# Patient Record
Sex: Female | Born: 1960 | Race: Black or African American | Hispanic: No | Marital: Married | Smoking: Never smoker
Health system: Southern US, Community
[De-identification: ages and names within clinical notes are randomized; demographics above are authoritative.]

## PROBLEM LIST (undated history)

## (undated) DIAGNOSIS — D649 Anemia, unspecified: Secondary | ICD-10-CM

## (undated) DIAGNOSIS — A159 Respiratory tuberculosis unspecified: Secondary | ICD-10-CM

## (undated) DIAGNOSIS — J189 Pneumonia, unspecified organism: Secondary | ICD-10-CM

## (undated) DIAGNOSIS — F419 Anxiety disorder, unspecified: Secondary | ICD-10-CM

## (undated) DIAGNOSIS — I10 Essential (primary) hypertension: Secondary | ICD-10-CM

## (undated) DIAGNOSIS — M1990 Unspecified osteoarthritis, unspecified site: Secondary | ICD-10-CM

## (undated) HISTORY — PX: OTHER SURGICAL HISTORY: SHX169

## (undated) HISTORY — PX: ABDOMINAL HYSTERECTOMY: SHX81

## (undated) HISTORY — PX: REPLACEMENT TOTAL KNEE: SUR1224

## (undated) HISTORY — PX: TUBAL LIGATION: SHX77

## (undated) HISTORY — DX: Essential (primary) hypertension: I10

---

## 2000-02-04 ENCOUNTER — Other Ambulatory Visit: Admission: RE | Admit: 2000-02-04 | Discharge: 2000-02-04 | Payer: Self-pay | Admitting: Unknown Physician Specialty

## 2001-01-19 DIAGNOSIS — I252 Old myocardial infarction: Secondary | ICD-10-CM

## 2001-05-02 ENCOUNTER — Encounter: Admission: RE | Admit: 2001-05-02 | Discharge: 2001-05-02 | Payer: Self-pay | Admitting: Internal Medicine

## 2001-05-02 ENCOUNTER — Encounter: Payer: Self-pay | Admitting: Internal Medicine

## 2003-11-08 ENCOUNTER — Ambulatory Visit: Payer: Self-pay | Admitting: Family Medicine

## 2003-12-20 LAB — CONVERTED CEMR LAB: Pap Smear: NORMAL

## 2004-01-20 HISTORY — PX: PARTIAL HYSTERECTOMY: SHX80

## 2004-02-04 ENCOUNTER — Inpatient Hospital Stay (HOSPITAL_COMMUNITY): Admission: RE | Admit: 2004-02-04 | Discharge: 2004-02-07 | Payer: Self-pay | Admitting: Obstetrics and Gynecology

## 2004-02-04 ENCOUNTER — Encounter (INDEPENDENT_AMBULATORY_CARE_PROVIDER_SITE_OTHER): Payer: Self-pay | Admitting: Specialist

## 2004-07-01 ENCOUNTER — Ambulatory Visit: Payer: Self-pay | Admitting: Family Medicine

## 2004-12-15 ENCOUNTER — Ambulatory Visit: Payer: Self-pay | Admitting: Family Medicine

## 2004-12-16 ENCOUNTER — Ambulatory Visit: Payer: Self-pay | Admitting: Family Medicine

## 2005-01-26 ENCOUNTER — Ambulatory Visit: Payer: Self-pay | Admitting: Family Medicine

## 2005-02-10 ENCOUNTER — Ambulatory Visit: Payer: Self-pay | Admitting: Family Medicine

## 2005-02-17 ENCOUNTER — Ambulatory Visit: Payer: Self-pay | Admitting: Family Medicine

## 2005-02-24 ENCOUNTER — Ambulatory Visit: Payer: Self-pay | Admitting: Internal Medicine

## 2006-09-06 ENCOUNTER — Encounter: Payer: Self-pay | Admitting: Internal Medicine

## 2006-09-06 DIAGNOSIS — I10 Essential (primary) hypertension: Secondary | ICD-10-CM | POA: Insufficient documentation

## 2006-09-29 ENCOUNTER — Ambulatory Visit: Payer: Self-pay | Admitting: Family Medicine

## 2006-09-29 ENCOUNTER — Encounter (INDEPENDENT_AMBULATORY_CARE_PROVIDER_SITE_OTHER): Payer: Self-pay | Admitting: Internal Medicine

## 2006-09-29 DIAGNOSIS — M2550 Pain in unspecified joint: Secondary | ICD-10-CM

## 2006-10-07 LAB — CONVERTED CEMR LAB
BUN: 11 mg/dL (ref 6–23)
Creatinine, Ser: 0.8 mg/dL (ref 0.4–1.2)
GFR calc non Af Amer: 82 mL/min
HCT: 34.9 % — ABNORMAL LOW (ref 36.0–46.0)
Hemoglobin: 11.6 g/dL — ABNORMAL LOW (ref 12.0–15.0)
MCHC: 33.2 g/dL (ref 30.0–36.0)
MCV: 74.2 fL — ABNORMAL LOW (ref 78.0–100.0)
Neutrophils Relative %: 68.7 % (ref 43.0–77.0)
Potassium: 4 meq/L (ref 3.5–5.1)
RDW: 13.6 % (ref 11.5–14.6)
Rhuematoid fact SerPl-aCnc: 70 intl units/mL — ABNORMAL HIGH (ref 0.0–20.0)
Sed Rate: 10 mm/hr (ref 0–25)

## 2006-10-11 ENCOUNTER — Ambulatory Visit: Payer: Self-pay | Admitting: Family Medicine

## 2006-10-12 LAB — CONVERTED CEMR LAB: Anti Nuclear Antibody(ANA): NEGATIVE

## 2006-10-26 ENCOUNTER — Encounter (INDEPENDENT_AMBULATORY_CARE_PROVIDER_SITE_OTHER): Payer: Self-pay | Admitting: Internal Medicine

## 2006-11-11 ENCOUNTER — Ambulatory Visit: Payer: Self-pay | Admitting: Family Medicine

## 2007-04-26 ENCOUNTER — Ambulatory Visit: Payer: Self-pay | Admitting: Family Medicine

## 2007-05-02 LAB — CONVERTED CEMR LAB
CO2: 28 meq/L (ref 19–32)
Cholesterol: 239 mg/dL (ref 0–200)
Direct LDL: 160.9 mg/dL
Total CHOL/HDL Ratio: 3.3

## 2007-05-24 ENCOUNTER — Encounter: Admission: RE | Admit: 2007-05-24 | Discharge: 2007-05-24 | Payer: Self-pay | Admitting: Family Medicine

## 2007-05-26 ENCOUNTER — Encounter (INDEPENDENT_AMBULATORY_CARE_PROVIDER_SITE_OTHER): Payer: Self-pay | Admitting: *Deleted

## 2007-05-26 ENCOUNTER — Encounter (INDEPENDENT_AMBULATORY_CARE_PROVIDER_SITE_OTHER): Payer: Self-pay | Admitting: Internal Medicine

## 2007-07-28 ENCOUNTER — Other Ambulatory Visit: Admission: RE | Admit: 2007-07-28 | Discharge: 2007-07-28 | Payer: Self-pay | Admitting: Family Medicine

## 2007-07-28 ENCOUNTER — Encounter (INDEPENDENT_AMBULATORY_CARE_PROVIDER_SITE_OTHER): Payer: Self-pay | Admitting: Internal Medicine

## 2007-07-28 ENCOUNTER — Ambulatory Visit: Payer: Self-pay | Admitting: Family Medicine

## 2007-07-28 LAB — CONVERTED CEMR LAB: Pap Smear: NORMAL

## 2007-08-03 ENCOUNTER — Encounter (INDEPENDENT_AMBULATORY_CARE_PROVIDER_SITE_OTHER): Payer: Self-pay | Admitting: *Deleted

## 2007-08-03 ENCOUNTER — Encounter (INDEPENDENT_AMBULATORY_CARE_PROVIDER_SITE_OTHER): Payer: Self-pay | Admitting: Internal Medicine

## 2008-01-18 ENCOUNTER — Encounter (INDEPENDENT_AMBULATORY_CARE_PROVIDER_SITE_OTHER): Payer: Self-pay | Admitting: *Deleted

## 2008-01-24 ENCOUNTER — Ambulatory Visit: Payer: Self-pay | Admitting: Family Medicine

## 2008-01-24 DIAGNOSIS — E785 Hyperlipidemia, unspecified: Secondary | ICD-10-CM | POA: Insufficient documentation

## 2008-01-27 ENCOUNTER — Ambulatory Visit: Payer: Self-pay | Admitting: Family Medicine

## 2008-02-03 LAB — CONVERTED CEMR LAB
ALT: 16 units/L (ref 0–35)
AST: 20 units/L (ref 0–37)
Albumin: 3.9 g/dL (ref 3.5–5.2)
Alkaline Phosphatase: 45 units/L (ref 39–117)
BUN: 15 mg/dL (ref 6–23)
CO2: 27 meq/L (ref 19–32)
Chloride: 107 meq/L (ref 96–112)
Cholesterol: 231 mg/dL (ref 0–200)
Creatinine, Ser: 0.8 mg/dL (ref 0.4–1.2)
Direct LDL: 139.1 mg/dL
Glucose, Bld: 83 mg/dL (ref 70–99)
Total Protein: 7.3 g/dL (ref 6.0–8.3)
VLDL: 11 mg/dL (ref 0–40)

## 2008-04-26 ENCOUNTER — Ambulatory Visit: Payer: Self-pay | Admitting: Family Medicine

## 2008-08-09 ENCOUNTER — Ambulatory Visit: Payer: Self-pay | Admitting: Family Medicine

## 2008-08-09 DIAGNOSIS — R635 Abnormal weight gain: Secondary | ICD-10-CM | POA: Insufficient documentation

## 2008-08-09 LAB — CONVERTED CEMR LAB
BUN: 14 mg/dL (ref 6–23)
Chloride: 108 meq/L (ref 96–112)
Glucose, Bld: 87 mg/dL (ref 70–99)
Potassium: 3.6 meq/L (ref 3.5–5.1)
Sodium: 143 meq/L (ref 135–145)

## 2008-10-04 ENCOUNTER — Ambulatory Visit: Payer: Self-pay | Admitting: Family Medicine

## 2009-03-28 ENCOUNTER — Ambulatory Visit: Payer: Self-pay | Admitting: Family Medicine

## 2009-03-28 DIAGNOSIS — Z780 Asymptomatic menopausal state: Secondary | ICD-10-CM | POA: Insufficient documentation

## 2009-03-28 LAB — CONVERTED CEMR LAB
BUN: 15 mg/dL (ref 6–23)
Calcium: 9.2 mg/dL (ref 8.4–10.5)
Direct LDL: 128.8 mg/dL
GFR calc non Af Amer: 114.29 mL/min (ref >60)
Glucose, Bld: 94 mg/dL (ref 70–99)
HDL: 87.7 mg/dL (ref >39.00)
Potassium: 3.6 meq/L (ref 3.5–5.1)
Sodium: 142 meq/L (ref 135–145)
Triglycerides: 36 mg/dL (ref 0.0–149.0)
VLDL: 7.2 mg/dL (ref 0.0–40.0)

## 2009-04-04 ENCOUNTER — Encounter: Payer: Self-pay | Admitting: Family Medicine

## 2009-04-05 ENCOUNTER — Ambulatory Visit: Payer: Self-pay | Admitting: Family Medicine

## 2009-04-05 ENCOUNTER — Ambulatory Visit: Payer: Self-pay | Admitting: Cardiovascular Disease

## 2009-04-05 DIAGNOSIS — Z862 Personal history of diseases of the blood and blood-forming organs and certain disorders involving the immune mechanism: Secondary | ICD-10-CM

## 2009-04-05 DIAGNOSIS — R002 Palpitations: Secondary | ICD-10-CM

## 2009-04-05 DIAGNOSIS — R0602 Shortness of breath: Secondary | ICD-10-CM

## 2009-04-05 DIAGNOSIS — R5381 Other malaise: Secondary | ICD-10-CM | POA: Insufficient documentation

## 2009-04-05 DIAGNOSIS — R5383 Other fatigue: Secondary | ICD-10-CM

## 2009-04-05 LAB — CONVERTED CEMR LAB
Iron: 49 ug/dL (ref 42–145)
Transferrin: 209.1 mg/dL — ABNORMAL LOW (ref 212.0–360.0)

## 2009-04-10 LAB — CONVERTED CEMR LAB
Basophils Absolute: 0 10*3/uL (ref 0.0–0.1)
Hemoglobin: 11.4 g/dL — ABNORMAL LOW (ref 12.0–15.0)
Lymphocytes Relative: 19.9 % (ref 12.0–46.0)
Monocytes Relative: 11 % (ref 3.0–12.0)
Neutro Abs: 5 10*3/uL (ref 1.4–7.7)
RBC: 4.76 M/uL (ref 3.87–5.11)
RDW: 13.3 % (ref 11.5–14.6)
WBC: 7.8 10*3/uL (ref 4.5–10.5)

## 2009-07-09 ENCOUNTER — Ambulatory Visit: Payer: Self-pay | Admitting: Family Medicine

## 2009-07-10 LAB — CONVERTED CEMR LAB
Basophils Relative: 0.7 % (ref 0.0–3.0)
Eosinophils Absolute: 0.3 10*3/uL (ref 0.0–0.7)
Hemoglobin: 11.6 g/dL — ABNORMAL LOW (ref 12.0–15.0)
Lymphocytes Relative: 17.8 % (ref 12.0–46.0)
MCHC: 31.6 g/dL (ref 30.0–36.0)
Monocytes Relative: 8 % (ref 3.0–12.0)
Neutro Abs: 6.2 10*3/uL (ref 1.4–7.7)
RBC: 4.82 M/uL (ref 3.87–5.11)

## 2009-07-29 ENCOUNTER — Ambulatory Visit: Payer: Self-pay | Admitting: Family Medicine

## 2010-01-23 ENCOUNTER — Ambulatory Visit
Admission: RE | Admit: 2010-01-23 | Discharge: 2010-01-23 | Payer: Self-pay | Source: Home / Self Care | Attending: Family Medicine | Admitting: Family Medicine

## 2010-01-23 ENCOUNTER — Other Ambulatory Visit: Payer: Self-pay | Admitting: Family Medicine

## 2010-01-23 ENCOUNTER — Encounter: Payer: Self-pay | Admitting: Internal Medicine

## 2010-01-23 ENCOUNTER — Other Ambulatory Visit
Admission: RE | Admit: 2010-01-23 | Discharge: 2010-01-23 | Payer: Self-pay | Source: Home / Self Care | Admitting: Family Medicine

## 2010-01-23 LAB — CBC WITH DIFFERENTIAL/PLATELET
Basophils Absolute: 0 10*3/uL (ref 0.0–0.1)
Basophils Relative: 0.6 % (ref 0.0–3.0)
Eosinophils Absolute: 0.2 10*3/uL (ref 0.0–0.7)
Eosinophils Relative: 3 % (ref 0.0–5.0)
HCT: 36.4 % (ref 36.0–46.0)
Hemoglobin: 11.5 g/dL — ABNORMAL LOW (ref 12.0–15.0)
Lymphocytes Relative: 19.7 % (ref 12.0–46.0)
Lymphs Abs: 1.6 10*3/uL (ref 0.7–4.0)
MCHC: 31.7 g/dL (ref 30.0–36.0)
MCV: 75.7 fl — ABNORMAL LOW (ref 78.0–100.0)
Monocytes Absolute: 0.8 10*3/uL (ref 0.1–1.0)
Monocytes Relative: 10.3 % (ref 3.0–12.0)
Neutro Abs: 5.3 10*3/uL (ref 1.4–7.7)
Neutrophils Relative %: 66.4 % (ref 43.0–77.0)
Platelets: 227 10*3/uL (ref 150.0–400.0)
RBC: 4.81 Mil/uL (ref 3.87–5.11)
RDW: 14.6 % (ref 11.5–14.6)
WBC: 8 10*3/uL (ref 4.5–10.5)

## 2010-01-23 LAB — BASIC METABOLIC PANEL
BUN: 17 mg/dL (ref 6–23)
CO2: 29 mEq/L (ref 19–32)
Calcium: 9.5 mg/dL (ref 8.4–10.5)
Chloride: 100 mEq/L (ref 96–112)
Creatinine, Ser: 0.9 mg/dL (ref 0.4–1.2)
GFR: 86.34 mL/min (ref >60.00)
Glucose, Bld: 86 mg/dL (ref 70–99)
Potassium: 3.5 mEq/L (ref 3.5–5.1)
Sodium: 138 mEq/L (ref 135–145)

## 2010-01-23 LAB — LIPID PANEL
Cholesterol: 255 mg/dL — ABNORMAL HIGH (ref 0–200)
HDL: 76.7 mg/dL (ref >39.00)
Total CHOL/HDL Ratio: 3
Triglycerides: 55 mg/dL (ref 0.0–149.0)
VLDL: 11 mg/dL (ref 0.0–40.0)

## 2010-01-23 LAB — HEPATIC FUNCTION PANEL
ALT: 22 U/L (ref 0–35)
AST: 19 U/L (ref 0–37)
Albumin: 3.7 g/dL (ref 3.5–5.2)
Alkaline Phosphatase: 48 U/L (ref 39–117)
Bilirubin, Direct: 0.1 mg/dL (ref 0.0–0.3)
Total Bilirubin: 0.7 mg/dL (ref 0.3–1.2)
Total Protein: 6.7 g/dL (ref 6.0–8.3)

## 2010-01-23 LAB — LDL CHOLESTEROL, DIRECT: Direct LDL: 158.1 mg/dL

## 2010-01-30 ENCOUNTER — Encounter
Admission: RE | Admit: 2010-01-30 | Discharge: 2010-01-30 | Payer: Self-pay | Source: Home / Self Care | Attending: Family Medicine | Admitting: Family Medicine

## 2010-01-31 ENCOUNTER — Encounter: Payer: Self-pay | Admitting: Family Medicine

## 2010-02-18 NOTE — Assessment & Plan Note (Signed)
Summary: NP6/AMD   Visit Type:  New Patient Referring Provider:  Dayton Martes Primary Provider:  Ruthe Mannan, MD  CC:  minute chest pains, lots of indigestion, increased shortness of breath, and no edema in akles and feet.Marland Kitchen  History of Present Illness: Erin Bass is a 50 year old woman with history of hypertension, hyperlipidemia, remote episode of chest pain when she was age 37 at which time she did not seek medical attention, who presents by referral for evaluation of recent shortness of breath and palpitations.  This Skillman states that recently she has had some shortness of breath when she exerts herself such as when she goes upstairs or tries to walk too fast. She also has some extra beats sometimes they are slow, other times a little but rapid. He seemed to be short episodes and not always associated with exertion. She had headaches, eyes are red and wonders if some of this could be from menopause.  She has been sleeping well but does get woken with her headaches. She's had no real episodes of chest discomfort with exertion. No lower extremity edema, no lightheadedness or near syncope.  Current Problems (verified): 1)  Postmenopausal Status  (ICD-V49.81) 2)  Weight Gain  (ICD-783.1) 3)  Hyperlipidemia  (ICD-272.4) 4)  Health Screening  (ICD-V70.0) 5)  Pain in Joint, Multiple Sites  (ICD-719.49) 6)  Hx, Family, Ischemic Heart Disease  (ICD-V17.3) 7)  Hx, Family, Sudden Cardiac Death  (ICD-V17.41) 8)  Hypertension  (ICD-401.9) 9)  Myocardial Infarction, Hx of  (ICD-412)  Current Medications (verified): 1)  Bl Essential One Daily   Tabs (Multiple Vitamin) .... Take 1 Tablet By Mouth Once A Day 2)  Cvs Iron 325 (65 Fe) Mg  Tabs (Ferrous Sulfate) .... Take 1 Tablet By Mouth Once A Day (Sometimes) 3)  Dyazide 37.5-25 Mg  Caps (Triamterene-Hctz) .... Take 2 By Mouth Every Am For Bp 4)  Calcium Carbonate-Vitamin D 600-400 Mg-Unit  Tabs (Calcium Carbonate-Vitamin D) .... Take 2 Per Day 5)  Simvastatin  10 Mg Tabs (Simvastatin) .Marland Kitchen.. 1 By Mouth At Bedtime  Allergies (verified): No Known Drug Allergies  Past History:  Past Surgical History: Last updated: 09/06/2006  hysterectomy, partial-- fibroids-- ovaries left in-- 1/06 2 c' sections removal bone spur  Family History: Last updated: 09/29/2006 Father: living 69--CAD/bypass, depression, ETOH abuse Mother: living 66--DM, HBP Siblings: 2 br---1 died age 49--massive MI, taking Phen-Phen, depression                          1--L&W               1 sis--45--DM, depression  CVA--PGM DM--MGM, Maunt and uncle Ca--none depression--both sides ETOH/drugs--no  Social History: Last updated: 09/29/2006 Marital Status: Married Children: 2--1 in home 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  The patient denies fatigue, malaise, fever, weight gain/loss, vision loss, decreased hearing, hoarseness, chest pain, palpitations, shortness of breath, prolonged cough, wheezing, sleep apnea, coughing up blood, abdominal pain, blood in stool, nausea, vomiting, diarrhea, heartburn, incontinence, blood in urine, muscle weakness, joint pain, leg swelling, rash, skin lesions, headache, fainting, dizziness, depression, anxiety, enlarged lymph nodes, easy bruising or bleeding, and environmental allergies.  Palpitations  Vital Signs:  Patient profile:   50 year old female Height:      61 inches Weight:      176.75 pounds BMI:     33.52 Pulse rate:   80 / minute Pulse rhythm:   regular BP sitting:   120 / 92  (left arm) Cuff size:   large  Vitals Entered By: Erin Bass (April 05, 2009 11:46 AM)  Physical  Exam  General:  Young woman  in no apparent distress, neck is supple with no JVP or carotid bruits, heart sounds are regular with normal S1-S2 and no murmurs appreciated, lungs are clear to auscultation with no wheezes or rales, abdominal exam notable for moderate obesity but otherwise benign, no significant lower extremity edema, neurologic exam grossly nonfocal, pulses equal and symmetrical in his upper and lower extremities, skin is warm and dry    EKG  Procedure date:  04/04/2009  Findings:      EKG dated 04/04/09 shows normal sinus rhythm with left axis deviation, poor R-wave progression through the precordial leads.  Impression & Recommendations:  Problem # 1:  DYSPNEA (ICD-786.05) Etiology of her shortness of breath is uncertain. She has mild obesity, and may also be deconditioned. Her symptoms do not seem to be new and are mild. Her exam is essentially benign with no signs of heart failure, no valvular abnormalities auscultated. There is no significant chest discomfort or tightness on history.  She does mention having a heart attack though there is no documentation to support this. She states having any episode of chest pain that lasted for several minutes when she was age 68 though she did not see a physician at that time. She reports having an echo and a stress test and per her report a CT scan done at [facility] many years ago though these are not documented in chart.  we talked to her about the various options for management including medical management, echocardiography and stress testing. She will try low-dose metoprolol for her shortness of breath and palpitations. She would also try to start exercising to improve her conditioning. If she has worsening shortness of breath or any other symptoms, have asked her to contact me as we would order a stress test, and possibly an echocardiogram to rule out pulmonary hypertension.  We have suggested that she start taking aspirin 81 mg  daily. We will  start her on low-dose metoprolol as detailed below.  Her updated medication list for this problem includes:    Dyazide 37.5-25 Mg Caps (Triamterene-hctz) .Marland Kitchen... Take 2 by mouth every am for bp    Metoprolol Tartrate 25 Mg Tabs (Metoprolol tartrate) .Marland Kitchen... Take one tablet by mouth twice a day    Aspirin 81 Mg Tbec (Aspirin) .Marland Kitchen... Take one tablet by mouth daily  Problem # 2:  PALPITATIONS (ICD-785.1) For her  palpitations and shortness of breath, we have suggested that she try a low-dose beta blocker. Her heart rate was elevated when seen in Dr. Elmer Sow office. Mildly elevated on her visit today. We have suggested that she try metoprolol 12.5 mg b.i.d. titrating up to 25 mg b.i.d. needed for heart rates or for improved blood pressure control.  Her updated medication list for this problem includes:    Metoprolol Tartrate 25 Mg Tabs (Metoprolol tartrate) .Marland Kitchen... Take one tablet by mouth twice a day    Aspirin 81 Mg Tbec (Aspirin) .Marland Kitchen... Take one tablet by mouth daily  Problem # 3:  HYPERLIPIDEMIA (ICD-272.4) Recently started  on simvastatin and she is not pick this up yet.  Her updated medication list for this problem includes:    Simvastatin 10 Mg Tabs (Simvastatin) .Marland Kitchen... 1 by mouth at bedtime  Problem # 4:  HYPERTENSION (ICD-401.9) diastolic pressure is borderline elevated. I hope that this will improve on low-dose metoprolol. Have asked her to monitor her blood pressure as an outpatient.  Her updated medication list for this problem includes:    Dyazide 37.5-25 Mg Caps (Triamterene-hctz) .Marland Kitchen... Take 2 by mouth every am for bp    Metoprolol Tartrate 25 Mg Tabs (Metoprolol tartrate) .Marland Kitchen... Take one tablet by mouth twice a day    Aspirin 81 Mg Tbec (Aspirin) .Marland Kitchen... Take one tablet by mouth daily  Patient Instructions: 1)  Your physician recommends that you schedule a follow-up appointment in 3 months. 2)  Your physician has recommended you make the following change in your medication:  metoprolol 25 mg twice a day, aspirin 81 mg daily Prescriptions: METOPROLOL TARTRATE 25 MG TABS (METOPROLOL TARTRATE) Take one tablet by mouth twice a day  #60 x 6   Entered by:   Charlena Cross, RN, BSN   Authorized by:   Dossie Arbour MD   Signed by:   Charlena Cross, RN, BSN on 04/05/2009   Method used:   Electronically to        [facility] (retail)       [address]       [place], [place]  [address]       Ph: [phone]       Fax: [phone]   RxID:   [id number]

## 2010-02-18 NOTE — Progress Notes (Signed)
Summary: PHI  PHI   Imported By: Harlon Flor 04/09/2009 11:31:48  _____________________________________________________________________  External Attachment:    Type:   Image     Comment:   External Document

## 2010-02-18 NOTE — Assessment & Plan Note (Signed)
Summary: 30 MIN F/U LABWORK,HA/BILLIE'S PT/CLECLE   Vital Signs:  Patient profile:   50 year old female Height:      61 inches Weight:      177.38 pounds BMI:     33.64 Temp:     98.1 degrees F oral Pulse rate:   92 / minute Pulse rhythm:   regular BP sitting:   132 / 96  (left arm) Cuff size:   regular  Vitals Entered By: Delilah Shan CMA Duncan Dull) (April 05, 2009 10:01 AM) CC: fatigue   History of Present Illness: 50 yo female new to me here for faitgue.  Over last month, feels more fatigued.  Noticed that she is short of breath climbing stairs, which never used to happen.  Often is aware of her heartbeat, feels a little dizzy.  No syncope or presyncope.  Over the month, has had short episodes of chest tightness, occuring at rest that resolve on own.  Sometimes diaphoretic but she is menopausal so felt they were just hot flashes.   No LE edema.  Does take Iron regularly, told she has iron defiiency anemia (last CBC 9/08 microcytic anemia).    PMH:  Had an MI at age 44, not been following with cardiologist as advised. Strong FH of CAD- brother died at 62 of massive MI, dad had multiple MIs (age unknown), grandmother died of MI.    HLD- LDL increased recently.  I was unaware of her prior MI when I gave her test results.  Given persional and family h/o MI,  lipids are not at goal.    Reviewed old records.  Current Medications (verified): 1)  Bl Essential One Daily   Tabs (Multiple Vitamin) .... Take 1 Tablet By Mouth Once A Day 2)  Cvs Iron 325 (65 Fe) Mg  Tabs (Ferrous Sulfate) .... Take 1 Tablet By Mouth Once A Day (Sometimes) 3)  Dyazide 37.5-25 Mg  Caps (Triamterene-Hctz) .... Take 2 By Mouth Every Am For Bp 4)  Calcium Carbonate-Vitamin D 600-400 Mg-Unit  Tabs (Calcium Carbonate-Vitamin D) .... Take 2 Per Day  Allergies (verified): No Known Drug Allergies  Review of Systems      See HPI General:  Complains of fatigue and malaise. Eyes:  Denies blurring. CV:  Complains  of chest pain or discomfort, fatigue, palpitations, and shortness of breath with exertion; denies fainting, leg cramps with exertion, near fainting, swelling of feet, and swelling of hands. Resp:  Denies shortness of breath. Neuro:  Complains of headaches; denies seizures, sensation of room spinning, tingling, tremors, visual disturbances, and weakness.  Physical Exam  General:  alert, well-developed, well-nourished, and well-hydrated.   Mouth:  MMM Lungs:  normal respiratory effort, no intercostal retractions, no accessory muscle use, and normal breath sounds.   Heart:  normal rate, regular rhythm, and no murmur.   Abdomen:  soft, non-tender, normal bowel sounds, no distention, no masses, no guarding, no abdominal hernia, no inguinal hernia, no hepatomegaly, and no splenomegaly.   Extremities:  no edema Neurologic:  alert & oriented X3, cranial nerves II-XII intact, and strength normal in all extremities.   Psych:  normally interactive and good eye contact.     Impression & Recommendations:  Problem # 1:  FATIGUE (ICD-780.79) Assessment New Likely multifactorial.  DOE and fatigue may be due to worsening anemia, will check CBC, TSH, and anemia panel. Given her own personal and family h/o heart disease, cannot rule out a cardiac etiology. EKG showed some non specific changes as well.  Will refer to cards for work up, possible holter monitor, echo and stress test.   Orders: Venipuncture (16109) TLB-CBC Platelet - w/Differential (85025-CBCD) TLB-TSH (Thyroid Stimulating Hormone) (84443-TSH) EKG w/ Interpretation (93000)  Problem # 2:  HYPERLIPIDEMIA (ICD-272.4) Assessment: Deteriorated  Given personal history cardiac ischemia, will start on Statin today.   Her updated medication list for this problem includes:    Simvastatin 10 Mg Tabs (Simvastatin) .Marland Kitchen... 1 by mouth at bedtime  Problem # 3:  HYPERTENSION (ICD-401.9) Assessment: Deteriorated Rushed to get her today, but will  continue to monitor. Her updated medication list for this problem includes:    Dyazide 37.5-25 Mg Caps (Triamterene-hctz) .Marland Kitchen... Take 2 by mouth every am for bp  Complete Medication List: 1)  Bl Essential One Daily Tabs (Multiple vitamin) .... Take 1 tablet by mouth once a day 2)  Cvs Iron 325 (65 Fe) Mg Tabs (Ferrous sulfate) .... Take 1 tablet by mouth once a day (sometimes) 3)  Dyazide 37.5-25 Mg Caps (Triamterene-hctz) .... Take 2 by mouth every am for bp 4)  Calcium Carbonate-vitamin D 600-400 Mg-unit Tabs (Calcium carbonate-vitamin d) .... Take 2 per day 5)  Simvastatin 10 Mg Tabs (Simvastatin) .Marland Kitchen.. 1 by mouth at bedtime  Other Orders: TLB-IBC Pnl (Iron/FE;Transferrin) (83550-IBC) Cardiology Referral (Cardiology)  Patient Instructions: 1)  Great to meet you 2)  Please stop by to see Shirlee Limerick on your way out.  She will set up your cardiology appt. 3)  Please come see me in 3 months. Prescriptions: SIMVASTATIN 10 MG TABS (SIMVASTATIN) 1 by mouth at bedtime  #90 x 3   Entered and Authorized by:   Ruthe Mannan MD   Signed by:   Ruthe Mannan MD on 04/05/2009   Method used:   Electronically to        [facility] (retail)       [address]       [place], [place]  [address]       Ph: [phone]       Fax: [phone]   RxID:   [id number]   Current Allergies (reviewed today): No known allergies

## 2010-02-18 NOTE — Assessment & Plan Note (Signed)
Summary: FOLLOW UP / LFW   Vital Signs:  Patient profile:   50 year old female Height:      61 inches Weight:      178.50 pounds BMI:     33.85 Temp:     98.3 degrees F oral Pulse rate:   72 / minute Pulse rhythm:   regular BP sitting:   110 / 80  (left arm) Cuff size:   regular  Vitals Entered By: Linde Gillis CMA Duncan Dull) (July 29, 2009 7:57 AM) CC: 3 month follow up   History of Present Illness: 50 yo female here for 3 month follow up fatigue and palpitations.  Fatigue- much improved.  Hgb was 11.6 on 6/21 (increased from 11.3 in March).  Now taking her iron 325 mg daily regularly and feels much better.    We referred her to Dr. Mariah Milling in March for  episodes of chest tightness, occuring at rest that resolve on own and palpitations.  ?h/o MI.   EKG was within normal limits. Started her on Metoprolol 25 mg two times a day and she feels much better!  Has had no more palpitations or episodes of SOB. Diastolic BP improved as well, was 132/96, now 110/80.  Current Medications (verified): 1)  Bl Essential One Daily   Tabs (Multiple Vitamin) .... Take 1 Tablet By Mouth Once A Day 2)  Cvs Iron 325 (65 Fe) Mg  Tabs (Ferrous Sulfate) .... Take 1 Tablet By Mouth Once A Day (Sometimes) 3)  Dyazide 37.5-25 Mg  Caps (Triamterene-Hctz) .... Take 2 By Mouth Every Am For Bp 4)  Calcium Carbonate-Vitamin D 600-400 Mg-Unit  Tabs (Calcium Carbonate-Vitamin D) .... Take 2 Per Day 5)  Simvastatin 10 Mg Tabs (Simvastatin) .Marland Kitchen.. 1 By Mouth At Bedtime 6)  Metoprolol Tartrate 25 Mg Tabs (Metoprolol Tartrate) .... Take One Tablet By Mouth Twice A Day 7)  Aspirin 81 Mg Tbec (Aspirin) .... Take One Tablet By Mouth Daily  Allergies (verified): No Known Drug Allergies  Past History:  Past Medical History: Last updated: 04/05/2009 Hypertension  Past Surgical History: Last updated: 09/06/2006  hysterectomy, partial-- fibroids-- ovaries left in-- 1/06 2 c' sections removal bone spur  Family  History: Last updated: 09/29/2006 Father: living 69--CAD/bypass, depression, ETOH abuse Mother: living 66--DM, HBP Siblings: 2 br---1 died age 61--massive MI, taking Phen-Phen, depression                          1--L&W               1 sis--45--DM, depression  CVA--PGM DM--MGM, Maunt and uncle Ca--none depression--both sides ETOH/drugs--no  Social History: Last updated: 09/29/2006 Marital Status: Married Children: 2--1 in home 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 CV:  Denies chest pain or discomfort. Resp:  Denies shortness of breath. GI:  Denies abdominal pain and change in bowel habits.  Physical Exam  General:  alert, well-developed, well-nourished, and well-hydrated.   Eyes:  vision grossly intact, pupils equal, pupils round, and pupils reactive to light.   Ears:  R ear normal and L ear normal.   Lungs:  Normal respiratory effort, chest expands symmetrically. Lungs are clear to auscultation, no crackles or wheezes. Heart:  Normal rate  and regular rhythm. S1 and S2 normal without gallop, murmur, click, rub or other extra sounds. Extremities:  no edema Psych:  normally interactive and good eye contact.     Impression & Recommendations:  Problem # 1:  PALPITATIONS (ICD-785.1) Assessment Improved Improved with addition of Metoprolol.  Will continue to monitor.  If she has recurrent epsiodes, will consider Holter monitor. Her updated medication list for this problem includes:    Metoprolol Tartrate 25 Mg Tabs (Metoprolol tartrate) .Marland Kitchen... Take one tablet by mouth twice a day  Problem # 2:  ANEMIA, IRON DEFICIENCY, HX OF (ICD-V12.3) Assessment: Improved Continue iron.  Problem # 3:  HYPERTENSION  (ICD-401.9) Assessment: Improved Improved with Metoprolol. Her updated medication list for this problem includes:    Dyazide 37.5-25 Mg Caps (Triamterene-hctz) .Marland Kitchen... Take 2 by mouth every am for bp    Metoprolol Tartrate 25 Mg Tabs (Metoprolol tartrate) .Marland Kitchen... Take one tablet by mouth twice a day  Complete Medication List: 1)  Bl Essential One Daily Tabs (Multiple vitamin) .... Take 1 tablet by mouth once a day 2)  Cvs Iron 325 (65 Fe) Mg Tabs (Ferrous sulfate) .... Take 1 tablet by mouth once a day (sometimes) 3)  Dyazide 37.5-25 Mg Caps (Triamterene-hctz) .... Take 2 by mouth every am for bp 4)  Calcium Carbonate-vitamin D 600-400 Mg-unit Tabs (Calcium carbonate-vitamin d) .... Take 2 per day 5)  Simvastatin 10 Mg Tabs (Simvastatin) .Marland Kitchen.. 1 by mouth at bedtime 6)  Metoprolol Tartrate 25 Mg Tabs (Metoprolol tartrate) .... Take one tablet by mouth twice a day 7)  Aspirin 81 Mg Tbec (Aspirin) .... Take one tablet by mouth daily  Current Allergies (reviewed today): No known allergies

## 2010-02-20 NOTE — Assessment & Plan Note (Signed)
Summary: CPX   Vital Signs:  Patient profile:   50 year old Bass Height:      61 inches Weight:      178 pounds BMI:     33.75 Temp:     97.7 degrees F oral Pulse rate:   70 / minute Pulse rhythm:   regular BP sitting:   120 / 90  (left arm) Cuff size:   regular  Vitals Entered By: Linde Gillis CMA Duncan Dull) (January 23, 2010 8:10 AM) CC: CPX   History of Present Illness: 50 yo Bass here for CPX.    palpiations- We referred her to Dr. Mariah Milling last March for  episodes of chest tightness, occuring at rest that resolve on own and palpitations.  ?h/o MI.   EKG was within normal limits. Started her on Metoprolol 25 mg two times a day and she feels much better!  Well woman- s/p partial hysterectomy for fibroids 8 years ago. No complaints.   Occassional hot flashes, she can deal with it ok.      HLD- was on Simvasatin 10 mg daily, stopped taking it.  Feels her diet has improved.     Current Medications (verified): 1)  Cvs Iron 325 (65 Fe) Mg  Tabs (Ferrous Sulfate) .... Take 1 Tablet By Mouth Once A Day (Sometimes) 2)  Dyazide 37.5-25 Mg  Caps (Triamterene-Hctz) .... Take 2 By Mouth Every Am For Bp 3)  Calcium Carbonate-Vitamin D 600-400 Mg-Unit  Tabs (Calcium Carbonate-Vitamin D) .... Take 2 Per Day 4)  Metoprolol Tartrate 25 Mg Tabs (Metoprolol Tartrate) .... Take One Tablet By Mouth Twice A Day 5)  Aspirin 81 Mg Tbec (Aspirin) .... Take One Tablet By Mouth Daily  Allergies (verified): No Known Drug Allergies  Past History:  Past Medical History: Last updated: 04/05/2009 Hypertension  Past Surgical History: Last updated: 09/06/2006  hysterectomy, partial-- fibroids-- ovaries left in-- 1/06 2 c' sections removal bone spur  Family History: Last updated: 09/29/2006 Father: living 69--CAD/bypass, depression, ETOH abuse Mother: living 66--DM, HBP Siblings: 2 br---1 died age 21--massive MI, taking Phen-Phen, depression                          1--L&W               1  sis--45--DM, depression  CVA--PGM DM--MGM, Maunt and uncle Ca--none depression--both sides ETOH/drugs--no  Social History: Last updated: 09/29/2006 Marital Status: Married Children: 2--1 in home Occupation: rehab tech--sits with people [employer]  Risk Factors: Alcohol Use: <1 (09/29/2006) Caffeine Use: 1 (01/24/2008) Exercise: no (09/29/2006)  Risk Factors: Smoking Status: never (09/06/2006) Passive Smoke Exposure: no (07/28/2007)  Review of Systems      See HPI General:  Denies malaise. Eyes:  Denies blurring. ENT:  Denies difficulty swallowing. CV:  Denies chest pain or discomfort. Resp:  Denies shortness of breath. GI:  Denies abdominal pain, bloody stools, and change in bowel habits. GU:  Denies discharge and dysuria. MS:  Denies joint pain, joint redness, and joint swelling. Derm:  Denies rash. Neuro:  Denies headaches. Psych:  Denies anxiety and depression. Endo:  Denies cold intolerance and heat intolerance. Heme:  Denies abnormal bruising and bleeding.  Physical Exam  General:  alert, well-developed, well-nourished, and well-hydrated.   Head:  normocephalic and atraumatic.   Eyes:  vision grossly intact and pupils equal.   Ears:  R ear normal and L ear normal.   Nose:  no external deformity.  Mouth:  good dentition.   Neck:  No deformities, masses, or tenderness noted. Breasts:  No mass, nodules, thickening, tenderness, bulging, retraction, inflamation, nipple discharge or skin changes noted.   Lungs:  Normal respiratory effort, chest expands symmetrically. Lungs are clear to auscultation, no crackles or wheezes. Heart:  Normal rate and regular rhythm. S1 and S2 normal without gallop, murmur, click, rub or other extra sounds. Abdomen:  soft, non-tender, normal bowel sounds, no distention, no masses, no guarding, no abdominal hernia, no inguinal hernia, no hepatomegaly, and no splenomegaly.   Rectal:  no external abnormalities.   Genitalia:  Pelvic  Exam:        External: normal Bass genitalia without lesions or masses        Vagina: normal without lesions or masses        Cervix: absent        Adnexa: normal bimanual exam without masses or fullness        Uterus: absent        Pap smear: performed Msk:  normal ROM and no joint tenderness.   Pulses:  R and L carotid,radial,femoral,dorsalis pedis and posterior tibial pulses are full and equal bilaterally Extremities:  no edema Neurologic:  alert & oriented X3 and gait normal.   Skin:  Intact without suspicious lesions or rashes Psych:  Cognition and judgment appear intact. Alert and cooperative with normal attention span and concentration. No apparent delusions, illusions, hallucinations   Impression & Recommendations:  Problem # 1:  HEALTH SCREENING (ICD-V70.0) Reviewed preventive care protocols, scheduled due services, and updated immunizations Discussed nutrition, exercise, diet, and healthy lifestyle.  Order placed for mammogram and colonoscopy. pap today.  Problem # 2:  ANEMIA, IRON DEFICIENCY, HX OF (ICD-V12.3) recheck CBC today. Orders: TLB-CBC Platelet - w/Differential (85025-CBCD)  Problem # 3:  HYPERLIPIDEMIA (ICD-272.4) Assessment: Unchanged recheck lipids today. The following medications were removed from the medication list:    Simvastatin 10 Mg Tabs (Simvastatin) .Marland Kitchen... 1 by mouth at bedtime  Orders: Venipuncture (08657) TLB-Lipid Panel (80061-LIPID)  Complete Medication List: 1)  Cvs Iron 325 (65 Fe) Mg Tabs (Ferrous sulfate) .... Take 1 tablet by mouth once a day (sometimes) 2)  Dyazide 37.5-25 Mg Caps (Triamterene-hctz) .... Take 2 by mouth every am for bp 3)  Calcium Carbonate-vitamin D 600-400 Mg-unit Tabs (Calcium carbonate-vitamin d) .... Take 2 per day 4)  Metoprolol Tartrate 25 Mg Tabs (Metoprolol tartrate) .... Take one tablet by mouth twice a day 5)  Aspirin 81 Mg Tbec (Aspirin) .... Take one tablet by mouth daily  Other  Orders: Gastroenterology Referral (GI) Radiology Referral (Radiology) TLB-BMP (Basic Metabolic Panel-BMET) (80048-METABOL) TLB-Hepatic/Liver Function Pnl (80076-HEPATIC)  Patient Instructions: 1)  Great to see you. 2)  Please stop by to see Shirlee Limerick on your way out. Prescriptions: METOPROLOL TARTRATE 25 MG TABS (METOPROLOL TARTRATE) Take one tablet by mouth twice a day  #60 x 6   Entered and Authorized by:   Ruthe Mannan MD   Signed by:   Ruthe Mannan MD on 01/23/2010   Method used:   Electronically to        [facility] (retail)       [address]       [place], [place]  [address]       Ph: [phone]       Fax: [phone]   RxID:   [id number]    Orders Added: 1)  Gastroenterology Referral [GI]  2)  Radiology Referral [Radiology] 3)  Venipuncture [36415] 4)  TLB-Lipid Panel [80061-LIPID] 5)  TLB-BMP (Basic Metabolic Panel-BMET) [80048-METABOL] 6)  TLB-Hepatic/Liver Function Pnl [80076-HEPATIC] 7)  TLB-CBC Platelet - w/Differential [85025-CBCD] 8)  Est. Patient 40-Erin years [06301]    Current Allergies (reviewed today): No known allergies     Prevention & Chronic Care Immunizations   Influenza vaccine: Not documented   Influenza vaccine deferral: Not available  (01/23/2010)    Tetanus booster: 04/19/2004: Td   Tetanus booster due: 04/20/2014    Pneumococcal vaccine: Not documented  Colorectal Screening   Hemoccult: Not documented    Colonoscopy: Not documented   Colonoscopy action/deferral: GI referral  (01/23/2010)  Other Screening   Pap smear: normal  (07/28/2007)   Pap smear action/deferral: Ordered  (01/23/2010)   Pap smear due: 07/2008    Mammogram: normal  (05/26/2007)   Mammogram action/deferral: Ordered  (01/23/2010)   Mammogram due: 05/2008   Smoking status: never  (09/06/2006)  Lipids   Total Cholesterol: 219  (03/28/2009)   LDL: DEL  (01/27/2008)   LDL Direct: 128.8  (03/28/2009)   HDL: 87.70   (03/28/2009)   Triglycerides: 36.0  (03/28/2009)    SGOT (AST): 20  (01/27/2008)   SGPT (ALT): 16  (01/27/2008)   Alkaline phosphatase: 45  (01/27/2008)   Total bilirubin: 0.6  (01/27/2008)  Hypertension   Last Blood Pressure: 120 / 90  (01/23/2010)   Serum creatinine: 0.7  (03/28/2009)   Serum potassium 3.6  (03/28/2009)  Self-Management Support :    Hypertension self-management support: Not documented    Lipid self-management support: Not documented    Nursing Instructions: GI referral for screening colonoscopy (see order) Pap smear today Schedule screening mammogram (see order)

## 2010-02-20 NOTE — Letter (Signed)
Summary: Pre Visit Letter Revised  [facility]  [address] [place], [place] [address]   Phone: [phone]  Fax: [phone]        01/23/2010 MRN: [id number]  [facility] [address] [place], [place]  [address]             Procedure Date:  03-13-10 at 8:30am           Direct Colon - Dr Justin Mend to the Gastroenterology Division at [facility].    You are scheduled to see a nurse for your pre-procedure visit on 02-27-10 at 8:30am on the 3rd floor at [facility], [address]  We ask that you try to arrive at our office 15 minutes prior to your appointment time to allow for check-in.  Please take a minute to review the attached form.  If you answer "Yes" to one or more of the questions on the first page, we ask that you call the person listed at your earliest opportunity.  If you answer "No" to all of the questions, please complete the rest of the form and bring it to your appointment.    Your nurse visit will consist of discussing your medical and surgical history, your immediate family medical history, and your medications.   If you are unable to list all of your medications on the form, please bring the medication bottles to your appointment and we will list them.  We will need to be aware of both prescribed and over the counter drugs.  We will need to know exact dosage information as well.    Please be prepared to read and sign documents such as consent forms, a financial agreement, and acknowledgement forms.  If necessary, and with your consent, a friend or relative is welcome to sit-in on the nurse visit with you.  Please bring your insurance card so that we may make a copy of it.  If your insurance requires a referral to see a specialist, please bring your referral form from your primary care physician.  No co-pay is required for this nurse visit.     If you cannot keep your appointment, please call [phone] to cancel or  reschedule prior to your appointment date.  This allows [place] the opportunity to schedule an appointment for another patient in need of care.    Thank you for choosing [facility] for your medical needs.  We appreciate the opportunity to care for you.  Please visit [place] at our website  to learn more about our practice.  Sincerely, [facility]

## 2010-02-20 NOTE — Letter (Signed)
Summary: Results Follow up Letter  [place] [facility]  [address] [place], [place] [address]   Phone: [phone]  Fax: [phone]    01/31/2010 MRN: [id number]  [facility] [address] [place], [place]  [address]  Dear Ms. Belisle,  The following are the results of your recent test(s):  Test         Result    Pap Smear:        Normal __X___  Not Normal _____ Comments: Repeat in 2-3 years. ______________________________________________________ Cholesterol: LDL(Bad cholesterol):         Your goal is less than:         HDL (Good cholesterol):       Your goal is more than: Comments:  ______________________________________________________ Mammogram:        Normal _____  Not Normal _____ Comments:  ___________________________________________________________________ Hemoccult:        Normal _____  Not normal _______ Comments:    _____________________________________________________________________ Other Tests:    We routinely do not discuss normal results over the telephone.  If you desire a copy of the results, or you have any questions about this information we can discuss them at your next office visit.   Sincerely,      Dr. Ruthe Mannan

## 2010-02-26 ENCOUNTER — Encounter (INDEPENDENT_AMBULATORY_CARE_PROVIDER_SITE_OTHER): Payer: Self-pay | Admitting: *Deleted

## 2010-02-27 ENCOUNTER — Encounter: Payer: Self-pay | Admitting: Internal Medicine

## 2010-03-06 NOTE — Miscellaneous (Signed)
Summary: [facility]  Clinical Lists Changes  Medications: Added new medication of MOVIPREP 100 GM  SOLR (PEG-KCL-NACL-NASULF-NA ASC-C) As per prep instructions. - Signed Rx of MOVIPREP 100 GM  SOLR (PEG-KCL-NACL-NASULF-NA ASC-C) As per prep instructions.;  #1 x 0;  Signed;  Entered by: Ezra Sites RN;  Authorized by: Hilarie Fredrickson MD;  Method used: Electronically to [facility] [address] [place], [place], [place]  [address], Ph: [phone], Fax: [phone] Observations: Added new observation of NKA: T (02/27/2010 8:26)    Prescriptions: MOVIPREP 100 GM  SOLR (PEG-KCL-NACL-NASULF-NA ASC-C) As per prep instructions.  #1 x 0   Entered by:   Ezra Sites RN   Authorized by:   Hilarie Fredrickson MD   Signed by:   Ezra Sites RN on 02/27/2010   Method used:   Electronically to        [facility] (retail)       [address]       [place], [place]  [address]       Ph: [phone]       Fax: [phone]   RxID:   [id number]

## 2010-03-06 NOTE — Letter (Signed)
Summary: Moviprep Instructions  [facility]  [address]   [place], [place] [address]   Phone: [phone]  Fax: [phone]       Erin Bass    [birth year]    MRN: [id number]        Procedure Day Dorna Bloom: Thursday, 03-13-10     Arrival Time: 7:30 a.m.     Procedure Time: 8:30 a.m.     Location of Procedure:                    x   [facility] (4th Floor)                        PREPARATION FOR COLONOSCOPY WITH MOVIPREP   Starting 5 days prior to your procedure 03-08-10 do not eat nuts, seeds, popcorn, corn, beans, peas,  salads, or any raw vegetables.  Do not take any fiber supplements (e.g. Metamucil, Citrucel, and Benefiber).  THE DAY BEFORE YOUR PROCEDURE         DATE: 03-12-10 DAY: Wednesday  1.  Drink clear liquids the entire day-NO SOLID FOOD  2.  Do not drink anything colored red or purple.  Avoid juices with pulp.  No orange juice.  3.  Drink at least 64 oz. (8 glasses) of fluid/clear liquids during the day to prevent dehydration and help the prep work efficiently.  CLEAR LIQUIDS INCLUDE: Water Jello Ice Popsicles Tea (sugar ok, no milk/cream) Powdered fruit flavored drinks Coffee (sugar ok, no milk/cream) Gatorade Juice: apple, white grape, white cranberry  Lemonade Clear bullion, consomm, broth Carbonated beverages (any kind) Strained chicken noodle soup Hard Candy                             4.  In the morning, mix first dose of MoviPrep solution:    Empty 1 Pouch A and 1 Pouch B into the disposable container    Add lukewarm drinking water to the top line of the container. Mix to dissolve    Refrigerate (mixed solution should be used within 24 hrs)  5.  Begin drinking the prep at 5:00 p.m. The MoviPrep container is divided by 4 marks.   Every 15 minutes drink the solution down to the next mark (approximately 8 oz) until the full liter is complete.   6.  Follow completed prep with 16 oz of clear liquid of your choice  (Nothing red or purple).  Continue to drink clear liquids until bedtime.  7.  Before going to bed, mix second dose of MoviPrep solution:    Empty 1 Pouch A and 1 Pouch B into the disposable container    Add lukewarm drinking water to the top line of the container. Mix to dissolve    Refrigerate  THE DAY OF YOUR PROCEDURE      DATE: 03-13-10 DAY: Thursday  Beginning at 3:30 a.m. (5 hours before procedure):         1. Every 15 minutes, drink the solution down to the next mark (approx 8 oz) until the full liter is complete.  2. Follow completed prep with 16 oz. of clear liquid of your choice.    3. You may drink clear liquids until 6:30 a.m. (2 HOURS BEFORE PROCEDURE).   MEDICATION INSTRUCTIONS  Unless otherwise instructed, you should take regular prescription medications with a small sip of water   as early as possible the morning of your  procedure.   Additional medication instructions: Do not take Dyazide day of procedure.                                                         Stop taking Iron 5 days before the procedure.         OTHER INSTRUCTIONS  You will need a responsible adult at least 50 years of age to accompany you and drive you home.   This person must remain in the waiting room during your procedure.  Wear loose fitting clothing that is easily removed.  Leave jewelry and other valuables at home.  However, you may wish to bring a book to read or  an iPod/MP3 player to listen to music as you wait for your procedure to start.  Remove all body piercing jewelry and leave at home.  Total time from sign-in until discharge is approximately 2-3 hours.  You should go home directly after your procedure and rest.  You can resume normal activities the  day after your procedure.  The day of your procedure you should not:   Drive   Make legal decisions   Operate machinery   Drink alcohol   Return to work  You will receive specific instructions about eating,  activities and medications before you leave.    The above instructions have been reviewed and explained to me by   Ezra Sites RN  February 27, 2010 8:50 AM    I fully understand and can verbalize these instructions _____________________________ Date _________

## 2010-03-07 ENCOUNTER — Encounter: Payer: Self-pay | Admitting: Family Medicine

## 2010-03-07 ENCOUNTER — Other Ambulatory Visit: Payer: Self-pay | Admitting: Family Medicine

## 2010-03-07 ENCOUNTER — Ambulatory Visit (INDEPENDENT_AMBULATORY_CARE_PROVIDER_SITE_OTHER): Payer: BC Managed Care – PPO | Admitting: Family Medicine

## 2010-03-07 DIAGNOSIS — R42 Dizziness and giddiness: Secondary | ICD-10-CM

## 2010-03-07 DIAGNOSIS — R5381 Other malaise: Secondary | ICD-10-CM

## 2010-03-07 DIAGNOSIS — R5383 Other fatigue: Secondary | ICD-10-CM

## 2010-03-07 LAB — CONVERTED CEMR LAB
Hemoglobin: 12.1 g/dL (ref 12.0–15.0)
Lymphocytes Relative: 19 % (ref 12–46)
Lymphs Abs: 1.9 10*3/uL (ref 0.7–4.0)
Monocytes Absolute: 0.9 10*3/uL (ref 0.1–1.0)
Monocytes Relative: 9 % (ref 3–12)
Neutro Abs: 7.2 10*3/uL (ref 1.7–7.7)
RBC: 5.05 M/uL (ref 3.87–5.11)

## 2010-03-07 LAB — BASIC METABOLIC PANEL
BUN: 20 mg/dL (ref 6–23)
CO2: 27 mEq/L (ref 19–32)
Chloride: 102 mEq/L (ref 96–112)
Creatinine, Ser: 0.8 mg/dL (ref 0.4–1.2)
Glucose, Bld: 84 mg/dL (ref 70–99)

## 2010-03-12 NOTE — Assessment & Plan Note (Signed)
Summary: dizzy/alc   Vital Signs:  Patient profile:   50 year old female Height:      61 inches Weight:      180.50 pounds BMI:     34.23 Temp:     97.8 degrees F oral Pulse rate:   67 / minute Pulse rhythm:   regular BP sitting:   130 / 100  (left arm) BP standing:   118 / 90 Cuff size:   regular  Vitals Entered By: Linde Gillis CMA Duncan Dull) (March 07, 2010 10:26 AM)  Serial Vital Signs/Assessments:  Time      Position  BP       Pulse  Resp  Temp     By 10:57 AM  Lying LA  118/90                         Linde Gillis CMA (AAMA) 10:57 AM  Sitting   118/90                         Linde Gillis CMA (AAMA) 10:57 AM  Standing  118/90                         Linde Gillis CMA (AAMA)  CC: dizzy   History of Present Illness: 50 yo here for dizziness.    Woke up this morning and felt dizzy while she was drinking her coffee.  Was not brought about by changing positions but did feel worse when she stood up. Did not feel like room was spinning. No nausea, blurred vision, or syncope.    palpiations- We referred her to Dr. Mariah Milling last March for  episodes of chest tightness, occuring at rest that resolve on own and palpitations.  ?h/o MI.   EKG was within normal limits. Started her on Metoprolol 25 mg two times a day at that time. Also taking Dyzaide 37.5-25- 2 tabs by mouth daily.    EKG today- unchanged from prior- NSR, old T wave abnormality.    Current Medications (verified): 1)  Cvs Iron 325 (65 Fe) Mg  Tabs (Ferrous Sulfate) .... Take 1 Tablet By Mouth Once A Day (Sometimes) 2)  Dyazide 37.5-25 Mg  Caps (Triamterene-Hctz) .... Take 1 By Mouth Every Am For Bp 3)  Calcium Carbonate-Vitamin D 600-400 Mg-Unit  Tabs (Calcium Carbonate-Vitamin D) .... Take 2 Per Day 4)  Metoprolol Tartrate 25 Mg Tabs (Metoprolol Tartrate) .... Take One Tablet By Mouth At Night 5)  Aspirin 81 Mg Tbec (Aspirin) .... Take One Tablet By Mouth Daily 6)  Simvastatin 10 Mg Tabs (Simvastatin) .... Take One  Tab By Mouth Daily.  Allergies (verified): No Known Drug Allergies  Past History:  Past Medical History: Last updated: 04/05/2009 Hypertension  Past Surgical History: Last updated: 09/06/2006  hysterectomy, partial-- fibroids-- ovaries left in-- 1/06 2 c' sections removal bone spur  Family History: Last updated: 09/29/2006 Father: living 69--CAD/bypass, depression, ETOH abuse Mother: living 66--DM, HBP Siblings: 2 br---1 died age 62--massive MI, taking Phen-Phen, depression                          1--L&W               1 sis--45--DM, depression  CVA--PGM DM--MGM, Maunt and uncle Ca--none depression--both sides ETOH/drugs--no  Social History: Last updated: 09/29/2006 Marital Status: Married Children: 2--1 in home 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 Eyes:  Denies blurring. CV:  Denies chest pain or discomfort. Neuro:  Denies headaches.  Physical Exam  General:  alert, well-developed, well-nourished, and well-hydrated.   Lungs:  Normal respiratory effort, chest expands symmetrically. Lungs are clear to auscultation, no crackles or wheezes. Heart:  Normal rate and regular rhythm. S1 and S2 normal without gallop, murmur, click, rub or other extra sounds. Neurologic:  alert & oriented X3 and gait normal.   Psych:  Cognition and judgment appear intact. Alert and cooperative with normal attention span and concentration. No apparent delusions, illusions, hallucinations   Impression & Recommendations:  Problem # 1:  DIZZINESS (ICD-780.4) Assessment New ?possible overmedication with antihypertensives. Will decrease dyazide and metoprolol dose (see patient instructions). EKG unchanged  from prior, reassuring that she is not having a cardiac event. Check BMET, CBC, TSh to rule out other possible contributing factors.   Orders: Venipuncture ([address]) TLB-BMP (Basic Metabolic Panel-BMET) (80048-METABOL) EKG w/ Interpretation ([address])  Complete Medication List: 1)  Cvs Iron 325 (65 Fe) Mg Tabs (Ferrous sulfate) .... Take 1 tablet by mouth once a day (sometimes) 2)  Dyazide 37.5-25 Mg Caps (Triamterene-hctz) .... Take 1 by mouth every am for bp 3)  Calcium Carbonate-vitamin D 600-400 Mg-unit Tabs (Calcium carbonate-vitamin d) .... Take 2 per day 4)  Metoprolol Tartrate 25 Mg Tabs (Metoprolol tartrate) .... Take one tablet by mouth at night 5)  Aspirin 81 Mg Tbec (Aspirin) .... Take one tablet by mouth daily 6)  Simvastatin 10 Mg Tabs (Simvastatin) .... Take one tab by mouth daily.  Other Orders: TLB-CBC Platelet - w/Differential (85025-CBCD) TLB-TSH (Thyroid Stimulating Hormone) (84443-TSH)  Patient Instructions: 1)  Please decrease your Dyazide to 1 tablet every morning and your Metoprolol to 1 tablet at night.   2)  Call me next week with you blood pressure readings.     Orders Added: 1)  Venipuncture [36415] 2)  TLB-BMP (Basic Metabolic Panel-BMET) [80048-METABOL] 3)  TLB-CBC Platelet - w/Differential [85025-CBCD] 4)  TLB-TSH (Thyroid Stimulating Hormone) [84443-TSH] 5)  EKG w/ Interpretation [93000] 6)  Est. Patient Level IV [[address]]    Current Allergies (reviewed today): No known allergies

## 2010-03-13 ENCOUNTER — Other Ambulatory Visit: Payer: Self-pay | Admitting: Internal Medicine

## 2010-06-06 NOTE — Op Note (Signed)
Erin Bass, Erin Bass                ACCOUNT NO.:  [id number]   MEDICAL RECORD NO.:  [id number]          PATIENT TYPE:  INP   LOCATION:  X004                         FACILITY:  [facility]   PHYSICIAN:  Daniel L. Gottsegen, M.D.DATE OF BIRTH:  1960/08/18   DATE OF PROCEDURE:  02/04/2004  DATE OF DISCHARGE:                                 OPERATIVE REPORT   PREOPERATIVE DIAGNOSIS:  Large fibroids.   POSTOPERATIVE DIAGNOSES:  1.  Large fibroids.  2.  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   OPERATION:  Total abdominal hysterectomy and excision of left hydatid cyst.   SURGEON:  Dr. Eda Paschal   FIRST ASSISTANT:  Dr. Farrel Gobble   FINDINGS:  The patient's uterus was enlarged by multiple fibroids to a  approximately 24 weeks' size.  Ovaries and fallopian tubes were normal  except for a 2.5 cm hydatid cyst on her left fallopian tube.  There were  fairly significant adhesions from the patient's previous cesarean sections  at the vesicouterine fold to peritoneum.  Pelvic peritoneum was free of any  disease.   DESCRIPTION OF PROCEDURE:  After adequate general anesthesia, the patient  was placed in the supine position, prepped and draped in the usual sterile  manner.  A Foley catheter was inserted into her bladder.  A midline vertical  incision was made.  The fascia was opened vertically as was the peritoneum.  Subcutaneous bleeders were clamped and Bovie'd as encountered.  Once the  peritoneal cavity was opened, a cell saver was utilized which was consistent  with the patient's wishes.  The uterus was delivered.  The round ligaments  were clamped, cut, and suture ligated with #1 chromic catgut.  The ovaries  and tubes were separated from the uterus by clamping, cutting, and suture  ligating with #1 chromic catgut.  A bladder flap was advanced.  Dissections  had to be freed up from her previous C-sections, but this was possible.  The  uterine arteries were then clamped, cut, and doubly suture  ligated with #1  chromic catgut.  The cervix was taken down in successive bites by clamping,  cutting, and suture ligating with #1 chromic catgut.  The cervical vaginal  junction was identified, entered, and the uterus and cervix were sent to  pathology for tissue diagnosis.  Angled sutures were placed in the angles of  the vagina with #1 chromic catgut, incorporating the uterosacral ligaments,  cardinal ligaments for good vault support.  The vaginal cuff was closed with  figure-of-eights of 0 Vicryl.  Copious irrigation was done with Ringer's  lactate.  There was absolutely no bleeding noted.  There was not enough in  the cell saver to give back to the patient, so it was discarded.  Once two  sponge, needle, and instrument counts were correct, the peritoneum and the  fascia were closed with two running 0 PDS on a  double-armed needle.  Copious irrigation was done subcutaneously, and then  the skin was closed with staples.  Estimated blood loss for the entire  procedure was 500 mL with none replaced.  The patient tolerated  the  procedure well and left the operating room in satisfactory condition,  draining clear urine from her Foley catheter.     Dani   DLG/MEDQ  D:  02/04/2004  T:  02/04/2004  Job:  [id number]

## 2010-06-06 NOTE — Discharge Summary (Signed)
NAMESHAUNTEE, KARP                ACCOUNT NO.:  [id number]   MEDICAL RECORD NO.:  [id number]          PATIENT TYPE:  INP   LOCATION:  0484                         FACILITY:  [facility]   PHYSICIAN:  Daniel L. Gottsegen, M.D.DATE OF BIRTH:  1960/09/25   DATE OF ADMISSION:  02/04/2004  DATE OF DISCHARGE:  02/07/2004                                 DISCHARGE SUMMARY   The patient is a 50 year old female who is admitted to the hospital with an  extremely large fibroid uterus for definitive surgery.  On the day of  admission, she was taken to the operating room and a total abdominal  hysterectomy was done.  Uterus was the size of someone who is [redacted] weeks  pregnant.  Ovaries were normal, and they were left in place.  Blood loss was  minimal.  Postoperatively, she developed a ileus.  It responded to IV  fluids, Dulcolax suppository.  By the third postoperative day she was  passing flatus and was ready for discharge.   DISCHARGE MEDICATIONS:  Vicodin p.r.n.   DISCHARGE ACTIVITIES:  Ambulatory.   </DISCHARGE DIET>  Regular.   DISCHARGE INSTRUCTIONS:  She will return to the office in four days for  staple removal.  Final pathology report is not available at the time of  dictation.   CONDITION ON DISCHARGE:  Improved.   DISCHARGE DIAGNOSIS:  Large fibroids.   OPERATIONS:  Total abdominal hysterectomy.     Dani   DLG/MEDQ  D:  02/07/2004  T:  02/07/2004  Job:  [id number]

## 2010-08-06 ENCOUNTER — Encounter: Payer: Self-pay | Admitting: Cardiovascular Disease

## 2010-09-01 ENCOUNTER — Other Ambulatory Visit: Payer: Self-pay | Admitting: Family Medicine

## 2010-10-30 ENCOUNTER — Encounter: Payer: Self-pay | Admitting: Family Medicine

## 2010-10-30 ENCOUNTER — Ambulatory Visit (INDEPENDENT_AMBULATORY_CARE_PROVIDER_SITE_OTHER): Payer: BC Managed Care – PPO | Admitting: Family Medicine

## 2010-10-30 VITALS — BP 102/80 | HR 73 | Temp 97.4°F | Wt 178.2 lb

## 2010-10-30 DIAGNOSIS — IMO0001 Reserved for inherently not codable concepts without codable children: Secondary | ICD-10-CM

## 2010-10-30 DIAGNOSIS — E785 Hyperlipidemia, unspecified: Secondary | ICD-10-CM

## 2010-10-30 DIAGNOSIS — R5383 Other fatigue: Secondary | ICD-10-CM

## 2010-10-30 DIAGNOSIS — M791 Myalgia, unspecified site: Secondary | ICD-10-CM | POA: Insufficient documentation

## 2010-10-30 DIAGNOSIS — Z862 Personal history of diseases of the blood and blood-forming organs and certain disorders involving the immune mechanism: Secondary | ICD-10-CM

## 2010-10-30 DIAGNOSIS — R5381 Other malaise: Secondary | ICD-10-CM

## 2010-10-30 LAB — BASIC METABOLIC PANEL
BUN: 22 mg/dL (ref 6–23)
CO2: 29 mEq/L (ref 19–32)
Calcium: 9.5 mg/dL (ref 8.4–10.5)
Creatinine, Ser: 1 mg/dL (ref 0.4–1.2)
Glucose, Bld: 88 mg/dL (ref 70–99)

## 2010-10-30 LAB — HEPATIC FUNCTION PANEL
AST: 16 U/L (ref 0–37)
Alkaline Phosphatase: 57 U/L (ref 39–117)
Total Bilirubin: 0.3 mg/dL (ref 0.3–1.2)

## 2010-10-30 LAB — LIPID PANEL: Total CHOL/HDL Ratio: 2

## 2010-10-30 MED ORDER — TRAMADOL HCL 50 MG PO TABS: 50.0000 mg | ORAL_TABLET | Freq: Four times a day (QID) | ORAL | Status: DC | PRN

## 2010-10-30 NOTE — Patient Instructions (Signed)
Good to see you. Please stop taking your cholesterol medication(simvastatin) for now. Take Tramadol as needed for pain. We will call you Friday or Monday with your results.

## 2010-10-30 NOTE — Progress Notes (Signed)
Subjective:    [name], female    DOB: 07/28/1960, 50 y.o.   MRN: [id number]  HPI Very pleasant 50 yo female with h/o HTN, HLD with remote h/o CP in 68s here for generalized myalgias for several months (maybe even a year) that is becoming progressively worse. Pain occurs all day but usually worse when she wakes up in the morning. Mainly in her upper thighs or arms, usually one or the other but can occur bilaterally.  Denies pain in her joints. She has been a little more fatigued lately.  No LE or UE weakness.  NO FH of rheum diseases.  On Simvastatin 20 mg daily .  Last LDL elevated to 158 in January 2012. Lab Results  Component Value Date   CHOL 255* 01/23/2010   CHOL 219* 03/28/2009   CHOL 202* 08/09/2008   Lab Results  Component Value Date   HDL 76.70 01/23/2010   HDL 87.70 03/28/2009   HDL 40.98 08/09/2008    Lab Results  Component Value Date   TRIG 55.0 01/23/2010   TRIG 36.0 03/28/2009   TRIG 51.0 08/09/2008   Lab Results  Component Value Date   CHOLHDL 3 01/23/2010   CHOLHDL 2 03/28/2009   CHOLHDL 3 08/09/2008    Lab Results  Component Value Date   ALT 22 01/23/2010   AST 19 01/23/2010   ALKPHOS 48 01/23/2010   BILITOT 0.7 01/23/2010      Patient Active Problem List  Diagnoses  . HYPERLIPIDEMIA  . HYPERTENSION  . MYOCARDIAL INFARCTION, HX OF  . PAIN IN JOINT, MULTIPLE SITES  . FATIGUE  . WEIGHT GAIN  . PALPITATIONS  . DYSPNEA  . ANEMIA, IRON DEFICIENCY, HX OF  . POSTMENOPAUSAL STATUS  . DIZZINESS  . Generalized muscle ache   Past Medical History  Diagnosis Date  . HTN (hypertension)    Past Surgical History  Procedure Date  . Partial hysterectomy 1/06    fibroids; ovaries left in   . Cesarean section     x2  . Bone spur - removal    History  Substance Use Topics  . Smoking status: Never Smoker   . Smokeless tobacco: Not on file  . Alcohol Use: Not on file   Family History  Problem Relation Age of Onset  . Depression      both  sides of family    No Known Allergies Current Outpatient Prescriptions on File Prior to Visit  Medication Sig Dispense Refill  . aspirin 81 MG EC tablet Take 81 mg by mouth daily.        . Calcium Carbonate-Vitamin D 600-400 MG-UNIT per tablet Take 2 tablets by mouth daily.        . ferrous sulfate 325 (65 FE) MG tablet Take 325 mg by mouth daily. sometimes       . metoprolol tartrate (LOPRESSOR) 25 MG tablet Take 25 mg by mouth every evening.        . simvastatin (ZOCOR) 10 MG tablet Take 10 mg by mouth daily.        Marland Kitchen triamterene-hydrochlorothiazide (DYAZIDE) 37.5-25 MG per capsule Take 1 capsule by mouth every morning.         The PMH, PSH, Social History, Family History, Medications, and allergies have been reviewed in [facility], and have been updated if relevant.  Review of Systems See HPI  No joint swelling, LE edema, CP or SOB.    Objective:   Physical Exam BP 102/80  Pulse  73  Temp(Src) 97.4 F (36.3 C) (Oral)  Wt 178 lb 4 oz (80.854 kg)  General:  Well-developed,well-nourished,in no acute distress; alert,appropriate and cooperative throughout examination Head:  normocephalic and atraumatic.   Eyes:  vision grossly intact, pupils equal, pupils round, and pupils reactive to light.   Ears:  R ear normal and L ear normal.   Nose:  no external deformity.   Mouth:  good dentition.   Neck:  No deformities, masses, or tenderness noted. Lungs:  Normal respiratory effort, chest expands symmetrically. Lungs are clear to auscultation, no crackles or wheezes. Heart:  Normal rate and regular rhythm. S1 and S2 normal without gallop, murmur, click, rub or other extra sounds. Msk:  No deformity or scoliosis noted of thoracic or lumbar spine.   Extremities:  No clubbing, cyanosis, edema, or deformity noted with normal full range of motion of all joints.  FROM of all four extremities, no trigger points Neurologic:  alert & oriented X3, she is walking with a limp today. Skin:  Intact without  suspicious lesions or rashes Psych:  Cognition and judgment appear intact. Alert and cooperative with normal attention span and concentration. No apparent delusions, illusions, hallucinations     Assessment & Plan:   1. Generalized muscle ache   Deteriorated with unclear etiology and diff dx is wide-including rheum, fibromyalgia, myositis. ?if this is a reaction to simvastatin/myositis as pain started about the same time as she started simvastatin.  She has had remote h/o CP (?MI never worked up) but no true diagnosis of CAD.  Will hold simvastatin and check blood work as listed. The patient indicates understanding of these issues and agrees with the plan. Tramadol as needed. Basic Metabolic Panel (BMET), Sed Rate (ESR), CK (Creatine Kinase)  2. HYPERLIPIDEMIA   See above.  Recheck lipid and hepatic panel today. Lipid Profile, Hepatic function panel  3. FATIGUE  New.  Likely related to #1. Will check labs today including VIt D. Vitamin D (25 hydroxy)

## 2010-10-31 ENCOUNTER — Other Ambulatory Visit: Payer: Self-pay | Admitting: Family Medicine

## 2010-10-31 DIAGNOSIS — M791 Myalgia, unspecified site: Secondary | ICD-10-CM

## 2010-10-31 DIAGNOSIS — E785 Hyperlipidemia, unspecified: Secondary | ICD-10-CM

## 2010-11-05 ENCOUNTER — Encounter: Payer: Self-pay | Admitting: *Deleted

## 2010-11-19 ENCOUNTER — Encounter: Payer: Self-pay | Admitting: Family Medicine

## 2010-11-19 ENCOUNTER — Ambulatory Visit (INDEPENDENT_AMBULATORY_CARE_PROVIDER_SITE_OTHER): Payer: BC Managed Care – PPO | Admitting: Family Medicine

## 2010-11-19 ENCOUNTER — Ambulatory Visit (INDEPENDENT_AMBULATORY_CARE_PROVIDER_SITE_OTHER)
Admission: RE | Admit: 2010-11-19 | Discharge: 2010-11-19 | Disposition: A | Discharge status: Home / Self Care | Payer: BC Managed Care – PPO | Source: Ambulatory Visit | Attending: Family Medicine | Admitting: Family Medicine

## 2010-11-19 VITALS — BP 120/78 | HR 76 | Temp 97.5°F | Ht 61.5 in | Wt 178.4 lb

## 2010-11-19 DIAGNOSIS — M25551 Pain in right hip: Secondary | ICD-10-CM

## 2010-11-19 DIAGNOSIS — M79604 Pain in right leg: Secondary | ICD-10-CM

## 2010-11-19 DIAGNOSIS — M25559 Pain in unspecified hip: Secondary | ICD-10-CM

## 2010-11-19 DIAGNOSIS — M2550 Pain in unspecified joint: Secondary | ICD-10-CM

## 2010-11-19 DIAGNOSIS — M79609 Pain in unspecified limb: Secondary | ICD-10-CM

## 2010-11-19 MED ORDER — DICLOFENAC SODIUM 75 MG PO TBEC: 75.0000 mg | DELAYED_RELEASE_TABLET | Freq: Two times a day (BID) | ORAL | Status: DC

## 2010-11-19 MED ORDER — HYDROCODONE-ACETAMINOPHEN 5-500 MG PO TABS: 1.0000 | ORAL_TABLET | Freq: Four times a day (QID) | ORAL | Status: AC | PRN

## 2010-11-19 NOTE — Progress Notes (Signed)
  Subjective:    [name], female    DOB: [birth year], 50 y.o.   MRN: [id number]  HPI  Erin Bass, a 50 y.o. female presents today in the office for the following:    Having various myalgias and arthralgias. She was seen a few weeks ago by my partner Dr. Dayton Martes, and she was noted to have a moderately elevated sed rate, but otherwise her lab work was unremarkable. Her primary complaint today is RIGHT sided anterior thigh pain. She relates no specific trauma. She denies groin pain. She denies lateral buttocks pain and lateral thigh pain. No significant back pain.  Right anterior leg has been bothering her for about a couple of weeks. Felt like she could have some difficulty walking.  Also taking some OTC.   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   Mother has Rheumatoid Arthritis.  Saw Dr. Kellie Simmering in 2008 with a RhF > 70 and a normal ESR.  Also c/o multiyear polyarthralgia.  The PMH, PSH, Social History, Family History, Medications, and allergies have been reviewed in [facility], and have been updated if relevant.   Review of Systems REVIEW OF SYSTEMS  GEN: No fevers, chills. Nontoxic. Primarily MSK c/o today. MSK: Detailed in the HPI GI: tolerating PO intake without difficulty Neuro: No numbness, parasthesias, or tingling associated. Otherwise the pertinent positives of the ROS are noted above.      Objective:   Physical Exam   Physical Exam  Blood pressure 120/78, pulse 76, temperature 97.5 F (36.4 C), temperature source Oral, height 5' 1.5" (1.562 m), weight 178 lb 6.4 oz (80.922 kg), SpO2 99.00%.  GEN: Well-developed,well-nourished,in no acute distress; alert,appropriate and cooperative throughout examination HEENT: Normocephalic and atraumatic without obvious abnormalities. Ears, externally no deformities PULM: Breathing comfortably in no respiratory distress EXT: No clubbing, cyanosis, or edema PSYCH: Normally interactive. Cooperative  during the interview. Pleasant. Friendly and conversant. Not anxious or depressed appearing. Normal, full affect.  HIP EXAM: SIDE: ROM: Abduction, Flexion, Internal and External range of motion: fluid, no mechanical blocks Pain with terminal IROM and EROM: only anteriorly GTB: NT SLR: NEG Knees: No effusion FABER: NT REVERSE FABER: NT, neg Piriformis: NT at direct palpation Str: flexion: 3+/5 abduction: 4/5 adduction: 3+/5 Strength testing tender        Assessment & Plan:   1. Polyarthralgia  DG Hip Complete Right, High sensitivity CRP, Cyclic citrul peptide antibody, IgG, ANA, Sedimentation rate, Rheumatoid factor, CBC with Differential, Uric acid, diclofenac (VOLTAREN) 75 MG EC tablet, HYDROcodone-acetaminophen (VICODIN) 5-500 MG per tablet  2. Hip pain, right  DG Hip Complete Right, High sensitivity CRP, Cyclic citrul peptide antibody, IgG, ANA, Sedimentation rate, Rheumatoid factor, CBC with Differential, Uric acid, diclofenac (VOLTAREN) 75 MG EC tablet, HYDROcodone-acetaminophen (VICODIN) 5-500 MG per tablet  3. Leg pain, right  DG Hip Complete Right, High sensitivity CRP, Cyclic citrul peptide antibody, IgG, ANA, Sedimentation rate, Rheumatoid factor, CBC with Differential, Uric acid, diclofenac (VOLTAREN) 75 MG EC tablet, HYDROcodone-acetaminophen (VICODIN) 5-500 MG per tablet    R hip, anterior, that I think is primarily muscular.  The question of rheum dz is significant in this patient.  CCP AB, > 300  Lab Results  Component Value Date   ANA NEG 11/19/2010   RF 183* 11/19/2010   CRP > 10  I am concerned that this patient has RA or other rheum dz and will advise Rheumatological consult.

## 2010-11-19 NOTE — Patient Instructions (Signed)
Let's check labs, films.  Ok to use heat on the front part of your leg, and try to move your hip and resist gravity  Leg lifts, 3 sets a day, as many as you can do

## 2010-11-20 LAB — HIGH SENSITIVITY CRP: CRP, High Sensitivity: 11.85 mg/L — ABNORMAL HIGH (ref 0.000–5.000)

## 2010-11-20 LAB — CYCLIC CITRUL PEPTIDE ANTIBODY, IGG: Cyclic Citrullin Peptide Ab: >300 U/mL — ABNORMAL HIGH (ref 0.0–5.0)

## 2010-11-20 LAB — CBC WITH DIFFERENTIAL/PLATELET
Basophils Absolute: 0.1 10*3/uL (ref 0.0–0.1)
Eosinophils Absolute: 0.3 10*3/uL (ref 0.0–0.7)
HCT: 36 % (ref 36.0–46.0)
Lymphs Abs: 1.8 10*3/uL (ref 0.7–4.0)
MCV: 75.6 fl — ABNORMAL LOW (ref 78.0–100.0)
Monocytes Absolute: 0.7 10*3/uL (ref 0.1–1.0)
Monocytes Relative: 8.9 % (ref 3.0–12.0)
Platelets: 194 10*3/uL (ref 150.0–400.0)
RDW: 14.1 % (ref 11.5–14.6)

## 2010-11-20 LAB — URIC ACID: Uric Acid, Serum: 5.9 mg/dL (ref 2.4–7.0)

## 2010-11-20 LAB — RHEUMATOID FACTOR: Rhuematoid fact SerPl-aCnc: 183 IU/mL — ABNORMAL HIGH (ref <=14)

## 2010-11-27 ENCOUNTER — Encounter: Payer: Self-pay | Admitting: Family Medicine

## 2010-11-27 ENCOUNTER — Telehealth: Payer: Self-pay | Admitting: Family Medicine

## 2010-11-27 ENCOUNTER — Telehealth: Payer: Self-pay | Admitting: *Deleted

## 2010-11-27 DIAGNOSIS — M1990 Unspecified osteoarthritis, unspecified site: Secondary | ICD-10-CM

## 2010-11-27 NOTE — Telephone Encounter (Signed)
Ltr to be sent to pt Certified has been sent to [facility] via courier for processing...cdavis 11-27-2010

## 2010-11-27 NOTE — Telephone Encounter (Signed)
Advised pt of lab results.  She is agreeable to seeing Dr. Kellie Simmering, prefers appt after 11/20.  She wants to finish diclofenac, which is helping, and will call back if she wants the prednisone called in.    Home phone number changed in system.

## 2010-12-01 NOTE — Telephone Encounter (Signed)
Will consult Dr. Kellie Simmering -- please send all recent labs done. High concern for RA

## 2010-12-01 NOTE — Telephone Encounter (Signed)
Addended by: Hannah Beat on: 12/01/2010 08:31 AM   Modules accepted: Orders

## 2011-01-22 ENCOUNTER — Other Ambulatory Visit: Payer: Self-pay | Admitting: Family Medicine

## 2011-02-19 ENCOUNTER — Ambulatory Visit (INDEPENDENT_AMBULATORY_CARE_PROVIDER_SITE_OTHER): Payer: BC Managed Care – PPO | Admitting: Family Medicine

## 2011-02-19 ENCOUNTER — Encounter: Payer: Self-pay | Admitting: Family Medicine

## 2011-02-19 VITALS — BP 110/78 | HR 68 | Temp 97.3°F | Wt 177.2 lb

## 2011-02-19 DIAGNOSIS — M79609 Pain in unspecified limb: Secondary | ICD-10-CM

## 2011-02-19 DIAGNOSIS — M79659 Pain in unspecified thigh: Secondary | ICD-10-CM

## 2011-02-19 MED ORDER — DEXAMETHASONE SODIUM PHOSPHATE 10 MG/ML IJ SOLN
10.0000 mg | Freq: Once | INTRAMUSCULAR | Status: AC
  Administered 2011-02-19: 10 mg via INTRAMUSCULAR

## 2011-02-19 NOTE — Patient Instructions (Signed)
Great to see you. Please call us on Monday with an update of your symptoms.

## 2011-02-19 NOTE — Progress Notes (Signed)
Addended by: Gilmer Mor on: 02/19/2011 09:37 AM   Modules accepted: Orders

## 2011-02-19 NOTE — Progress Notes (Signed)
Subjective:    [name], female    DOB: April 29, 1960, 51 y.o.   MRN: [id number]  HPI  Erin Bass, a 51 y.o. female presents for follow up thigh/hip pain.   Having various myalgias and arthralgias.   Multiple rheum labs abnormal, includring RF, CRP, SED rate. Referred back to Dr. Kellie Simmering.  Reviewed his noe from 01/22/2011- feels her pain is muscular- placed on prednisone and sent to PT.  Went to PT several times, which did help but she can no longer afford to go.  Pain has been ok, she has good days and bad days. Mainly right thigh- helps to "massage palpable knots in thigh)  Patient Active Problem List  Diagnoses  . HYPERLIPIDEMIA  . HYPERTENSION  . PAIN IN JOINT, MULTIPLE SITES  . FATIGUE  . WEIGHT GAIN  . PALPITATIONS  . DYSPNEA  . ANEMIA, IRON DEFICIENCY, HX OF  . POSTMENOPAUSAL STATUS  . DIZZINESS  . Generalized muscle ache   Past Medical History  Diagnosis Date  . HTN (hypertension)    Past Surgical History  Procedure Date  . Partial hysterectomy 1/06    fibroids; ovaries left in   . Cesarean section     x2  . Bone spur - removal    History  Substance Use Topics  . Smoking status: Never Smoker   . Smokeless tobacco: Not on file  . Alcohol Use: Not on file   Family History  Problem Relation Age of Onset  . Depression      both sides of family    No Known Allergies Current Outpatient Prescriptions on File Prior to Visit  Medication Sig Dispense Refill  . aspirin 81 MG EC tablet Take 81 mg by mouth daily.        . Calcium Carbonate-Vitamin D 600-400 MG-UNIT per tablet Take 2 tablets by mouth daily.        . ferrous sulfate 325 (65 FE) MG tablet Take 325 mg by mouth daily. sometimes       . metoprolol tartrate (LOPRESSOR) 25 MG tablet TAKE ONE TABLET BY MOUTH TWICE DAILY  60 tablet  6  . simvastatin (ZOCOR) 10 MG tablet Take 10 mg by mouth daily.        Marland Kitchen triamterene-hydrochlorothiazide (DYAZIDE) 37.5-25 MG per capsule Take 1 capsule  by mouth every morning.         The PMH, PSH, Social History, Family History, Medications, and allergies have been reviewed in [facility], and have been updated if relevant.   The PMH, PSH, Social History, Family History, Medications, and allergies have been reviewed in [facility], and have been updated if relevant.   Review of Systems REVIEW OF SYSTEMS  GEN: No fevers, chills. Nontoxic. Primarily MSK c/o today. MSK: Detailed in the HPI GI: tolerating PO intake without difficulty Neuro: No numbness, parasthesias, or tingling associated. Otherwise the pertinent positives of the ROS are noted above.      Objective:   Physical Exam  Blood pressure 110/78, pulse 68, temperature 97.3 F (36.3 C), temperature source Oral, weight 177 lb 4 oz (80.4 kg).  GEN: Well-developed,well-nourished,in no acute distress; alert,appropriate and cooperative throughout examination HEENT: Normocephalic and atraumatic without obvious abnormalities. Ears, externally no deformities PULM: Breathing comfortably in no respiratory distress EXT: No clubbing, cyanosis, or edema, FROM of hips, she does have some palpable muscle knots on right thigh. PSYCH: Normally interactive. Cooperative during the interview. Pleasant. Friendly and conversant. Not anxious or depressed appearing. Normal, full affect.  Assessment & Plan:   1. Thigh pain   Unchanged. Discussed side effects of prolonged steroid use. Advised massage therapy. Will give Decadron 10 mg IM today. The patient indicates understanding of these issues and agrees with the plan.

## 2011-02-24 ENCOUNTER — Telehealth: Payer: Self-pay | Admitting: Family Medicine

## 2011-02-24 NOTE — Telephone Encounter (Signed)
Left message on cell phone voicemail for patient to return call. 

## 2011-02-24 NOTE — Telephone Encounter (Signed)
Triage Record Num: [id number] Operator: Estevan Oaks Patient Name: Felicity Penix Call Date & Time: 02/23/2011 5:26:14PM Patient Phone: [phone] PCP: Ruthe Mannan Patient Gender: Female PCP Fax : [phone] Patient DOB: 1960-11-05 Practice Name: [facility] Reason for Call: Caller: Kehlani/Patient; PCP: Ruthe Mannan Nestor Ramp); CB#: [phone]; Calling to notify Dr Dayton Martes the results of the cortisone injection she got on 2/1. Sts she got no relief at all. Has ongoing leg pain of several months duration. Sts no change at all--the pain is the same as what she's been dealing with. Ms Gaspari does not have f/u appt. Sts Dr Elmer Sow plan was to see how the cortisone inj did first. Sts she can be reached tomorrow at this number and may leave a message: [phone] PLEASE SEE NOTE ABOVE AND DIRECT TO DR ARON'S ATTENTION. Protocol(s) Used: Office Note Recommended Outcome per Protocol: Information Noted and Sent to Office Reason for Outcome: Caller information to office Care Advice: ~ 02/23/2011 5:33:18PM Page 1 of 1 [email or web address]

## 2011-02-24 NOTE — Telephone Encounter (Signed)
Patient is coming in to see Dr. Dayton Martes tomorrow.

## 2011-02-24 NOTE — Telephone Encounter (Signed)
Please call pt to schedule follow up appt

## 2011-02-25 ENCOUNTER — Ambulatory Visit (INDEPENDENT_AMBULATORY_CARE_PROVIDER_SITE_OTHER): Payer: BC Managed Care – PPO | Admitting: Family Medicine

## 2011-02-25 ENCOUNTER — Encounter: Payer: Self-pay | Admitting: Family Medicine

## 2011-02-25 ENCOUNTER — Telehealth: Payer: Self-pay | Admitting: Family Medicine

## 2011-02-25 VITALS — BP 110/80 | HR 58 | Temp 98.0°F | Wt 180.5 lb

## 2011-02-25 DIAGNOSIS — M79609 Pain in unspecified limb: Secondary | ICD-10-CM

## 2011-02-25 DIAGNOSIS — M79659 Pain in unspecified thigh: Secondary | ICD-10-CM

## 2011-02-25 MED ORDER — PREDNISONE 20 MG PO TABS: ORAL_TABLET | ORAL | Status: DC

## 2011-02-25 NOTE — Telephone Encounter (Signed)
Patient coming to see Dr. Dayton Martes today.

## 2011-02-25 NOTE — Telephone Encounter (Signed)
Triage Record Num: [id number] Operator: Thayer Headings Patient Name: Erin Bass Call Date & Time: 02/24/2011 3:39:16PM Patient Phone: [phone] PCP: Ruthe Mannan Patient Gender: Female PCP Fax : [phone] Patient DOB: Jul 12, 1960 Practice Name: [facility] Reason for Call: Caller: [name]; PCP: Ruthe Mannan Nestor Ramp); CB#: [phone]; ; ; Calling today 02/24/11 regarding she has been having right leg pain. Saw Dr. Dayton Martes 02/19/11 and was given cortisone shot and MD wanted her to call back to let her know if it was effective. Calling back b/c still having leg pain. No redness or swelling, you can feel little lumps (cysts), MD felt these when she was in the office. Able to bear wt, but has problems going up stairs. Also when she first gets up, feels like it spasms or locks. Right side of neck feels stiff which started about 2 months ago. The leg pain has been going on since September. Now feels a little swollen behind the knee. Emergent symptoms r/o by Leg Non-Injury guidelines with exception of gradual onset (weeks to months) of episodes of soft, balloon-like swelling behind knee that may or may not be painful and has not been previously evaluated. Care advice given. Spoke with Lyla Son at office and appt scheduled for 9:15 tomorrow 02/25/11 with Dr. Dayton Martes. Protocol(s) Used: Leg Non-Injury Recommended Outcome per Protocol: See Provider within 72 Hours Reason for Outcome: Soft, balloon-like swelling behind knee that may or may not be painful AND has not been previously evaluated Care Advice: ~ SYMPTOM / CONDITION MANAGEMENT 02/24/2011 3:57:38PM Page 1 of 1 [email or web address]

## 2011-02-25 NOTE — Progress Notes (Signed)
Subjective:    [name], female    DOB: April 03, 1960, 51 y.o.   MRN: [id number]  HPI  Erin Bass, a 51 y.o. female presents for follow up thigh/hip pain.   Having various myalgias and arthralgias.   Multiple rheum labs abnormal, includring RF, CRP, SED rate. Referred back to Dr. Kellie Simmering.  Reviewed his noe from 01/22/2011- feels her pain is muscular- placed on prednisone and sent to PT.  Went to PT several times, which did help but she can no longer afford to go.  Pain has been ok, she has good days and bad days. Mainly right thigh- helps to "massage palpable knots in thigh), given Decadron 10 mg IM on 1/31. Pain has not improved, had more relief with oral prednisone.  Patient Active Problem List  Diagnoses  . HYPERLIPIDEMIA  . HYPERTENSION  . PAIN IN JOINT, MULTIPLE SITES  . FATIGUE  . WEIGHT GAIN  . PALPITATIONS  . DYSPNEA  . ANEMIA, IRON DEFICIENCY, HX OF  . POSTMENOPAUSAL STATUS  . DIZZINESS  . Generalized muscle ache  . Thigh pain   Past Medical History  Diagnosis Date  . HTN (hypertension)    Past Surgical History  Procedure Date  . Partial hysterectomy 1/06    fibroids; ovaries left in   . Cesarean section     x2  . Bone spur - removal    History  Substance Use Topics  . Smoking status: Never Smoker   . Smokeless tobacco: Not on file  . Alcohol Use: Not on file   Family History  Problem Relation Age of Onset  . Depression      both sides of family    No Known Allergies Current Outpatient Prescriptions on File Prior to Visit  Medication Sig Dispense Refill  . aspirin 81 MG EC tablet Take 81 mg by mouth daily.        . Calcium Carbonate-Vitamin D 600-400 MG-UNIT per tablet Take 2 tablets by mouth daily.        . ferrous sulfate 325 (65 FE) MG tablet Take 325 mg by mouth daily. sometimes       . metoprolol tartrate (LOPRESSOR) 25 MG tablet TAKE ONE TABLET BY MOUTH TWICE DAILY  60 tablet  6  . simvastatin (ZOCOR) 10 MG tablet Take  10 mg by mouth daily.        Marland Kitchen triamterene-hydrochlorothiazide (DYAZIDE) 37.5-25 MG per capsule Take 1 capsule by mouth every morning.         The PMH, PSH, Social History, Family History, Medications, and allergies have been reviewed in [facility], and have been updated if relevant.   The PMH, PSH, Social History, Family History, Medications, and allergies have been reviewed in [facility], and have been updated if relevant.   Review of Systems REVIEW OF SYSTEMS  GEN: No fevers, chills. Nontoxic. Primarily MSK c/o today. MSK: Detailed in the HPI GI: tolerating PO intake without difficulty Neuro: No numbness, parasthesias, or tingling associated. Otherwise the pertinent positives of the ROS are noted above.      Objective:   Physical Exam BP 110/80  Pulse 58  Temp(Src) 98 F (36.7 C) (Oral)  Wt 180 lb 8 oz (81.874 kg)  GEN: Well-developed,well-nourished,in no acute distress; alert,appropriate and cooperative throughout examination HEENT: Normocephalic and atraumatic without obvious abnormalities. Ears, externally no deformities PULM: Breathing comfortably in no respiratory distress EXT: No clubbing, cyanosis, or edema, FROM of hips, she does have some palpable muscle knots on right  thigh. PSYCH: Normally interactive. Cooperative during the interview. Pleasant. Friendly and conversant. Not anxious or depressed appearing. Normal, full affect.     Assessment & Plan:   1. Thigh pain   Unchanged. Discussed side effects of prolonged steroid use. She will try massage therapy. Will repeat prednisone 60 mg daily x 6 days. She will cal lme at end of month with an update. The patient indicates understanding of these issues and agrees with the plan.

## 2011-04-21 ENCOUNTER — Other Ambulatory Visit: Payer: Self-pay | Admitting: Family Medicine

## 2011-05-25 ENCOUNTER — Ambulatory Visit (INDEPENDENT_AMBULATORY_CARE_PROVIDER_SITE_OTHER): Payer: BC Managed Care – PPO | Admitting: Family Medicine

## 2011-05-25 ENCOUNTER — Encounter: Payer: Self-pay | Admitting: Family Medicine

## 2011-05-25 VITALS — BP 130/90 | HR 72 | Temp 98.1°F | Wt 177.0 lb

## 2011-05-25 DIAGNOSIS — M79659 Pain in unspecified thigh: Secondary | ICD-10-CM

## 2011-05-25 DIAGNOSIS — M79609 Pain in unspecified limb: Secondary | ICD-10-CM

## 2011-05-25 DIAGNOSIS — R5383 Other fatigue: Secondary | ICD-10-CM

## 2011-05-25 DIAGNOSIS — R5381 Other malaise: Secondary | ICD-10-CM

## 2011-05-25 LAB — T4, FREE: Free T4: 0.9 ng/dL (ref 0.60–1.60)

## 2011-05-25 LAB — CBC WITH DIFFERENTIAL/PLATELET
Basophils Relative: 0.5 % (ref 0.0–3.0)
Eosinophils Relative: 2.9 % (ref 0.0–5.0)
HCT: 35 % — ABNORMAL LOW (ref 36.0–46.0)
Lymphs Abs: 1.4 10*3/uL (ref 0.7–4.0)
MCV: 75.5 fl — ABNORMAL LOW (ref 78.0–100.0)
Monocytes Absolute: 0.7 10*3/uL (ref 0.1–1.0)
Monocytes Relative: 7.2 % (ref 3.0–12.0)
Neutrophils Relative %: 74.1 % (ref 43.0–77.0)
RBC: 4.63 Mil/uL (ref 3.87–5.11)
WBC: 9.3 10*3/uL (ref 4.5–10.5)

## 2011-05-25 NOTE — Patient Instructions (Signed)
Try the hip exercises to see if that helps. We will call you with your lab results.   Try massage.

## 2011-05-25 NOTE — Progress Notes (Signed)
Subjective:    [name], female    DOB: 26-Dec-1960, 51 y.o.   MRN: [id number]  HPI  Erin Bass, a 51 y.o. female presents for follow up thigh/hip pain.   Having various myalgias and arthralgias.   Multiple rheum labs abnormal, includring RF, CRP, SED rate. Referred back to Dr. Kellie Simmering.  Reviewed his noe from 01/22/2011- feels her pain is muscular- placed on prednisone and sent to PT.  Went to PT several times, which did help but she can no longer afford to go.  Steroids has helped her symptoms but they still return.  Now having some right hip pain and more fatigue. Still not having pain in other leg. Sometimes has some unilateral LE edema.  Patient Active Problem List  Diagnoses  . HYPERLIPIDEMIA  . HYPERTENSION  . PAIN IN JOINT, MULTIPLE SITES  . FATIGUE  . WEIGHT GAIN  . PALPITATIONS  . DYSPNEA  . ANEMIA, IRON DEFICIENCY, HX OF  . POSTMENOPAUSAL STATUS  . DIZZINESS  . Generalized muscle ache  . Thigh pain   Past Medical History  Diagnosis Date  . HTN (hypertension)    Past Surgical History  Procedure Date  . Partial hysterectomy 1/06    fibroids; ovaries left in   . Cesarean section     x2  . Bone spur - removal    History  Substance Use Topics  . Smoking status: Never Smoker   . Smokeless tobacco: Not on file  . Alcohol Use: Not on file   Family History  Problem Relation Age of Onset  . Depression      both sides of family    No Known Allergies Current Outpatient Prescriptions on File Prior to Visit  Medication Sig Dispense Refill  . aspirin 81 MG EC tablet Take 81 mg by mouth daily.        . Calcium Carbonate-Vitamin D 600-400 MG-UNIT per tablet Take 2 tablets by mouth daily.        . ferrous sulfate 325 (65 FE) MG tablet Take 325 mg by mouth daily. sometimes       . metoprolol tartrate (LOPRESSOR) 25 MG tablet TAKE ONE TABLET BY MOUTH TWICE DAILY  60 tablet  6  . predniSONE (DELTASONE) 20 MG tablet 3 tablets by mouth every  morning x 6 days  20 tablet  0  . simvastatin (ZOCOR) 10 MG tablet Take 10 mg by mouth daily.        Marland Kitchen triamterene-hydrochlorothiazide (DYAZIDE) 37.5-25 MG per capsule Take 1 capsule by mouth every morning.        . triamterene-hydrochlorothiazide (MAXZIDE-25) 37.5-25 MG per tablet TAKE TWO TABLETS BY MOUTH EVERY DAY IN THE MORNING FOR BLOOD PRESSURE  60 tablet  5   The PMH, PSH, Social History, Family History, Medications, and allergies have been reviewed in [facility], and have been updated if relevant.   The PMH, PSH, Social History, Family History, Medications, and allergies have been reviewed in [facility], and have been updated if relevant.   Review of Systems REVIEW OF SYSTEMS  GEN: No fevers, chills. Nontoxic. Primarily MSK c/o today. MSK: Detailed in the HPI GI: tolerating PO intake without difficulty Neuro: No numbness, parasthesias, or tingling associated. Otherwise the pertinent positives of the ROS are noted above.      Objective:   Physical Exam BP 130/90  Pulse 72  Temp(Src) 98.1 F (36.7 C) (Oral)  Wt 177 lb (80.287 kg)  GEN: Well-developed,well-nourished,in no acute distress; alert,appropriate and  cooperative throughout examination HEENT: Normocephalic and atraumatic without obvious abnormalities. Ears, externally no deformities PULM: Breathing comfortably in no respiratory distress EXT: No clubbing, cyanosis, or edema, FROM of hips, she does have some palpable muscle knots on right thigh mildly TTP over trochanteric bursa. PSYCH: Normally interactive. Cooperative during the interview. Pleasant. Friendly and conversant. Not anxious or depressed appearing. Normal, full affect.     Assessment & Plan:   1. Fatigue  Most likely multifactorial, but will check other labs to rule out reversible causes. TSH, T4, Free, CBC with Differential  2. Thigh pain  Still having flares- ? Need a second rheum referral. Pt would like to hold off on that. Given exercises for trochanteric  bursitis as this may also be playing a roll. The patient indicates understanding of these issues and agrees with the plan.

## 2011-05-28 ENCOUNTER — Other Ambulatory Visit: Payer: Self-pay | Admitting: Family Medicine

## 2011-05-28 DIAGNOSIS — D509 Iron deficiency anemia, unspecified: Secondary | ICD-10-CM | POA: Insufficient documentation

## 2011-06-16 ENCOUNTER — Telehealth: Payer: Self-pay | Admitting: Family Medicine

## 2011-06-16 NOTE — Telephone Encounter (Signed)
Spoke to the patient, and she wants to cancel the Hematology referral right now. She has to pay 30% of the specialists bill and she cant afford it. She has started taking the iron now 2 times a day and is feeling a little better. She will call when she wants the referral to Hematology. I will cancel the referral.

## 2011-07-02 ENCOUNTER — Telehealth: Payer: Self-pay | Admitting: *Deleted

## 2011-07-02 NOTE — Telephone Encounter (Signed)
Pt called earler, stated she has had diarrhea, vomiting.  She's better now but is asking what should she drink to try and better hydrate herself.  Per Dr. Dayton Martes, advised pt to drink plenty of water and give it some time.  Advised she can have a small amount of gatorade but not to over do that, since it's high in sodium.  Pt stated she understood, will continue with water and time.

## 2011-08-13 ENCOUNTER — Telehealth: Payer: Self-pay | Admitting: Family Medicine

## 2011-08-13 NOTE — Telephone Encounter (Signed)
Will see tomorrow

## 2011-08-13 NOTE — Telephone Encounter (Signed)
Caller: [name]/Patient; PCP: Ruthe Mannan (Nestor Ramp); CB#: [phone]; ; ; Call regarding Foot Pain;  Onset- on/off x 1 year per pt. She c/o of swelling of the left ankle.  Emergent s/s of Foot non-injury protocol r/o. Pt to see provider within 24 hrs. Appt scheduled for tomorrow 08/14/11 at 10:45am with Dr. Para March, ok to schedule per office-Lashawn.

## 2011-08-14 ENCOUNTER — Encounter: Payer: Self-pay | Admitting: Family Medicine

## 2011-08-14 ENCOUNTER — Ambulatory Visit (INDEPENDENT_AMBULATORY_CARE_PROVIDER_SITE_OTHER): Payer: BC Managed Care – PPO | Admitting: Family Medicine

## 2011-08-14 VITALS — BP 138/110 | HR 66 | Temp 98.2°F | Wt 171.8 lb

## 2011-08-14 DIAGNOSIS — M2550 Pain in unspecified joint: Secondary | ICD-10-CM

## 2011-08-14 DIAGNOSIS — I10 Essential (primary) hypertension: Secondary | ICD-10-CM

## 2011-08-14 MED ORDER — LISINOPRIL 10 MG PO TABS: 10.0000 mg | ORAL_TABLET | Freq: Every day | ORAL | Status: DC

## 2011-08-14 MED ORDER — TRAMADOL HCL 50 MG PO TABS: 50.0000 mg | ORAL_TABLET | Freq: Three times a day (TID) | ORAL | Status: DC | PRN

## 2011-08-14 NOTE — Assessment & Plan Note (Signed)
Stop other meds in case this could be related to HCTZ.  Will start ACE and have her f/u with PCP in 1 week for BP check and likely BMET.  D/w pt, she agrees.

## 2011-08-14 NOTE — Patient Instructions (Addendum)
Bring a list of your meds to every visit.  Stop your previous BP meds (metoprolol and triamterene/hydrochlorothiazide) and start taking lisinopril 10mg  a day. Come back for a recheck with Dr. Dayton Martes in about 1 week.  You'll likely need labs at that time.  Take tramadol for pain.  It can make you drowsy.  Take care.

## 2011-08-14 NOTE — Assessment & Plan Note (Signed)
If not improved off her old BP meds (maxzide, metoprolol), then me cause would be much less likely.  No instability on ankle exam.  Will defer to PCP.

## 2011-08-14 NOTE — Progress Notes (Signed)
R upper leg and knee pain, L ankle pain recently. She had h/o complaints of arm/wrist, 1st MTP, ankle and hip pain.  Off metoprolol for about 1 week and taking 1 maxzide a day for about 1 week.  Since she cut back, the aches in her leg are some better.  She was prev seen by Dr. Kellie Simmering with rheum.  Pt was worried about gout.  She prev improved on prednisone.  S/p hysterectomy.  Asking about options.  Prev uric acid note elevated.   She is now on a low purine diet.    Meds, vitals, and allergies reviewed.   ROS: See HPI.  Otherwise, noncontributory.  nad ncat Mmm rrr ctab abd soft, not ttp Ext w/o edema L ankle minimally ttp near the medial malleolus but mortise stable, no midfoot pain and no erythema or bruising.

## 2011-08-26 ENCOUNTER — Encounter: Payer: Self-pay | Admitting: Family Medicine

## 2011-08-26 ENCOUNTER — Other Ambulatory Visit: Payer: Self-pay | Admitting: Family Medicine

## 2011-08-26 ENCOUNTER — Ambulatory Visit (INDEPENDENT_AMBULATORY_CARE_PROVIDER_SITE_OTHER): Payer: BC Managed Care – PPO | Admitting: Family Medicine

## 2011-08-26 VITALS — BP 138/82 | HR 80 | Temp 98.3°F | Wt 169.0 lb

## 2011-08-26 DIAGNOSIS — I10 Essential (primary) hypertension: Secondary | ICD-10-CM

## 2011-08-26 DIAGNOSIS — IMO0001 Reserved for inherently not codable concepts without codable children: Secondary | ICD-10-CM | POA: Insufficient documentation

## 2011-08-26 DIAGNOSIS — D509 Iron deficiency anemia, unspecified: Secondary | ICD-10-CM

## 2011-08-26 DIAGNOSIS — M2550 Pain in unspecified joint: Secondary | ICD-10-CM

## 2011-08-26 LAB — CBC WITH DIFFERENTIAL/PLATELET
Basophils Absolute: 0 10*3/uL (ref 0.0–0.1)
Basophils Relative: 0.4 % (ref 0.0–3.0)
Eosinophils Absolute: 0.3 10*3/uL (ref 0.0–0.7)
HCT: 34 % — ABNORMAL LOW (ref 36.0–46.0)
Hemoglobin: 10.8 g/dL — ABNORMAL LOW (ref 12.0–15.0)
Lymphs Abs: 1.4 10*3/uL (ref 0.7–4.0)
MCHC: 31.7 g/dL (ref 30.0–36.0)
MCV: 74.3 fl — ABNORMAL LOW (ref 78.0–100.0)
Monocytes Absolute: 0.5 10*3/uL (ref 0.1–1.0)
Neutro Abs: 6.2 10*3/uL (ref 1.4–7.7)
RBC: 4.57 Mil/uL (ref 3.87–5.11)
RDW: 15.9 % — ABNORMAL HIGH (ref 11.5–14.6)

## 2011-08-26 NOTE — Patient Instructions (Addendum)
Please stop by to see Asher Muir on your way out to set up your referral.

## 2011-08-26 NOTE — Progress Notes (Addendum)
Subjective:    [name], female    DOB: 11-Feb-1960, 51 y.o.   MRN: [id number]  HPI  Erin Bass, a 51 y.o. female presents for follow up.  Saw Dr. Para March last week- she was concerned that her BP medications were worsening her myalgias and arthralgias (still with unclear diagnosis after rheum evaluation).  Stopped taking her Maxzide and metoprolol and was started on Lisinopril 10 mg daily.  Feels less stiff but still having various myalgias and arthralgias.   Multiple rheum labs abnormal, includring RF, CRP, SED rate. Referred back to Dr. Kellie Simmering.  Reviewed his noe from 01/22/2011- feels her pain is muscular- placed on prednisone and sent to PT.  Went to PT several times, which did help but she can no longer afford to go.  Steroids has helped her symptoms but they still return.  Now having some right hip pain and more fatigue. Still not having pain in other leg. Sometimes has some unilateral LE edema.  She feels something is going on and would like a diagnosis. Notices that when she doesn't eat, symptoms are better.  Currently has pain in feet, ankles and knees.  Patient Active Problem List  Diagnosis  . HYPERLIPIDEMIA  . HYPERTENSION  . PAIN IN JOINT, MULTIPLE SITES  . FATIGUE  . WEIGHT GAIN  . PALPITATIONS  . DYSPNEA  . ANEMIA, IRON DEFICIENCY, HX OF  . POSTMENOPAUSAL STATUS  . DIZZINESS  . Generalized muscle ache  . Thigh pain  . Fatigue  . Microcytic anemia   Past Medical History  Diagnosis Date  . HTN (hypertension)    Past Surgical History  Procedure Date  . Partial hysterectomy 1/06    fibroids; ovaries left in   . Cesarean section     x2  . Bone spur - removal    History  Substance Use Topics  . Smoking status: Never Smoker   . Smokeless tobacco: Not on file  . Alcohol Use: Not on file   Family History  Problem Relation Age of Onset  . Depression      both sides of family    No Known Allergies Current Outpatient  Prescriptions on File Prior to Visit  Medication Sig Dispense Refill  . aspirin 81 MG EC tablet Take 81 mg by mouth daily.        . ferrous sulfate 325 (65 FE) MG tablet Take 325 mg by mouth daily. sometimes       . lisinopril (PRINIVIL,ZESTRIL) 10 MG tablet Take 1 tablet (10 mg total) by mouth daily.  90 tablet  3  . Multiple Vitamin (MULTIVITAMIN) tablet Take 1 tablet by mouth daily.       The PMH, PSH, Social History, Family History, Medications, and allergies have been reviewed in [facility], and have been updated if relevant.   The PMH, PSH, Social History, Family History, Medications, and allergies have been reviewed in [facility], and have been updated if relevant.   Review of Systems REVIEW OF SYSTEMS  GEN: No fevers, chills. No HA, blurred vision MSK: Detailed in the HPI GI: tolerating PO intake without difficulty Neuro: No numbness, parasthesias, or tingling associated. Otherwise the pertinent positives of the ROS are noted above.      Objective:   Physical Exam BP 138/82  Pulse 80  Temp 98.3 F (36.8 C)  Wt 169 lb (76.658 kg)  GEN: Well-developed,well-nourished,in no acute distress; alert,appropriate and cooperative throughout examination HEENT: Normocephalic and atraumatic without obvious abnormalities. Ears, externally no  deformities PULM: Breathing comfortably in no respiratory distress EXT: No clubbing, cyanosis, or edema PSYCH: Normally interactive. Cooperative during the interview. Pleasant. Friendly and conversant. Not anxious or depressed appearing. Normal, full affect.     Assessment & Plan:   1. Myalgia and myositis  Unchanged with unclear etiology. ?RA. Will recheck labs, refer to another rheumatologist for a second opinion. CBC with Differential, Sedimentation Rate, Rheumatoid factor  2. Joint pain  Uric Acid, Ambulatory referral to Rheumatology  3. HYPERTENSION  Stable on lisinopril only. Continue current dose.

## 2011-08-31 ENCOUNTER — Other Ambulatory Visit: Payer: Self-pay

## 2011-08-31 MED ORDER — TRAMADOL HCL 50 MG PO TABS: 50.0000 mg | ORAL_TABLET | Freq: Three times a day (TID) | ORAL | Status: DC | PRN

## 2011-08-31 NOTE — Telephone Encounter (Signed)
Pt request refill on Tramadol for rt leg pain [facility]; pt said Dr Fernand Parkins her to take 2 tabs q8h prn.pt has appt with ortho 09/30/11.Please advise.

## 2011-09-15 ENCOUNTER — Telehealth: Payer: Self-pay | Admitting: Hematology and Oncology

## 2011-09-15 NOTE — Telephone Encounter (Signed)
S/W pt in re NP appt 9/10 @ 10 w/ Dr. Dalene Carrow. Referring Dr. Dayton Martes  Dx- Microcytic Anemia NP packet mailed out.

## 2011-09-16 ENCOUNTER — Telehealth: Payer: Self-pay | Admitting: Hematology and Oncology

## 2011-09-16 NOTE — Telephone Encounter (Signed)
C/D on 8/28 for appt 9/10

## 2011-09-29 ENCOUNTER — Ambulatory Visit (HOSPITAL_BASED_OUTPATIENT_CLINIC_OR_DEPARTMENT_OTHER): Payer: BC Managed Care – PPO | Admitting: Lab

## 2011-09-29 ENCOUNTER — Ambulatory Visit: Payer: BC Managed Care – PPO

## 2011-09-29 ENCOUNTER — Other Ambulatory Visit (HOSPITAL_BASED_OUTPATIENT_CLINIC_OR_DEPARTMENT_OTHER): Payer: BC Managed Care – PPO | Admitting: Hematology and Oncology

## 2011-09-29 ENCOUNTER — Ambulatory Visit (HOSPITAL_BASED_OUTPATIENT_CLINIC_OR_DEPARTMENT_OTHER): Payer: BC Managed Care – PPO | Admitting: Hematology and Oncology

## 2011-09-29 ENCOUNTER — Encounter: Payer: Self-pay | Admitting: Hematology and Oncology

## 2011-09-29 ENCOUNTER — Telehealth: Payer: Self-pay | Admitting: Hematology and Oncology

## 2011-09-29 VITALS — BP 150/94 | HR 85 | Temp 98.2°F | Resp 20 | Ht 61.5 in | Wt 166.1 lb

## 2011-09-29 DIAGNOSIS — I2510 Atherosclerotic heart disease of native coronary artery without angina pectoris: Secondary | ICD-10-CM

## 2011-09-29 DIAGNOSIS — D539 Nutritional anemia, unspecified: Secondary | ICD-10-CM

## 2011-09-29 DIAGNOSIS — M129 Arthropathy, unspecified: Secondary | ICD-10-CM

## 2011-09-29 DIAGNOSIS — D649 Anemia, unspecified: Secondary | ICD-10-CM

## 2011-09-29 DIAGNOSIS — I10 Essential (primary) hypertension: Secondary | ICD-10-CM

## 2011-09-29 LAB — CBC WITH DIFFERENTIAL/PLATELET
BASO%: 0.6 % (ref 0.0–2.0)
EOS%: 3.7 % (ref 0.0–7.0)
HCT: 35.4 % (ref 34.8–46.6)
LYMPH%: 17.7 % (ref 14.0–49.7)
MCH: 22.8 pg — ABNORMAL LOW (ref 25.1–34.0)
MCHC: 31.1 g/dL — ABNORMAL LOW (ref 31.5–36.0)
NEUT%: 71.1 % (ref 38.4–76.8)
RBC: 4.84 10*6/uL (ref 3.70–5.45)
lymph#: 1.7 10*3/uL (ref 0.9–3.3)

## 2011-09-29 LAB — COMPREHENSIVE METABOLIC PANEL (CC13)
AST: 17 U/L (ref 5–34)
Albumin: 3.9 g/dL (ref 3.5–5.0)
Alkaline Phosphatase: 78 U/L (ref 40–150)
BUN: 17 mg/dL (ref 7.0–26.0)
Glucose: 85 mg/dl (ref 70–99)
Potassium: 3.8 mEq/L (ref 3.5–5.1)
Sodium: 140 mEq/L (ref 136–145)
Total Bilirubin: 0.2 mg/dL (ref 0.20–1.20)
Total Protein: 7.7 g/dL (ref 6.4–8.3)

## 2011-09-29 LAB — URINALYSIS, MICROSCOPIC - CHCC
Blood: NEGATIVE
Nitrite: NEGATIVE
Protein: NEGATIVE mg/dL
pH: 6 (ref 4.6–8.0)

## 2011-09-29 LAB — MORPHOLOGY: PLT EST: ADEQUATE

## 2011-09-29 NOTE — Telephone Encounter (Signed)
Gave pt appt for 9/13 MD and IV Iron

## 2011-09-29 NOTE — Progress Notes (Signed)
CC:   Aundra Dubin, MD Ruthe Mannan, M.D.  IDENTIFYING STATEMENT:  The patient is a 51 year old woman seen at request of Dr. Dayton Martes with anemia.  HISTORY OF PRESENT ILLNESS:  The patient describes anemia for as long as she can remember.  She states that her mother is also anemic.  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  She received a hysterectomy 7 years ago and tells me that there were no complications.  She was placed on oral iron and did significantly well. She denies history of rectal bleeding but has never received a colonoscopy.  She has noted no change in bowel function.  She denies a history or is unaware of a history of sickle cell disease.  She tells me that she has an upcoming visit with Dr. Kellie Simmering and she has severe rheumatological problems.  Review of CBCs obtained at her PCP's office notes the following: 08/26/2011 white cell count 8.5, hemoglobin 10.8, hematocrit 34, platelets 251; 11/19/2010 white cell count 8.1, hemoglobin 11.4, hematocrit 36, platelets 194; 07/09/2009 white cell count 8.9, hemoglobin 11.6, hematocrit 36.7, platelets 269.  PAST MEDICAL HISTORY: 1. Hypertension. 2. Recent diagnosis of generalized arthritis/muscular joint pain. 3. History of CAD. 4. History of hypercholesterolemia. 5. Status post partial hysterectomy, status post C-section x2.  ALLERGIES:  None.  MEDICATIONS: 1. Aspirin 81 mg daily. 2. Ferrous sulfate 325 daily. 3. Fish oil 1000 mg daily. 4. Lisinopril 10 mg daily. 5. Multivitamin daily.  SOCIAL HISTORY:  The patient is a Scientist, product/process development.  Denies alcohol or tobacco use.  She is married with 2 children.  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  FAMILY HISTORY:  Mother with anemia.  Negative for cancer or other hematological disorders.  HEALTH MAINTENANCE:  Receives annual mammograms.  Has not received a colonoscopy.  REVIEW OF SYSTEMS:  Denies fever, chills, night sweats,  anorexia, weight loss.  GI:  Denies nausea, vomiting, abdominal pain, diarrhea, melena, hematochezia.  Cardiovascular:  Denies chest pain, PND, orthopnea, ankle swelling.  Respirations:  Denies cough, hemoptysis, wheeze, shortness of breath.  GI:  Denies nausea, vomiting, abdominal pain, diarrhea, melena, hematochezia.  GU:  Denies dysuria, hematuria, nocturia, frequency. Skin:  No bruising or bleeding.  Neurologic:  Denies headache, vision change, extremity weakness.  Rest of review of systems negative.  PHYSICAL EXAM:  General:  The patient is alert and awake x3.  Vitals: Pulse 85, blood pressure 150/94, temperature 98.2, respirations 20, weight 166.1 pounds.  HEENT:  Head is atraumatic, normocephalic. Sclerae anicteric.  Pupils equal, round, reactive to light.  Mouth moist without ulcerations, thrush or lesions.  Neck:  Supple.  Chest:  Clear to percussion and auscultation.  CVS:  First and second heart sounds present.  No added sounds or murmurs.  Abdomen:  Soft, nontender.  No palpable masses.  No hepatomegaly.  Bowel sounds present.  Extremities: No edema or calf tenderness.  Pulses present symmetrical.  Lymph nodes: No palpable cervical, axillary, inguinal lymphadenopathy.  CNS: Nonfocal.  IMPRESSION AND PLAN:  Mrs. Reynosa is a pleasant 51 year old woman with a longstanding history for anemia.  She is a Scientist, product/process development.  She denies overt blood loss with no history for bruising or bleeding.  Her diet is somewhat well-balanced.  She has underlying connective tissue disease and will be undergoing evaluation soon.  From a hematology standpoint will have her return to lab where we will have her obtain a smear with review morphology.  We will obtain iron  studies, vitamin B12. Rule out hemolysis with [place] and haptoglobin.  Will also obtain a serum protein electrophoresis with ISE to screen for plasma cell dyscrasia.  I will obtain urinalysis.  The patient returns to discuss results.   I spent more than half the time coordinating care.    ______________________________ Laurice Record, M.D. LIO/MEDQ  D:  09/29/2011  T:  09/29/2011  Job:  [id number]

## 2011-09-29 NOTE — Patient Instructions (Signed)
Erin Bass  [id number]   [facility] - AFTER VISIT SUMMARY   **RECOMMENDATIONS MADE BY THE CONSULTANT AND ANY TEST    RESULTS WILL BE SENT TO YOUR REFERRING DOCTORS.   YOUR EXAM FINDINGS, LABS AND RESULTS WERE DISCUSSED BY YOUR MD TODAY.    Your Updated drug allergies are: Allergies as of 09/29/2011  . (No Known Allergies)    Your current list of medications are: Current Outpatient Prescriptions  Medication Sig Dispense Refill  . aspirin 81 MG EC tablet Take 81 mg by mouth daily.        . ferrous sulfate 325 (65 FE) MG tablet Take 325 mg by mouth daily. sometimes       . fish oil-omega-3 fatty acids 1000 MG capsule Take 1 g by mouth as needed. Takes for joint pain      . lisinopril (PRINIVIL,ZESTRIL) 10 MG tablet Take 1 tablet (10 mg total) by mouth daily.  90 tablet  3  . Multiple Vitamin (MULTIVITAMIN) tablet Take 1 tablet by mouth daily.         INSTRUCTIONS GIVEN AND DISCUSSED:  See attached schedule   SPECIAL INSTRUCTIONS/FOLLOW-UP:  See above.  I acknowledge that I have been informed and understand all the instructions given to me and received a copy. I do not have any more questions at this time, but understand that I may call the [facility] at [phone] during business hours should I have any further questions or need assistance in obtaining follow-up care.

## 2011-09-29 NOTE — Progress Notes (Signed)
Primary care MD- Dr. Dayton Martes  Primary contact #- home- [phone]  [facility] to release information to: Joliyah Wojno- husband

## 2011-09-29 NOTE — Progress Notes (Signed)
This office note has been dictated.

## 2011-10-01 LAB — DIRECT ANTIGLOBULIN TEST (NOT AT ARMC)
DAT (Complement): NEGATIVE
DAT IgG: NEGATIVE

## 2011-10-01 LAB — SPEP & IFE WITH QIG
Alpha-1-Globulin: 4.5 % (ref 2.9–4.9)
Beta 2: 6.1 % (ref 3.2–6.5)
Gamma Globulin: 18.3 % (ref 11.1–18.8)
IgA: 372 mg/dL (ref 69–380)
IgG (Immunoglobin G), Serum: 1820 mg/dL — ABNORMAL HIGH (ref 690–1700)
IgM, Serum: 99 mg/dL (ref 52–322)

## 2011-10-01 LAB — HEMOGLOBINOPATHY EVALUATION
Hgb A2 Quant: 2.8 % (ref 2.2–3.2)
Hgb A: 97 % (ref 96.8–97.8)
Hgb F Quant: 0.2 % (ref 0.0–2.0)
Hgb S Quant: 0 %

## 2011-10-01 LAB — IRON AND TIBC
%SAT: 24 % (ref 20–55)
Iron: 59 ug/dL (ref 42–145)
TIBC: 241 ug/dL — ABNORMAL LOW (ref 250–470)

## 2011-10-01 LAB — FOLATE: Folate: >20 ng/mL

## 2011-10-01 LAB — FERRITIN: Ferritin: 277 ng/mL (ref 10–291)

## 2011-10-02 ENCOUNTER — Ambulatory Visit: Payer: BC Managed Care – PPO

## 2011-10-02 ENCOUNTER — Encounter: Payer: Self-pay | Admitting: Hematology and Oncology

## 2011-10-02 ENCOUNTER — Ambulatory Visit (HOSPITAL_BASED_OUTPATIENT_CLINIC_OR_DEPARTMENT_OTHER): Payer: BC Managed Care – PPO | Admitting: Hematology and Oncology

## 2011-10-02 VITALS — BP 165/98 | HR 81 | Temp 97.6°F | Resp 20 | Ht 61.5 in | Wt 163.7 lb

## 2011-10-02 DIAGNOSIS — M129 Arthropathy, unspecified: Secondary | ICD-10-CM

## 2011-10-02 DIAGNOSIS — D638 Anemia in other chronic diseases classified elsewhere: Secondary | ICD-10-CM

## 2011-10-02 NOTE — Progress Notes (Signed)
This office note has been dictated.

## 2011-10-02 NOTE — Patient Instructions (Signed)
Erin Bass  [id number]   [facility] - AFTER VISIT SUMMARY   **RECOMMENDATIONS MADE BY THE CONSULTANT AND ANY TEST    RESULTS WILL BE SENT TO YOUR REFERRING DOCTORS.   YOUR EXAM FINDINGS, LABS AND RESULTS WERE DISCUSSED BY YOUR MD TODAY.    Your Updated drug allergies are: Allergies as of 10/02/2011  . (No Known Allergies)    Your current list of medications are: Current Outpatient Prescriptions  Medication Sig Dispense Refill  . aspirin 81 MG EC tablet Take 81 mg by mouth daily.        . ferrous sulfate 325 (65 FE) MG tablet Take 325 mg by mouth daily. sometimes       . fish oil-omega-3 fatty acids 1000 MG capsule Take 1 g by mouth as needed. Takes for joint pain      . lisinopril (PRINIVIL,ZESTRIL) 10 MG tablet Take 1 tablet (10 mg total) by mouth daily.  90 tablet  3  . Multiple Vitamin (MULTIVITAMIN) tablet Take 1 tablet by mouth daily.      . predniSONE (DELTASONE) 5 MG tablet as directed. Taper         INSTRUCTIONS GIVEN AND DISCUSSED:  See attached schedule   SPECIAL INSTRUCTIONS/FOLLOW-UP:  See above.  I acknowledge that I have been informed and understand all the instructions given to me and received a copy. I do not have any more questions at this time, but understand that I may call the [facility] at [phone] during business hours should I have any further questions or need assistance in obtaining follow-up care.

## 2011-10-04 ENCOUNTER — Other Ambulatory Visit: Payer: Self-pay | Admitting: Family Medicine

## 2011-10-05 NOTE — Progress Notes (Signed)
CC:   Pollyann Savoy, M.D. Ruthe Mannan, M.D.  IDENTIFYING STATEMENT:  The patient is a 51 year old woman who presents to discuss results.  SUMMARY:  The patient was referred for anemia.  Besides profound arthritic type symptoms she had no other complaints.  She also informed me that she had consult with Dr. Corliss Skains recently and was told that she would be treated with methotrexate.  Results note the following:  On 09/29/2011 white cell count 9.7, hemoglobin 11, hematocrit 35.4, platelets 297; B12 1126; folate greater than 20; iron 59.  TIBC 241, saturation 24%, ferritin 277; there was no evidence of sickle cell; urinalysis showed no blood; there was no evidence of hemolysis as haptoglobin and [place] were unremarkable; serum protein electrophoresis did not demonstrate a monoclonal protein.  MEDICATIONS:  Reviewed and updated.  ALLERGIES:  None.  PAST MEDICAL HISTORY/FAMILY HISTORY/SOCIAL HISTORY:  Unchanged.  REVIEW OF SYSTEMS:  A 10 point review of systems negative except for muscular discomfort.  PHYSICAL EXAM:  General:  The patient is a well-appearing, well- nourished woman in no distress.  Vitals:  Pulse 81, blood pressure 165/98, temperature 97.6, respirations 20, weight 163 pounds.  HEENT: Head is atraumatic, normocephalic.  Sclerae anicteric.  Mouth moist. Chest/CVS:  Unremarkable.  Abdomen:  Soft, nontender.  Bowel sounds present.  Extremities:  No edema.  LABORATORY DATA:  As above.  In addition, sodium 140, potassium 3.8, chloride 103, CO2 25, BUN 17, creatinine 1, glucose 85, T bilirubin 0.2, alkaline phosphatase 78, AST 17, ALT 18, calcium 9.9.  IMPRESSION AND PLAN:  Ms. Salah is a pleasant 51 year old woman that has anemia of chronic disease with underlying arthritis.  She does not require oral or IV iron at this time.  She was reassured and follows up p.r.n. or in a year's time.    ______________________________ Laurice Record, M.D. LIO/MEDQ  D:   10/02/2011  T:  10/02/2011  Job:  [id number]

## 2011-10-06 ENCOUNTER — Telehealth: Payer: Self-pay | Admitting: Hematology and Oncology

## 2011-10-06 NOTE — Telephone Encounter (Signed)
Mailed appt schedule 6.10.14 to pt.

## 2011-10-06 NOTE — Telephone Encounter (Signed)
L.V.M. For pt [phone] advising on upcomming appt on 6/10

## 2011-10-12 ENCOUNTER — Ambulatory Visit (HOSPITAL_COMMUNITY)
Admission: RE | Admit: 2011-10-12 | Discharge: 2011-10-12 | Disposition: A | Discharge status: Home / Self Care | Payer: BC Managed Care – PPO | Source: Ambulatory Visit | Attending: Rheumatology | Admitting: Rheumatology

## 2011-10-12 ENCOUNTER — Other Ambulatory Visit (HOSPITAL_COMMUNITY): Payer: Self-pay | Admitting: Rheumatology

## 2011-10-12 DIAGNOSIS — R52 Pain, unspecified: Secondary | ICD-10-CM

## 2011-10-12 DIAGNOSIS — R079 Chest pain, unspecified: Secondary | ICD-10-CM | POA: Insufficient documentation

## 2011-10-22 ENCOUNTER — Encounter: Payer: Self-pay | Admitting: Family Medicine

## 2011-10-22 ENCOUNTER — Ambulatory Visit (INDEPENDENT_AMBULATORY_CARE_PROVIDER_SITE_OTHER): Payer: BC Managed Care – PPO | Admitting: Family Medicine

## 2011-10-22 VITALS — BP 144/100 | HR 72 | Temp 98.5°F | Wt 167.0 lb

## 2011-10-22 DIAGNOSIS — M069 Rheumatoid arthritis, unspecified: Secondary | ICD-10-CM | POA: Insufficient documentation

## 2011-10-22 DIAGNOSIS — E785 Hyperlipidemia, unspecified: Secondary | ICD-10-CM

## 2011-10-22 DIAGNOSIS — M0579 Rheumatoid arthritis with rheumatoid factor of multiple sites without organ or systems involvement: Secondary | ICD-10-CM | POA: Insufficient documentation

## 2011-10-22 DIAGNOSIS — I10 Essential (primary) hypertension: Secondary | ICD-10-CM

## 2011-10-22 DIAGNOSIS — Z23 Encounter for immunization: Secondary | ICD-10-CM

## 2011-10-22 MED ORDER — LISINOPRIL 20 MG PO TABS: 20.0000 mg | ORAL_TABLET | Freq: Every day | ORAL | Status: DC

## 2011-10-22 NOTE — Patient Instructions (Addendum)
It was great to see you.  We are increasing your lisinopril 20 mg daily. Please check your blood pressure at home- call me in a couple of weeks with an update.

## 2011-10-22 NOTE — Progress Notes (Addendum)
Subjective:    [name], female    DOB: 01-11-1961, 51 y.o.   MRN: [id number]  HPI  Erin Bass, a 51 y.o. female very pleasant female here for follow up.  Erin Bass has had a difficult year.    Multiple rheum labs abnormal, includring RF, CRP, SED rate. Referred to Dr. Kellie Simmering who- felt her pain was muscular- placed on prednisone and sent to PT.  Went to PT several times, which did help but she can no longer afford to go.  Steroids has helped her symptoms but they still return.    Pain was continuing to progress.  She returned here and I referred her to Dr Corliss Skains for a second opinion who immediately diagnosed her with severe RA- On prednisone and MTX.  Symptoms improved- now pain is only 2/10.  Pain mainly in right hip and wrists.  HTN- BP has been slowly increasing both a rheum office and hematology clinic.  Patient Active Problem List  Diagnosis  . HYPERLIPIDEMIA  . HYPERTENSION  . PAIN IN JOINT, MULTIPLE SITES  . FATIGUE  . WEIGHT GAIN  . PALPITATIONS  . DYSPNEA  . ANEMIA, IRON DEFICIENCY, HX OF  . POSTMENOPAUSAL STATUS  . DIZZINESS  . Generalized muscle ache  . Thigh pain  . Fatigue  . Microcytic anemia  . Myalgia and myositis  . Unspecified deficiency anemia  . Rheumatoid arthritis   Past Medical History  Diagnosis Date  . HTN (hypertension)    Past Surgical History  Procedure Date  . Partial hysterectomy 1/06    fibroids; ovaries left in   . Cesarean section     x2  . Bone spur - removal   . Abdominal hysterectomy     Partial- Uterus only   History  Substance Use Topics  . Smoking status: Never Smoker   . Smokeless tobacco: Not on file  . Alcohol Use: Not on file   Family History  Problem Relation Age of Onset  . Depression      both sides of family   . Anemia Mother    No Known Allergies Current Outpatient Prescriptions on File Prior to Visit  Medication Sig Dispense Refill  . aspirin 81 MG EC tablet Take 81 mg  by mouth daily.        . ferrous sulfate 325 (65 FE) MG tablet Take 325 mg by mouth daily. sometimes       . fish oil-omega-3 fatty acids 1000 MG capsule Take 1 g by mouth as needed. Takes for joint pain      . Multiple Vitamin (MULTIVITAMIN) tablet Take 1 tablet by mouth daily.      . predniSONE (DELTASONE) 5 MG tablet as directed. Taper      . traMADol (ULTRAM) 50 MG tablet TAKE ONE TABLET BY MOUTH EVERY 8 HOURS AS NEEDED FOR PAIN  30 tablet  0  . DISCONTD: lisinopril (PRINIVIL,ZESTRIL) 10 MG tablet Take 1 tablet (10 mg total) by mouth daily.  90 tablet  3   The PMH, PSH, Social History, Family History, Medications, and allergies have been reviewed in [facility], and have been updated if relevant.   The PMH, PSH, Social History, Family History, Medications, and allergies have been reviewed in [facility], and have been updated if relevant.   Review of Systems REVIEW OF SYSTEMS  GEN: No fevers, chills. No HA, blurred vision MSK: Detailed in the HPI GI: tolerating PO intake without difficulty Neuro: No numbness, parasthesias, or tingling associated.  Otherwise the pertinent positives of the ROS are noted above.      Objective:   Physical Exam BP 144/100  Pulse 72  Temp 98.5 F (36.9 C)  Wt 167 lb (75.751 kg)  GEN: Well-developed,well-nourished,in no acute distress; alert,appropriate and cooperative throughout examination HEENT: Normocephalic and atraumatic without obvious abnormalities. Ears, externally no deformities PULM: Breathing comfortably in no respiratory distress EXT: deforminty of right wrist PSYCH: Normally interactive. Cooperative during the interview. Pleasant. Friendly and conversant. Not anxious or depressed appearing. Normal, full affect.     Assessment & Plan:   1. Rheumatoid arthritis  Severe with joint erosion. Symptoms improved on MTX and prednisone. Given pneumovax today. Pt declined flu shot.   2. HYPERTENSION  Deteriorated- increase lisinopril to 20 mg daily. She  will call me with BP readings in 2 weeks.

## 2011-12-15 ENCOUNTER — Other Ambulatory Visit: Payer: Self-pay | Admitting: Family Medicine

## 2012-02-02 ENCOUNTER — Ambulatory Visit: Payer: BC Managed Care – PPO | Admitting: Family Medicine

## 2012-02-10 ENCOUNTER — Encounter: Payer: Self-pay | Admitting: Family Medicine

## 2012-02-10 ENCOUNTER — Ambulatory Visit (INDEPENDENT_AMBULATORY_CARE_PROVIDER_SITE_OTHER): Payer: BC Managed Care – PPO | Admitting: Family Medicine

## 2012-02-10 VITALS — BP 140/90 | HR 76 | Temp 97.6°F | Wt 169.0 lb

## 2012-02-10 DIAGNOSIS — I10 Essential (primary) hypertension: Secondary | ICD-10-CM

## 2012-02-10 DIAGNOSIS — M069 Rheumatoid arthritis, unspecified: Secondary | ICD-10-CM

## 2012-02-10 MED ORDER — OXYCODONE HCL 5 MG PO CAPS: 5.0000 mg | ORAL_CAPSULE | ORAL | Status: DC | PRN

## 2012-02-10 NOTE — Progress Notes (Signed)
Subjective:    [name], female    DOB: 1961-01-06, 52 y.o.   MRN: [id number]  HPI  [name], a 52 y.o. female very pleasant female here for follow up.  RA- sees Dr. Corliss Skains.  Was on prednisone and MTX.  MTX caused elevation in LFTs.  Prednisone seems to be no longer effective.  Pain is getting much worse.  Tramadol caused nausea. Now has to use a cane. Just applied for disability. Dr. Corliss Skains wants to start immunomodulator infusions.  HTN- increased Lisonopril to 20 mg daily in 10/2011.  States BP has been much better.  Was 120/83 at rheum appt last month. No HA, blurred vision, CP or SOB.  Patient Active Problem List  Diagnosis  . HYPERLIPIDEMIA  . HYPERTENSION  . FATIGUE  . ANEMIA, IRON DEFICIENCY, HX OF  . POSTMENOPAUSAL STATUS  . Microcytic anemia  . Unspecified deficiency anemia  . Rheumatoid arthritis   Past Medical History  Diagnosis Date  . HTN (hypertension)    Past Surgical History  Procedure Date  . Partial hysterectomy 1/06    fibroids; ovaries left in   . Cesarean section     x2  . Bone spur - removal   . Abdominal hysterectomy     Partial- Uterus only   History  Substance Use Topics  . Smoking status: Never Smoker   . Smokeless tobacco: Not on file  . Alcohol Use: Not on file   Family History  Problem Relation Age of Onset  . Depression      both sides of family   . Anemia Mother    No Known Allergies Current Outpatient Prescriptions on File Prior to Visit  Medication Sig Dispense Refill  . aspirin 81 MG EC tablet Take 81 mg by mouth daily.        . ferrous sulfate 325 (65 FE) MG tablet Take 325 mg by mouth daily. sometimes       . fish oil-omega-3 fatty acids 1000 MG capsule Take 1 g by mouth as needed. Takes for joint pain      . lisinopril (PRINIVIL,ZESTRIL) 20 MG tablet Take 1 tablet (20 mg total) by mouth daily.  90 tablet  3  . methotrexate (RHEUMATREX) 2.5 MG tablet Take 4 tablets by mouth weekly times 2  weeks, then increase to 6 tablets weekly      . Multiple Vitamin (MULTIVITAMIN) tablet Take 1 tablet by mouth daily.      . predniSONE (DELTASONE) 5 MG tablet as directed. Taper      . traMADol (ULTRAM) 50 MG tablet TAKE ONE TABLET BY MOUTH EVERY 8 HOURS AS NEEDED FOR PAIN  30 tablet  0   The PMH, PSH, Social History, Family History, Medications, and allergies have been reviewed in [facility], and have been updated if relevant.   The PMH, PSH, Social History, Family History, Medications, and allergies have been reviewed in [facility], and have been updated if relevant.   Review of Systems REVIEW OF SYSTEMS  GEN: No fevers, chills. No HA, blurred vision MSK: Detailed in the HPI GI: tolerating PO intake without difficulty Neuro: No numbness, parasthesias, or tingling associated. Otherwise the pertinent positives of the ROS are noted above.      Objective:   Physical Exam BP 140/90  Pulse 76  Temp 97.6 F (36.4 C)  Wt 169 lb (76.658 kg)  GEN: Well-developed,well-nourished,in no acute distress; alert,appropriate and cooperative throughout examination HEENT: Normocephalic and atraumatic without obvious abnormalities. Ears,  externally no deformities PULM: Breathing comfortably in no respiratory distress EXT: deforminty of right wrist PSYCH: Normally interactive. Cooperative during the interview. Pleasant. Friendly and conversant. Not anxious or depressed appearing. Normal, full affect.     Assessment & Plan:   1. Rheumatoid arthritis  Severe with joint erosion. Looking into immunomodulator infusion. Given rx for oxycodone as pain is getting quite sever.   2. HYPERTENSION  Stable on increased dose of Lisinopril. Follow up at [facility].

## 2012-02-24 ENCOUNTER — Telehealth: Payer: Self-pay

## 2012-02-24 NOTE — Telephone Encounter (Signed)
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 Dr Corliss Skains advised pt to make appt with PCP(gave pt a print out to bring to appt). Pt has no symptoms and is avoiding anyone that might have URI symptoms. Pt cannot come in until 02/29/12. Pt scheduled appt Dr Dayton Martes on 02/29/12. Advised pt if condition changes or worsens to contact office.

## 2012-02-29 ENCOUNTER — Encounter: Payer: Self-pay | Admitting: Family Medicine

## 2012-02-29 ENCOUNTER — Ambulatory Visit (INDEPENDENT_AMBULATORY_CARE_PROVIDER_SITE_OTHER): Payer: BC Managed Care – PPO | Admitting: Family Medicine

## 2012-02-29 ENCOUNTER — Ambulatory Visit (INDEPENDENT_AMBULATORY_CARE_PROVIDER_SITE_OTHER)
Admission: RE | Admit: 2012-02-29 | Discharge: 2012-02-29 | Disposition: A | Discharge status: Home / Self Care | Payer: BC Managed Care – PPO | Source: Ambulatory Visit | Attending: Family Medicine | Admitting: Family Medicine

## 2012-02-29 VITALS — BP 160/102 | HR 80 | Temp 98.0°F | Wt 170.0 lb

## 2012-02-29 DIAGNOSIS — R7611 Nonspecific reaction to tuberculin skin test without active tuberculosis: Secondary | ICD-10-CM

## 2012-02-29 DIAGNOSIS — M069 Rheumatoid arthritis, unspecified: Secondary | ICD-10-CM

## 2012-02-29 DIAGNOSIS — I10 Essential (primary) hypertension: Secondary | ICD-10-CM

## 2012-02-29 LAB — HEPATIC FUNCTION PANEL: Total Bilirubin: 0.2 mg/dL — ABNORMAL LOW (ref 0.3–1.2)

## 2012-02-29 MED ORDER — VITAMIN B-6 50 MG PO TABS: 50.0000 mg | ORAL_TABLET | Freq: Every day | ORAL | Status: DC

## 2012-02-29 MED ORDER — ISONIAZID 300 MG PO TABS: 300.0000 mg | ORAL_TABLET | Freq: Every day | ORAL | Status: DC

## 2012-02-29 MED ORDER — LISINOPRIL 20 MG PO TABS: 30.0000 mg | ORAL_TABLET | Freq: Every day | ORAL | Status: DC

## 2012-02-29 MED ORDER — TRAMADOL HCL 50 MG PO TABS: 50.0000 mg | ORAL_TABLET | Freq: Three times a day (TID) | ORAL | Status: DC | PRN

## 2012-02-29 NOTE — Progress Notes (Signed)
Subjective:    [name], female    DOB: [birth year], 52 y.o.   MRN: [id number]  HPI  Erin Bass, a 52 y.o. female very pleasant female here for follow up.  RA- sees Dr. Corliss Skains who was planning on starting her on biologic therapy but placed a PPD before initiating therapy.  Saw her on 02/22/2012 (note reviewed) for PPD reading.  According to Dr. Corliss Skains, PPD reading showed an indurated area of 15 mm on her left forearm.   She was on no medications at the time.  Dr. Corliss Skains suggested coming to see me to start TB prophylactic therapy.  Once she has been on this for three months, she will consider biologic therapy.  In the interim, she gave her rx fpr Plaquenil 200 mg twice daily but Ms. Harbach has not started it yet.  Ms. Furuta is not currently working due to her rapidly progressing RA.  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  She denies any fever, cough, or weight loss.  Wt Readings from Last 3 Encounters:  02/29/12 170 lb (77.111 kg)  02/10/12 169 lb (76.658 kg)  10/22/11 167 lb (75.751 kg)     HTN- increased Lisonopril to 20 mg daily in 10/2011.  BP was much better but has been slowly creeping up. She has had a little blurred vision this week.  No HA,  CP or SOB.  Patient Active Problem List  Diagnosis  . HYPERLIPIDEMIA  . HYPERTENSION  . POSTMENOPAUSAL STATUS  . Microcytic anemia  . Rheumatoid arthritis  . 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 HTN (hypertension)    Past Surgical History  Procedure Laterality Date  . Partial hysterectomy  1/06    fibroids; ovaries left in   . Cesarean section      x2  . Bone spur - removal    . Abdominal hysterectomy      Partial- Uterus only   History  Substance Use Topics  . Smoking status: Never Smoker   . Smokeless tobacco: Not on file  . Alcohol Use: Not on file   Family History  Problem Relation Age  of Onset  . Depression      both sides of family   . Anemia Mother    No Known Allergies Current Outpatient Prescriptions on File Prior to Visit  Medication Sig Dispense Refill  . aspirin 81 MG EC tablet Take 81 mg by mouth daily.        . ferrous sulfate 325 (65 FE) MG tablet Take 325 mg by mouth daily. sometimes       . fish oil-omega-3 fatty acids 1000 MG capsule Take 1 g by mouth as needed. Takes for joint pain      . lisinopril (PRINIVIL,ZESTRIL) 20 MG tablet Take 1 tablet (20 mg total) by mouth daily.  90 tablet  3  . Multiple Vitamin (MULTIVITAMIN) tablet Take 1 tablet by mouth daily.      Marland Kitchen oxycodone (OXY-IR) 5 MG capsule Take 1 capsule (5 mg total) by mouth every 4 (four) hours as needed.  30 capsule  0  . predniSONE (DELTASONE) 5 MG tablet as directed. Taper       No current facility-administered medications on file prior to visit.   The PMH, PSH, Social History, Family History, Medications, and allergies have been reviewed in [facility], and have been updated if  relevant.   The PMH, PSH, Social History, Family History, Medications, and allergies have been reviewed in [facility], and have been updated if relevant.   Review of Systems  See HPI    Objective:   Physical Exam BP 160/102  Pulse 80  Temp(Src) 98 F (36.7 C)  Wt 170 lb (77.111 kg)  BMI 31.6 kg/m2 BP Readings from Last 3 Encounters:  02/29/12 160/102  02/10/12 140/90  10/22/11 144/100     GEN: Well-developed,well-nourished,in no acute distress; alert,appropriate and cooperative throughout examination HEENT: Normocephalic and atraumatic without obvious abnormalities. Ears, externally no deformities PULM: Breathing comfortably in no respiratory distress EXT: deforminty of right wrist PSYCH: Normally interactive. Cooperative during the interview. Pleasant. Friendly and conversant. Not anxious or depressed appearing. Normal, full affect.     Assessment & Plan:   1. 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  May be a false positive but given that she is high risk, will start INH therapy with Vit B6 for 9 months. The patient indicates understanding of these issues and agrees with the plan.  - DG Chest 2 View; Future - HIV Antibody - Hepatic Function Panel  2. HYPERTENSION Deteriorated.  Increase Lisinopril to 30 mg daily.  Follow up in 1 month. She has not had a dilated eye exam in years- advised her to do this as soon as she can.  3. Rheumatoid arthritis Deteriorated.  Hopefully she can start a biological agent after 3 months of TB prophylaxis.

## 2012-02-29 NOTE — Patient Instructions (Addendum)
Good to see you, Erin Bass. We are starting Isoniazid 300 mg daily- please also take Vit B6 50 mg daily as directed. I am checking your liver function, chest xray and HIV today. We will call you with your results.    Please make an appointment with your eye doctor for a dilated eye exam.  We are also increasing your lisinopril to 30 mg daily (1 and 1/2 tablet daily). Please follow up with me in 1 month.   Isoniazid, INH tablets What is this medicine? ISONIAZID (eye soe NYE a zid) is used to prevent or to treat tuberculosis (TB). This medicine may be used for other purposes; ask your health care provider or pharmacist if you have questions. What should I tell my health care provider before I take this medicine? They need to know if you have any of these conditions: -diabetes -HIV positive -if you frequently drink alcohol-containing beverages -kidney disease -liver disease -malnutrition -tingling of the fingers or toes, or other nerve disorder -an unusual or allergic reaction to isoniazid, other medicines, foods, dyes or preservatives -pregnant or trying to get pregnant -breast-feeding How should I use this medicine? Take this medicine by mouth with a glass of water. Follow the directions on the prescription label. Take this medicine on an empty stomach, at least 30 minutes before or 2 hours after food. Do not take with food. Take your medicine at regular intervals. Do not take your medicine more often than directed. For your therapy to work as well as possible, take each dose exactly as prescribed. Do not skip doses or stop your medicine even if you feel better. Skipping doses may make the TB resistant to this medicine and other medicines. Do not stop taking except on your doctor's advice. Talk to your pediatrician regarding the use of this medicine in children. Special care may be needed. Overdosage: If you think you have taken too much of this medicine contact a poison control center or  emergency room at once. NOTE: This medicine is only for you. Do not share this medicine with others. What if I miss a dose? If you miss a dose, take it as soon as you can. If it is almost time for your next dose, take only that dose. Do not take double or extra doses. What may interact with this medicine? Do not take this medicine with any of the following medications: -entacapone -green tea -levodopa -MAOIs like Carbex, Eldepryl, Marplan, Nardil, and Parnate -procarbazine -ranolazine -tolcapone This medicine may also interact with the following medications: -acetaminophen -alcohol -antacids -medicines for diabetes -medicines for fungal infections like ketoconazole and itraconazole -medicines for seizures like carbamazepine, phenobarbital, phenytoin, valproic acid -theophylline -zalcitabine This list may not describe all possible interactions. Give your health care provider a list of all the medicines, herbs, non-prescription drugs, or dietary supplements you use. Also tell them if you smoke, drink alcohol, or use illegal drugs. Some items may interact with your medicine. What should I watch for while using this medicine? Visit your doctor or health care professional for regular check ups. You will need blood work done regularly. You may need to take vitamin supplements while on this medicine. Talk to your doctor about the foods you eat and the vitamins you take. Avoid antacids for 2 hours before and after taking a dose of this medicine. Alcohol may interfere with the effect of this medicine. Avoid alcoholic drinks. If you are diabetic check your blood sugar as directed. Also, you may get a false-positive  result for sugar in your urine. Talk with your doctor. What side effects may I notice from receiving this medicine? Side effects that you should report to your doctor or health care professional as soon as possible: -allergic reactions like skin rash, itching or hives, swelling of the  face, lips, or tongue -breathing problems -changes in vision or eye pain -dark urine -fever, sore throat -hallucination, loss of contact with reality -loss of appetite -memory problems -nausea, vomiting -pain, tingling, numbness in the hands or feet -redness, blistering, peeling or loosening of the skin, including inside the mouth -seizures -stomach pain -unusually weak or tired -yellowing of the eyes or skin Side effects that usually do not require medical attention (report to your doctor or health care professional if they continue or are bothersome): -breast enlargement or tenderness -diarrhea -headache -upset stomach -trouble sleeping This list may not describe all possible side effects. Call your doctor for medical advice about side effects. You may report side effects to FDA at 1-800-FDA-1088. Where should I keep my medicine? Keep out of the reach of children. Store at room temperature between 15 and 30 degrees C (59 and 86 degrees F). Protect from light. Keep container tightly closed. Throw away any unused medicine after the expiration date. NOTE: This sheet is a summary. It may not cover all possible information. If you have questions about this medicine, talk to your doctor, pharmacist, or health care provider.  2012, [employer]. (05/24/2007 3:56:13 PM)

## 2012-03-01 LAB — HIV ANTIBODY (ROUTINE TESTING W REFLEX): HIV: NONREACTIVE

## 2012-03-12 ENCOUNTER — Encounter: Payer: Self-pay | Admitting: Internal Medicine

## 2012-03-12 ENCOUNTER — Telehealth: Payer: Self-pay | Admitting: Internal Medicine

## 2012-03-12 NOTE — Telephone Encounter (Signed)
Pt does not want to come anymore, a former Dr. Dalene Carrow pt

## 2012-03-20 ENCOUNTER — Other Ambulatory Visit: Payer: Self-pay | Admitting: Family Medicine

## 2012-03-21 ENCOUNTER — Telehealth: Payer: Self-pay | Admitting: *Deleted

## 2012-03-21 MED ORDER — TRAMADOL HCL 50 MG PO TABS: ORAL_TABLET | ORAL | Status: DC

## 2012-03-21 NOTE — Telephone Encounter (Signed)
Dosage increased and sent to [facility]. Please let us know if this helps.

## 2012-03-21 NOTE — Telephone Encounter (Signed)
Advised patient

## 2012-03-21 NOTE — Telephone Encounter (Signed)
Pt wants to have tramadol strength or dosing increased, her pain has increased recently, pain is more severe and current dose isn't effective.

## 2012-03-30 ENCOUNTER — Encounter: Payer: Self-pay | Admitting: Family Medicine

## 2012-03-30 ENCOUNTER — Ambulatory Visit (INDEPENDENT_AMBULATORY_CARE_PROVIDER_SITE_OTHER): Payer: BC Managed Care – PPO | Admitting: Family Medicine

## 2012-03-30 VITALS — BP 160/100 | HR 88 | Temp 98.0°F | Wt 165.0 lb

## 2012-03-30 DIAGNOSIS — M069 Rheumatoid arthritis, unspecified: Secondary | ICD-10-CM

## 2012-03-30 DIAGNOSIS — I10 Essential (primary) hypertension: Secondary | ICD-10-CM

## 2012-03-30 DIAGNOSIS — R7611 Nonspecific reaction to tuberculin skin test without active tuberculosis: Secondary | ICD-10-CM

## 2012-03-30 MED ORDER — OXYCODONE HCL 5 MG PO CAPS: 10.0000 mg | ORAL_CAPSULE | ORAL | Status: DC | PRN

## 2012-03-30 MED ORDER — DEXAMETHASONE SOD PHOSPHATE PF 10 MG/ML IJ SOLN
10.0000 mg | Freq: Once | INTRAMUSCULAR | Status: AC
  Administered 2012-03-30: 10 mg via INTRAMUSCULAR
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
## 2012-03-30 NOTE — Patient Instructions (Addendum)
Good to see you. Please call me at the end of the week with your blood pressure readings.    Please come see me in 2 months.

## 2012-03-30 NOTE — Addendum Note (Signed)
Addended by: Eliezer Bottom on: 03/30/2012 11:17 AM   Modules accepted: Orders

## 2012-04-06 ENCOUNTER — Telehealth: Payer: Self-pay | Admitting: Family Medicine

## 2012-04-06 MED ORDER — LISINOPRIL-HYDROCHLOROTHIAZIDE 10-12.5 MG PO TABS: 1.0000 | ORAL_TABLET | Freq: Every day | ORAL | Status: DC

## 2012-04-06 NOTE — Telephone Encounter (Signed)
Pt in office.  States BP has been elevated "for awhile".  Most current BP today 160/100.  Offered her an appt to be seen per Dr. Dayton Martes but pt refused, says that she just saw Dr. Dayton Martes and would like for her to call her instead.  [phone]

## 2012-04-06 NOTE — Telephone Encounter (Signed)
Advised patient as instructed.  She states that for the past 2 or 3 days she has been waking up with a slight headache, doesn't last very long.  She is asking if it could be because she takes 2 ibuprofen.  Advised her probably not.

## 2012-04-06 NOTE — Telephone Encounter (Signed)
Advised patient

## 2012-04-06 NOTE — Telephone Encounter (Signed)
Returned patient's call.  Left voicemail.  Asked her to call me back.  We had been increasing her Lisinopril but felt her BP was worsening due to pain.  She was going to call me if it did not improve.  Since it has not, we do need to increase her blood pressure medication again.  STOP taking her lisinopril and I will send a combination pill to her pharmacy that has lisinopril and another medication in it.  I know in past we thought HCTZ could be contributing to her joint pain but now we know it is RA. If ok with her, I would like to retry this.  Rx sent. Call us on Friday with an update.  Please make sure she is not having any HA, blurred vision, CP or SOB.

## 2012-04-06 NOTE — Telephone Encounter (Signed)
Hopefully this will improve as her BP improves.

## 2012-04-13 ENCOUNTER — Telehealth: Payer: Self-pay

## 2012-04-13 DIAGNOSIS — M069 Rheumatoid arthritis, unspecified: Secondary | ICD-10-CM

## 2012-04-13 NOTE — Telephone Encounter (Signed)
Referral placed.

## 2012-04-13 NOTE — Telephone Encounter (Signed)
Pt left v/m requesting referral to rheumatologist; Dr Dareen Piano on [facility] [place], phone [phone] and fax [phone].Please advise.

## 2012-05-15 ENCOUNTER — Other Ambulatory Visit: Payer: Self-pay | Admitting: Family Medicine

## 2012-05-31 ENCOUNTER — Other Ambulatory Visit: Payer: Self-pay | Admitting: Family Medicine

## 2012-06-02 ENCOUNTER — Encounter: Payer: Self-pay | Admitting: Family Medicine

## 2012-06-02 ENCOUNTER — Ambulatory Visit (INDEPENDENT_AMBULATORY_CARE_PROVIDER_SITE_OTHER): Payer: BC Managed Care – PPO | Admitting: Family Medicine

## 2012-06-02 VITALS — BP 130/87 | HR 76 | Temp 98.0°F | Wt 158.0 lb

## 2012-06-02 DIAGNOSIS — M069 Rheumatoid arthritis, unspecified: Secondary | ICD-10-CM

## 2012-06-02 DIAGNOSIS — I10 Essential (primary) hypertension: Secondary | ICD-10-CM

## 2012-06-02 MED ORDER — TRAMADOL HCL 50 MG PO TABS: ORAL_TABLET | ORAL | Status: DC

## 2012-06-02 NOTE — Patient Instructions (Addendum)
Good to see you. Please keep me posted with Simponi- you can call me.

## 2012-06-02 NOTE — Progress Notes (Signed)
Subjective:    [name], female    DOB: [birth year], 52 y.o.   MRN: [id number]  HPI  Erin Bass, a 52 y.o. female very pleasant female here for two month follow up.  RA-   Unfortunately she has had a difficult course with her RA.  Was seeing Dr. Corliss Skains who was planning on starting her on biologic therapy but placed a PPD before initiating therapy.  Saw her on 02/22/2012 (note reviewed) for PPD reading.  According to Dr. Corliss Skains, PPD reading showed an indurated area of 15 mm on her left forearm.   She was on no medications at the time.  Dr. Corliss Skains suggested coming to see me to start TB prophylactic therapy.  Once she has been on this for three months, she will consider biologic therapy.  In the interim, she gave her rx fpr Plaquenil 200 mg twice daily but Erin Bass did not start the plaquenil.  She wants to proceed with biological threapy.  She therefore changed to Dr. Dareen Piano who started her on Simponi last month.  She gets her next infusion in a couple of weeks.  Has not noticed any improvement yet.    She is taking her INH and Vit B6 every day.    She has started  HTN- changed to lisinopril-HCTZ in March. No HA,  CP or SOB.  Patient Active Problem List   Diagnosis Date Noted  . 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 Rheumatoid arthritis 10/22/2011  . Microcytic anemia 05/28/2011  . POSTMENOPAUSAL STATUS 03/28/2009  . HYPERLIPIDEMIA 01/24/2008  . HYPERTENSION 09/06/2006   Past Medical History  Diagnosis Date  . HTN (hypertension)    Past Surgical History  Procedure Laterality Date  . Partial hysterectomy  1/06    fibroids; ovaries left in   . Cesarean section      x2  . Bone spur - removal    . Abdominal hysterectomy      Partial- Uterus only   History  Substance Use Topics  . Smoking status: Never Smoker   . Smokeless tobacco: Not on file  . Alcohol Use: Not on file   Family History  Problem Relation Age of Onset  . Depression       both sides of family   . Anemia Mother    No Known Allergies Current Outpatient Prescriptions on File Prior to Visit  Medication Sig Dispense Refill  . aspirin 81 MG EC tablet Take 81 mg by mouth daily.        . ferrous sulfate 325 (65 FE) MG tablet Take 325 mg by mouth daily. sometimes       . fish oil-omega-3 fatty acids 1000 MG capsule Take 1 g by mouth as needed. Takes for joint pain      . isoniazid (NYDRAZID) 300 MG tablet Take 1 tablet (300 mg total) by mouth daily.  30 tablet  6  . lisinopril-hydrochlorothiazide (PRINZIDE,ZESTORETIC) 10-12.5 MG per tablet Take 1 tablet by mouth daily.  90 tablet  3  . Multiple Vitamin (MULTIVITAMIN) tablet Take 1 tablet by mouth daily.      Marland Kitchen oxycodone (OXY-IR) 5 MG capsule Take 2 capsules (10 mg total) by mouth every 4 (four) hours as needed.  90 capsule  0  . predniSONE (DELTASONE) 5 MG tablet as directed. Taper      . pyridOXINE (VITAMIN B-6) 50 MG tablet Take 1 tablet (50 mg total) by mouth daily.  30 tablet  6  .  traMADol (ULTRAM) 50 MG tablet TAKE ONE TO TWO TABLETS BY MOUTH EVERY 8 HOURS AS NEEDED FOR PAIN  90 tablet  0   No current facility-administered medications on file prior to visit.   The PMH, PSH, Social History, Family History, Medications, and allergies have been reviewed in [facility], and have been updated if relevant.   Review of Systems  See HPI    Objective:   Physical Exam There were no vitals taken for this visit. BP Readings from Last 3 Encounters:  03/30/12 160/100  02/29/12 160/102  02/10/12 140/90     GEN: Well-developed,well-nourished,in no acute distress; alert,appropriate and cooperative throughout examination HEENT: Normocephalic and atraumatic without obvious abnormalities. Ears, externally no deformities PULM: Breathing comfortably in no respiratory distress EXT: deforminty of right wrist PSYCH: Normally interactive. Cooperative during the interview. Pleasant. Friendly and conversant. Not anxious or  depressed appearing. Normal, full affect.     Assessment & Plan:    1. Rheumatoid arthritis Now followed by Dr. Dareen Piano.  Hopefully Simponi will help.  She will keep me posted. Tramadol rx refilled.  2. HYPERTENSION Stable on current dose of lisinopril-HCTZ.

## 2012-06-28 ENCOUNTER — Other Ambulatory Visit: Payer: BC Managed Care – PPO | Admitting: Lab

## 2012-06-28 ENCOUNTER — Ambulatory Visit: Payer: BC Managed Care – PPO | Admitting: Hematology and Oncology

## 2012-07-04 ENCOUNTER — Ambulatory Visit: Payer: BC Managed Care – PPO | Admitting: Internal Medicine

## 2012-07-04 ENCOUNTER — Other Ambulatory Visit: Payer: BC Managed Care – PPO | Admitting: Lab

## 2012-07-06 ENCOUNTER — Other Ambulatory Visit: Payer: Self-pay | Admitting: *Deleted

## 2012-07-06 MED ORDER — TRAMADOL HCL 50 MG PO TABS: ORAL_TABLET | ORAL | Status: DC

## 2012-07-06 NOTE — Telephone Encounter (Signed)
Last filled 05/31/12 

## 2012-07-25 ENCOUNTER — Other Ambulatory Visit: Payer: Self-pay | Admitting: *Deleted

## 2012-07-25 MED ORDER — TRAMADOL HCL 50 MG PO TABS: ORAL_TABLET | ORAL | Status: DC

## 2012-07-25 NOTE — Telephone Encounter (Signed)
Last filled 07/06/12 

## 2012-08-16 ENCOUNTER — Other Ambulatory Visit: Payer: Self-pay

## 2012-08-16 MED ORDER — TRAMADOL HCL 50 MG PO TABS: ORAL_TABLET | ORAL | Status: DC

## 2012-08-16 NOTE — Telephone Encounter (Signed)
Pt request refill tramadol for [facility]; pt having pain due to hip and knee pain.Please advise. Pt also having non productive cough but pt wanted to wait until after 08/26/12 to schedule appt due to finances; if condition changes or worsens prior to appt on 09/01/12 pt will cb .

## 2012-08-16 NOTE — Telephone Encounter (Signed)
Medication phoned to pharmacy.  

## 2012-09-01 ENCOUNTER — Ambulatory Visit (INDEPENDENT_AMBULATORY_CARE_PROVIDER_SITE_OTHER): Payer: BC Managed Care – PPO | Admitting: Family Medicine

## 2012-09-01 ENCOUNTER — Encounter: Payer: Self-pay | Admitting: Family Medicine

## 2012-09-01 VITALS — BP 130/90 | HR 76 | Temp 98.2°F | Ht 61.5 in | Wt 160.0 lb

## 2012-09-01 DIAGNOSIS — R05 Cough: Secondary | ICD-10-CM

## 2012-09-01 DIAGNOSIS — R059 Cough, unspecified: Secondary | ICD-10-CM

## 2012-09-01 DIAGNOSIS — M069 Rheumatoid arthritis, unspecified: Secondary | ICD-10-CM

## 2012-09-01 LAB — COMPREHENSIVE METABOLIC PANEL
ALT: 142 U/L — ABNORMAL HIGH (ref 0–35)
Albumin: 4 g/dL (ref 3.5–5.2)
CO2: 31 mEq/L (ref 19–32)
Calcium: 9.2 mg/dL (ref 8.4–10.5)
Chloride: 104 mEq/L (ref 96–112)
GFR: 81.22 mL/min (ref >60.00)
Glucose, Bld: 74 mg/dL (ref 70–99)
Potassium: 3.7 mEq/L (ref 3.5–5.1)
Sodium: 138 mEq/L (ref 135–145)
Total Protein: 7 g/dL (ref 6.0–8.3)

## 2012-09-01 LAB — CBC WITH DIFFERENTIAL/PLATELET
Basophils Absolute: 0.1 10*3/uL (ref 0.0–0.1)
Eosinophils Relative: 3.8 % (ref 0.0–5.0)
HCT: 36.9 % (ref 36.0–46.0)
Lymphocytes Relative: 23.9 % (ref 12.0–46.0)
Monocytes Relative: 10.2 % (ref 3.0–12.0)
Platelets: 239 10*3/uL (ref 150.0–400.0)
RDW: 14.5 % (ref 11.5–14.6)
WBC: 8.2 10*3/uL (ref 4.5–10.5)

## 2012-09-01 NOTE — Progress Notes (Signed)
Subjective:    [name], female    DOB: Sep 05, 1960, 52 y.o.   MRN: [id number]  HPI  Erin Bass, a 52 y.o. female very pleasant female here for two month follow up.  RA-   Unfortunately she has had a difficult course with her RA but she has been doing better.    Now seeing Dr. Dareen Piano.  He started her on Simponi earlier this summer.  She gets her next infusion in a couple of weeks.  Has not noticed any improvement yet.    She is taking her INH and Vit B6 every day- course will end at end of month.    Had a URI earlier this month and cough has continued to linger although getting better.  Was productive, now dry.  Does take ACEI.  Patient Active Problem List   Diagnosis Date Noted  . Cough 09/01/2012  . 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 Rheumatoid arthritis 10/22/2011  . Microcytic anemia 05/28/2011  . POSTMENOPAUSAL STATUS 03/28/2009  . HYPERLIPIDEMIA 01/24/2008  . HYPERTENSION 09/06/2006   Past Medical History  Diagnosis Date  . HTN (hypertension)    Past Surgical History  Procedure Laterality Date  . Partial hysterectomy  1/06    fibroids; ovaries left in   . Cesarean section      x2  . Bone spur - removal    . Abdominal hysterectomy      Partial- Uterus only   History  Substance Use Topics  . Smoking status: Never Smoker   . Smokeless tobacco: Not on file  . Alcohol Use: Not on file   Family History  Problem Relation Age of Onset  . Depression      both sides of family   . Anemia Mother    No Known Allergies Current Outpatient Prescriptions on File Prior to Visit  Medication Sig Dispense Refill  . aspirin 81 MG EC tablet Take 81 mg by mouth daily.       . ferrous sulfate 325 (65 FE) MG tablet Take 325 mg by mouth daily. sometimes       . fish oil-omega-3 fatty acids 1000 MG capsule Take 1 g by mouth as needed. Takes for joint pain      . golimumab (SIMPONI ARIA) 50 MG/4ML SOLN Inject into the vein.      Marland Kitchen isoniazid  (NYDRAZID) 300 MG tablet Take 1 tablet (300 mg total) by mouth daily.  30 tablet  6  . lisinopril-hydrochlorothiazide (PRINZIDE,ZESTORETIC) 10-12.5 MG per tablet Take 1 tablet by mouth daily.  90 tablet  3  . Multiple Vitamin (MULTIVITAMIN) tablet Take 1 tablet by mouth daily.      . predniSONE (DELTASONE) 5 MG tablet as directed. Taper      . pyridOXINE (VITAMIN B-6) 50 MG tablet Take 1 tablet (50 mg total) by mouth daily.  30 tablet  6  . traMADol (ULTRAM) 50 MG tablet TAKE ONE TO TWO TABLETS BY MOUTH EVERY 8 HOURS AS NEEDED FOR PAIN  90 tablet  0   No current facility-administered medications on file prior to visit.   The PMH, PSH, Social History, Family History, Medications, and allergies have been reviewed in [facility], and have been updated if relevant.   Review of Systems  See HPI    Objective:   Physical Exam BP 130/90  Pulse 76  Temp(Src) 98.2 F (36.8 C)  Ht 5' 1.5" (1.562 m)  Wt 160 lb (72.576 kg)  BMI 29.75  kg/m2 BP Readings from Last 3 Encounters:  09/01/12 130/90  06/02/12 130/87  03/30/12 160/100     GEN: Well-developed,well-nourished,in no acute distress; alert,appropriate and cooperative throughout examination HEENT: Normocephalic and atraumatic without obvious abnormalities. Ears, externally no deformities PULM: Breathing comfortably in no respiratory distress EXT: deforminty of right wrist PSYCH: Normally interactive. Cooperative during the interview. Pleasant. Friendly and conversant. Not anxious or depressed appearing. Normal, full affect.     Assessment & Plan:    1. Rheumatoid arthritis Now followed by Dr. Dareen Piano.  Symptoms seem to be improving. Orders Placed This Encounter  Procedures  . CBC with Differential  . Comprehensive metabolic panel    2. cough Likely due to URI.  If symptoms persist, consider trial off ACEI. The patient indicates understanding of these issues and agrees with the plan.

## 2012-09-01 NOTE — Patient Instructions (Addendum)
Good to see you. I think this is likely a virus with a prolonged cough.  Keep me posted if your cough continues.  We will call you with your lab results.

## 2012-09-06 ENCOUNTER — Other Ambulatory Visit: Payer: Self-pay

## 2012-09-06 MED ORDER — TRAMADOL HCL 50 MG PO TABS: ORAL_TABLET | ORAL | Status: DC

## 2012-09-06 NOTE — Telephone Encounter (Signed)
Pt left v/m requesting refill tramadol [facility].Please advise.

## 2012-09-06 NOTE — Telephone Encounter (Signed)
Refill called to [facility]. 

## 2012-09-29 ENCOUNTER — Other Ambulatory Visit: Payer: Self-pay | Admitting: *Deleted

## 2012-09-29 MED ORDER — TRAMADOL HCL 50 MG PO TABS: ORAL_TABLET | ORAL | Status: DC

## 2012-09-29 NOTE — Telephone Encounter (Signed)
Pt calls requesting refill of Tramadol. 

## 2012-09-29 NOTE — Telephone Encounter (Signed)
Refill called to [facility]. 

## 2012-10-25 ENCOUNTER — Other Ambulatory Visit: Payer: Self-pay

## 2012-10-25 MED ORDER — TRAMADOL HCL 50 MG PO TABS: ORAL_TABLET | ORAL | Status: DC

## 2012-10-25 NOTE — Telephone Encounter (Signed)
Pt left v/m requesting refill Tramadol to [facility].Please advise.

## 2012-10-25 NOTE — Telephone Encounter (Signed)
Medication phoned to [facility] pharmacy as instructed.

## 2012-11-15 ENCOUNTER — Encounter: Payer: Self-pay | Admitting: Family Medicine

## 2012-11-15 ENCOUNTER — Other Ambulatory Visit: Payer: Self-pay | Admitting: Family Medicine

## 2012-11-15 NOTE — Telephone Encounter (Signed)
MyChart Refill Request.  Please advise.  

## 2012-11-16 MED ORDER — TRAMADOL HCL 50 MG PO TABS: ORAL_TABLET | ORAL | Status: DC

## 2012-11-16 NOTE — Telephone Encounter (Signed)
Rx called to pharmacy as instructed. 

## 2012-11-16 NOTE — Telephone Encounter (Signed)
Please call in

## 2012-11-24 ENCOUNTER — Other Ambulatory Visit: Payer: Self-pay

## 2012-12-02 ENCOUNTER — Ambulatory Visit (INDEPENDENT_AMBULATORY_CARE_PROVIDER_SITE_OTHER): Payer: BC Managed Care – PPO | Admitting: Internal Medicine

## 2012-12-02 ENCOUNTER — Encounter: Payer: Self-pay | Admitting: Internal Medicine

## 2012-12-02 VITALS — BP 160/100 | HR 77 | Temp 97.8°F | Ht 62.0 in | Wt 151.1 lb

## 2012-12-02 DIAGNOSIS — E785 Hyperlipidemia, unspecified: Secondary | ICD-10-CM

## 2012-12-02 DIAGNOSIS — Z01810 Encounter for preprocedural cardiovascular examination: Secondary | ICD-10-CM

## 2012-12-02 DIAGNOSIS — I10 Essential (primary) hypertension: Secondary | ICD-10-CM

## 2012-12-02 LAB — LIPID PANEL
HDL: 110.3 mg/dL (ref >39.00)
Total CHOL/HDL Ratio: 3
Triglycerides: 44 mg/dL (ref 0.0–149.0)
VLDL: 8.8 mg/dL (ref 0.0–40.0)

## 2012-12-02 LAB — LDL CHOLESTEROL, DIRECT: Direct LDL: 142.9 mg/dL

## 2012-12-02 LAB — COMPREHENSIVE METABOLIC PANEL
ALT: 31 U/L (ref 0–35)
Albumin: 4.2 g/dL (ref 3.5–5.2)
CO2: 30 mEq/L (ref 19–32)
Calcium: 9.6 mg/dL (ref 8.4–10.5)
Chloride: 106 mEq/L (ref 96–112)
GFR: 96.55 mL/min (ref >60.00)
Potassium: 3.6 mEq/L (ref 3.5–5.1)
Sodium: 141 mEq/L (ref 135–145)
Total Protein: 7.2 g/dL (ref 6.0–8.3)

## 2012-12-02 LAB — CBC
RDW: 13.5 % (ref 11.5–14.6)
WBC: 7.5 10*3/uL (ref 4.5–10.5)
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
## 2012-12-02 NOTE — Patient Instructions (Signed)
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
## 2012-12-02 NOTE — Assessment & Plan Note (Signed)
Lipids from 2012 reviewed, ratio of 2 Pt was to stop statin and have lipids rechecked in 3 months It has not been checked since that time- will recheck today

## 2012-12-07 ENCOUNTER — Other Ambulatory Visit: Payer: Self-pay | Admitting: Family Medicine

## 2012-12-08 MED ORDER — TRAMADOL HCL 50 MG PO TABS: ORAL_TABLET | ORAL | Status: DC

## 2012-12-08 NOTE — Telephone Encounter (Signed)
She is having her surgery on 12/2, we will refill it this last time before her surgery but after her surgery we will not be filling more than every 30 days. Ok to phone in

## 2012-12-08 NOTE — Telephone Encounter (Signed)
rx called into pharmacy Left message on cell VM with results, advised pt to call if any questions

## 2012-12-08 NOTE — Telephone Encounter (Signed)
Spoke with patient and explained that she is too early for a refill and she states she takes 2 tablets every 8 hours which would be 6 tablets per day, but she then stated she takes 4 tablets a day and she must take it everyday for her hip pain, and she's supposed to have hip replacement surgery soon? I expained again that she was early and she stated she's out and need more.  Her previous refills have been early 09/06/12 09/29/12 10/25/12 11/16/12

## 2012-12-08 NOTE — Addendum Note (Signed)
Addended by: Sueanne Margarita on: 12/08/2012 01:09 PM   Modules accepted: Orders, Medications

## 2012-12-08 NOTE — Telephone Encounter (Signed)
Routed to RB who pt last

## 2012-12-12 ENCOUNTER — Other Ambulatory Visit (HOSPITAL_COMMUNITY): Payer: Self-pay | Admitting: *Deleted

## 2012-12-12 ENCOUNTER — Encounter (HOSPITAL_COMMUNITY): Payer: Self-pay | Admitting: Pharmacy Technician

## 2012-12-14 ENCOUNTER — Encounter (HOSPITAL_COMMUNITY): Payer: Self-pay

## 2012-12-14 ENCOUNTER — Telehealth: Payer: Self-pay

## 2012-12-14 ENCOUNTER — Encounter (HOSPITAL_COMMUNITY)
Admission: RE | Admit: 2012-12-14 | Discharge: 2012-12-14 | Disposition: A | Discharge status: Home / Self Care | Payer: BC Managed Care – PPO | Source: Ambulatory Visit | Attending: Orthopedic Surgery | Admitting: Orthopedic Surgery

## 2012-12-14 DIAGNOSIS — Z01818 Encounter for other preprocedural examination: Secondary | ICD-10-CM | POA: Insufficient documentation

## 2012-12-14 DIAGNOSIS — Z01812 Encounter for preprocedural laboratory examination: Secondary | ICD-10-CM | POA: Insufficient documentation

## 2012-12-14 HISTORY — DX: Unspecified osteoarthritis, unspecified site: M19.90

## 2012-12-14 HISTORY — DX: Anemia, unspecified: D64.9

## 2012-12-14 LAB — CBC
HCT: 34.5 % — ABNORMAL LOW (ref 36.0–46.0)
Hemoglobin: 11.3 g/dL — ABNORMAL LOW (ref 12.0–15.0)
MCH: 23.8 pg — ABNORMAL LOW (ref 26.0–34.0)
MCHC: 32.8 g/dL (ref 30.0–36.0)
MCV: 72.8 fL — ABNORMAL LOW (ref 78.0–100.0)
RDW: 13.1 % (ref 11.5–15.5)

## 2012-12-14 LAB — PROTIME-INR: INR: 1 (ref 0.00–1.49)

## 2012-12-14 LAB — URINALYSIS, ROUTINE W REFLEX MICROSCOPIC
Bilirubin Urine: NEGATIVE
Nitrite: NEGATIVE
Specific Gravity, Urine: 1.02 (ref 1.005–1.030)
Urobilinogen, UA: 1 mg/dL (ref 0.0–1.0)
pH: 6.5 (ref 5.0–8.0)

## 2012-12-14 LAB — URINE MICROSCOPIC-ADD ON

## 2012-12-14 LAB — NO BLOOD PRODUCTS

## 2012-12-14 NOTE — Telephone Encounter (Signed)
Erin Bass with [facility] left v/m;pt is scheduled for hip replacement on 12/20/12 and Erin Bass has not received letter for medical clearance. Erin Bass request medical clearance letter faxed to [phone].Please advise.

## 2012-12-14 NOTE — Telephone Encounter (Signed)
Erin Bass should have it, I signed it today and put it in my outbox

## 2012-12-14 NOTE — Patient Instructions (Signed)
20      Your procedure is scheduled on:  Tuesday 12/20/2012  Report to [facility] at  0515 AM.  Call this number if you have problems the night before or morning of surgery: [phone]   Remember:             IF YOU USE CPAP,BRING MASK AND TUBING AM OF SURGERY!             IF YOU DO NOT HAVE YOUR TYPE AND SCREEN DRAWN AT PRE-ADMIT APPOINTMENT, YOU WILL HAVE IT DRAWN AM OF SURGERY!   Do not eat food or drink liquids AFTER MIDNIGHT!  Take these medicines the morning of surgery with A SIP OF WATER: NONE    [facility] IS NOT RESPONSIBLE FOR ANY BELONGINGS OR VALUABLES BROUGHT TO HOSPITAL.  Marland Kitchen  Leave suitcase in the car. After surgery it may be brought to your room.  For patients admitted to the hospital, checkout time is 11:00 AM the day of              Discharge.    DO NOT WEAR JEWELRY,MAKE-UP,LOTIONS,POWDERS,PERFUMES,CONTACTS , DENTURES OR BRIDGEWORK ,AND DO NOT WEAR FALSE EYELASHES                                    Patients discharged the day of surgery will not be allowed to drive home. If going home the same day of surgery, must have someone stay with you first 24 hrs.at home and arrange for someone to drive you home from the Hospital.                       YOUR DRIVER IS:N/A   Special Instructions:              Please read over the following fact sheets that you were given:             1.  PREPARING FOR SURGERY SHEET              2.INCENTIVE SPIROMETRY                                        Luther.Birtie Fellman,RN,BSN     [phone]                FAILURE TO FOLLOW THESE INSTRUCTIONS MAY RESULT IN  CANCELLATION OF YOUR SURGERY!               Patient Signature:___________________________

## 2012-12-14 NOTE — Telephone Encounter (Signed)
Erin Bass has already faxed.

## 2012-12-19 NOTE — H&P (Signed)
TOTAL HIP ADMISSION H&P  Patient is admitted for right total hip arthroplasty, anterior approach.  Subjective:  Chief Complaint:    Right hip OA / pain  HPI: Erin Bass, 52 y.o. female, has a history of pain and functional disability in the right hip(s) due to arthritis and patient has failed non-surgical conservative treatments for greater than 12 weeks to include NSAID's and/or analgesics, corticosteriod injections, use of assistive devices and activity modification.  Onset of symptoms was gradual starting 3+ years ago with gradually worsening course since that time.The patient noted no past surgery on the right hip(s).  Patient currently rates pain in the right hip at 9 out of 10 with activity. Patient has night pain, worsening of pain with activity and weight bearing, trendelenberg gait, pain that interfers with activities of daily living and pain with passive range of motion. Patient has evidence of periarticular osteophytes and joint space narrowing by imaging studies. This condition presents safety issues increasing the risk of falls.  There is no current active infection.  Risks, benefits and expectations were discussed with the patient.  Risks including but not limited to the risk of anesthesia, blood clots, nerve damage, blood vessel damage, failure of the prosthesis, infection and up to and including death.  Patient understand the risks, benefits and expectations and wishes to proceed with surgery.   D/C Plans:   Home with HHPT  Post-op Meds:    No Rx given  Tranexamic Acid:   To be given  Decadron:    To be given  FYI:    ASA post-op  Norco post-op   Patient Active Problem List   Diagnosis Date Noted  . 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 Rheumatoid arthritis 10/22/2011  . Microcytic anemia 05/28/2011  . POSTMENOPAUSAL STATUS 03/28/2009  . HYPERLIPIDEMIA 01/24/2008  . HYPERTENSION 09/06/2006   Past Medical History  Diagnosis Date  . HTN (hypertension)   .  Arthritis   . Anemia     Past Surgical History  Procedure Laterality Date  . Partial hysterectomy  1/06    fibroids; ovaries left in   . Cesarean section      x2  . Bone spur - removal    . Abdominal hysterectomy      Partial- Uterus only  . Tubal ligation      No prescriptions prior to admission   No Known Allergies   History  Substance Use Topics  . Smoking status: Never Smoker   . Smokeless tobacco: Not on file  . Alcohol Use: Yes     Comment: occassionally    Family History  Problem Relation Age of Onset  . Depression      both sides of family   . Anemia Mother      Review of Systems  Constitutional: Negative.   HENT: Negative.   Eyes: Negative.   Respiratory: Negative.   Cardiovascular: Negative.   Gastrointestinal: Negative.   Genitourinary: Negative.   Musculoskeletal: Positive for joint pain.  Skin: Negative.   Neurological: Negative.   Endo/Heme/Allergies: Negative.   Psychiatric/Behavioral: Negative.     Objective:  Physical Exam  Constitutional: She is oriented to person, place, and time. She appears well-developed and well-nourished.  HENT:  Head: Normocephalic and atraumatic.  Mouth/Throat: Oropharynx is clear and moist.  Eyes: Pupils are equal, round, and reactive to light.  Neck: Neck supple. No JVD present. No tracheal deviation present. No thyromegaly present.  Cardiovascular: Normal rate, regular rhythm, normal heart sounds and  intact distal pulses.   Respiratory: Effort normal. No stridor. No respiratory distress. She has no wheezes.  GI: Soft. There is no tenderness. There is no guarding.  Musculoskeletal:       Right hip: She exhibits decreased range of motion, decreased strength, tenderness and bony tenderness. She exhibits no swelling, no deformity and no laceration.  Lymphadenopathy:    She has no cervical adenopathy.  Neurological: She is alert and oriented to person, place, and time.  Skin: Skin is warm and dry.  Psychiatric:  She has a normal mood and affect.     Labs:  Estimated body mass index is 29.75 kg/(m^2) as calculated from the following:   Height as of 09/01/12: 5' 1.5" (1.562 m).   Weight as of 09/01/12: 72.576 kg (160 lb).   Imaging Review Plain radiographs demonstrate severe degenerative joint disease of the right hip(s). The bone quality appears to be good for age and reported activity level.  Assessment/Plan:  End stage arthritis, right hip(s)  The patient history, physical examination, clinical judgement of the provider and imaging studies are consistent with end stage degenerative joint disease of the right hip(s) and total hip arthroplasty is deemed medically necessary. The treatment options including medical management, injection therapy, arthroscopy and arthroplasty were discussed at length. The risks and benefits of total hip arthroplasty were presented and reviewed. The risks due to aseptic loosening, infection, stiffness, dislocation/subluxation,  thromboembolic complications and other imponderables were discussed.  The patient acknowledged the explanation, agreed to proceed with the plan and consent was signed. Patient is being admitted for inpatient treatment for surgery, pain control, PT, OT, prophylactic antibiotics, VTE prophylaxis, progressive ambulation and ADL's and discharge planning.The patient is planning to be discharged home with home health services.       Erin Bass Erin Bass   PAC  12/19/2012, 9:22 PM

## 2012-12-19 NOTE — Anesthesia Preprocedure Evaluation (Addendum)
Anesthesia Evaluation  Patient identified by MRN, date of birth, ID band Patient awake    Reviewed: Allergy & Precautions, H&P , NPO status , Patient's Chart, lab work & pertinent test results  Airway Mallampati: II TM Distance: >3 FB Neck ROM: Full    Dental  (+) Teeth Intact and Dental Advisory Given   Pulmonary neg pulmonary ROS,    Pulmonary exam normal       Cardiovascular hypertension, Pt. on medications Rhythm:Regular     Neuro/Psych negative neurological ROS  negative psych ROS   GI/Hepatic negative GI ROS, Neg liver ROS,   Endo/Other  negative endocrine ROS  Renal/GU negative Renal ROS  negative genitourinary   Musculoskeletal  (+) Arthritis -,   Abdominal   Peds  Hematology negative hematology ROS (+) anemia ,   Anesthesia Other Findings Patient indicates her refusal of blood and all blood products.  Reproductive/Obstetrics negative OB ROS                          Anesthesia Physical Anesthesia Plan  ASA: II  Anesthesia Plan: General   Post-op Pain Management:    Induction: Intravenous  Airway Management Planned: Oral ETT  Additional Equipment:   Intra-op Plan:   Post-operative Plan: Extubation in OR  Informed Consent: I have reviewed the patients History and Physical, chart, labs and discussed the procedure including the risks, benefits and alternatives for the proposed anesthesia with the patient or authorized representative who has indicated his/her understanding and acceptance.   Dental advisory given  Plan Discussed with: CRNA  Anesthesia Plan Comments:         Anesthesia Quick Evaluation

## 2012-12-20 ENCOUNTER — Inpatient Hospital Stay (HOSPITAL_COMMUNITY): Payer: BC Managed Care – PPO | Admitting: Anesthesiology

## 2012-12-20 ENCOUNTER — Inpatient Hospital Stay (HOSPITAL_COMMUNITY): Payer: BC Managed Care – PPO

## 2012-12-20 ENCOUNTER — Inpatient Hospital Stay (HOSPITAL_COMMUNITY)
Admission: RE | Admit: 2012-12-20 | Discharge: 2012-12-21 | DRG: 470 | Disposition: A | Discharge status: Home Health | Payer: BC Managed Care – PPO | Source: Ambulatory Visit | Attending: Orthopedic Surgery | Admitting: Orthopedic Surgery

## 2012-12-20 ENCOUNTER — Encounter (HOSPITAL_COMMUNITY)
Admission: RE | Disposition: A | Discharge status: Home Health | Payer: Self-pay | Source: Ambulatory Visit | Attending: Orthopedic Surgery

## 2012-12-20 ENCOUNTER — Encounter (HOSPITAL_COMMUNITY): Payer: Self-pay | Admitting: *Deleted

## 2012-12-20 ENCOUNTER — Encounter (HOSPITAL_COMMUNITY): Payer: BC Managed Care – PPO | Admitting: Anesthesiology

## 2012-12-20 DIAGNOSIS — Z6827 Body mass index (BMI) 27.0-27.9, adult: Secondary | ICD-10-CM

## 2012-12-20 DIAGNOSIS — Z01812 Encounter for preprocedural laboratory examination: Secondary | ICD-10-CM

## 2012-12-20 DIAGNOSIS — E785 Hyperlipidemia, unspecified: Secondary | ICD-10-CM | POA: Diagnosis present

## 2012-12-20 DIAGNOSIS — Z780 Asymptomatic menopausal state: Secondary | ICD-10-CM

## 2012-12-20 DIAGNOSIS — E876 Hypokalemia: Secondary | ICD-10-CM | POA: Diagnosis not present

## 2012-12-20 DIAGNOSIS — D62 Acute posthemorrhagic anemia: Secondary | ICD-10-CM | POA: Diagnosis not present

## 2012-12-20 DIAGNOSIS — Z96649 Presence of unspecified artificial hip joint: Secondary | ICD-10-CM

## 2012-12-20 DIAGNOSIS — E663 Overweight: Secondary | ICD-10-CM

## 2012-12-20 DIAGNOSIS — M1610 Unilateral primary osteoarthritis, unspecified hip: Secondary | ICD-10-CM | POA: Diagnosis present

## 2012-12-20 DIAGNOSIS — M169 Osteoarthritis of hip, unspecified: Secondary | ICD-10-CM | POA: Diagnosis present

## 2012-12-20 DIAGNOSIS — M069 Rheumatoid arthritis, unspecified: Principal | ICD-10-CM | POA: Diagnosis present

## 2012-12-20 DIAGNOSIS — I10 Essential (primary) hypertension: Secondary | ICD-10-CM | POA: Diagnosis present

## 2012-12-20 HISTORY — PX: TOTAL HIP ARTHROPLASTY: SHX124

## 2012-12-20 SURGERY — ARTHROPLASTY, HIP, TOTAL, ANTERIOR APPROACH
Anesthesia: General | Site: Hip | Laterality: Right

## 2012-12-20 MED ORDER — CEFAZOLIN SODIUM-DEXTROSE 2-3 GM-% IV SOLR
2.0000 g | INTRAVENOUS | Status: AC
  Administered 2012-12-20: 2 g via INTRAVENOUS

## 2012-12-20 MED ORDER — PROMETHAZINE HCL 25 MG/ML IJ SOLN: 6.2500 mg | INTRAMUSCULAR | Status: DC | PRN

## 2012-12-20 MED ORDER — PHENOL 1.4 % MT LIQD: 1.0000 | OROMUCOSAL | Status: DC | PRN

## 2012-12-20 MED ORDER — MIDAZOLAM HCL 5 MG/5ML IJ SOLN
INTRAMUSCULAR | Status: DC | PRN
  Administered 2012-12-20: 2 mg via INTRAVENOUS

## 2012-12-20 MED ORDER — DEXAMETHASONE SODIUM PHOSPHATE 10 MG/ML IJ SOLN
10.0000 mg | Freq: Once | INTRAMUSCULAR | Status: AC
  Administered 2012-12-21: 10:00:00 10 mg via INTRAVENOUS
  Filled 2012-12-20: qty 1

## 2012-12-20 MED ORDER — MENTHOL 3 MG MT LOZG: 1.0000 | LOZENGE | OROMUCOSAL | Status: DC | PRN

## 2012-12-20 MED ORDER — HYDROMORPHONE HCL PF 1 MG/ML IJ SOLN
0.2000 mg | INTRAMUSCULAR | Status: DC | PRN
  Administered 2012-12-20: 0.5 mg via INTRAVENOUS
  Filled 2012-12-20: qty 1

## 2012-12-20 MED ORDER — DIPHENHYDRAMINE HCL 12.5 MG/5ML PO ELIX: 25.0000 mg | ORAL_SOLUTION | Freq: Four times a day (QID) | ORAL | Status: DC | PRN

## 2012-12-20 MED ORDER — HYDROMORPHONE HCL PF 1 MG/ML IJ SOLN
INTRAMUSCULAR | Status: AC
  Administered 2012-12-20: 0.5 mg via INTRAVENOUS
  Filled 2012-12-20: qty 1

## 2012-12-20 MED ORDER — METOCLOPRAMIDE HCL 5 MG/ML IJ SOLN
5.0000 mg | Freq: Three times a day (TID) | INTRAMUSCULAR | Status: DC | PRN
  Administered 2012-12-20 (×2): 10 mg via INTRAVENOUS
  Filled 2012-12-20 (×2): qty 2

## 2012-12-20 MED ORDER — HYDROMORPHONE HCL PF 2 MG/ML IJ SOLN
INTRAMUSCULAR | Status: AC
  Filled 2012-12-20: qty 1

## 2012-12-20 MED ORDER — TRANEXAMIC ACID 100 MG/ML IV SOLN
1000.0000 mg | Freq: Once | INTRAVENOUS | Status: AC
  Administered 2012-12-20: 1000 mg via INTRAVENOUS
  Filled 2012-12-20: qty 10

## 2012-12-20 MED ORDER — ZOLPIDEM TARTRATE 5 MG PO TABS: 5.0000 mg | ORAL_TABLET | Freq: Every evening | ORAL | Status: DC | PRN

## 2012-12-20 MED ORDER — ONDANSETRON HCL 4 MG/2ML IJ SOLN
INTRAMUSCULAR | Status: AC
  Filled 2012-12-20: qty 2

## 2012-12-20 MED ORDER — CEFAZOLIN SODIUM-DEXTROSE 2-3 GM-% IV SOLR
INTRAVENOUS | Status: AC
  Filled 2012-12-20: qty 50

## 2012-12-20 MED ORDER — HYDROMORPHONE HCL PF 1 MG/ML IJ SOLN
0.2500 mg | INTRAMUSCULAR | Status: DC | PRN
  Administered 2012-12-20 (×4): 0.5 mg via INTRAVENOUS

## 2012-12-20 MED ORDER — CISATRACURIUM BESYLATE (PF) 10 MG/5ML IV SOLN
INTRAVENOUS | Status: DC | PRN
  Administered 2012-12-20: 8 mg via INTRAVENOUS
  Administered 2012-12-20: 2 mg via INTRAVENOUS

## 2012-12-20 MED ORDER — HYDRALAZINE HCL 20 MG/ML IJ SOLN
INTRAMUSCULAR | Status: AC
  Filled 2012-12-20: qty 1

## 2012-12-20 MED ORDER — SENNA 8.6 MG PO TABS
1.0000 | ORAL_TABLET | Freq: Two times a day (BID) | ORAL | Status: DC
  Administered 2012-12-20 – 2012-12-21 (×2): 8.6 mg via ORAL
  Filled 2012-12-20 (×2): qty 1

## 2012-12-20 MED ORDER — 0.9 % SODIUM CHLORIDE (POUR BTL) OPTIME
TOPICAL | Status: DC | PRN
  Administered 2012-12-20: 1000 mL

## 2012-12-20 MED ORDER — LACTATED RINGERS IV SOLN
INTRAVENOUS | Status: DC | PRN
  Administered 2012-12-20 (×3): via INTRAVENOUS

## 2012-12-20 MED ORDER — CEFAZOLIN SODIUM 1-5 GM-% IV SOLN
1.0000 g | Freq: Four times a day (QID) | INTRAVENOUS | Status: AC
  Administered 2012-12-20 (×2): 1 g via INTRAVENOUS
  Filled 2012-12-20 (×2): qty 50

## 2012-12-20 MED ORDER — HYDROCODONE-ACETAMINOPHEN 7.5-325 MG PO TABS
1.0000 | ORAL_TABLET | ORAL | Status: DC
  Administered 2012-12-20: 15:00:00 1 via ORAL
  Administered 2012-12-20: 19:00:00 2 via ORAL
  Administered 2012-12-20 – 2012-12-21 (×6): 1 via ORAL
  Filled 2012-12-20: qty 2
  Filled 2012-12-20 (×7): qty 1

## 2012-12-20 MED ORDER — METHOCARBAMOL 100 MG/ML IJ SOLN
500.0000 mg | Freq: Four times a day (QID) | INTRAVENOUS | Status: DC | PRN
  Administered 2012-12-20: 500 mg via INTRAVENOUS
  Filled 2012-12-20: qty 5

## 2012-12-20 MED ORDER — ASPIRIN EC 325 MG PO TBEC
325.0000 mg | DELAYED_RELEASE_TABLET | Freq: Two times a day (BID) | ORAL | Status: DC
  Administered 2012-12-20 – 2012-12-21 (×2): 325 mg via ORAL
  Filled 2012-12-20 (×4): qty 1

## 2012-12-20 MED ORDER — NEOSTIGMINE METHYLSULFATE 1 MG/ML IJ SOLN
INTRAMUSCULAR | Status: DC | PRN
  Administered 2012-12-20: 4 mg via INTRAVENOUS

## 2012-12-20 MED ORDER — FERROUS SULFATE 325 (65 FE) MG PO TABS
325.0000 mg | ORAL_TABLET | Freq: Three times a day (TID) | ORAL | Status: DC
  Administered 2012-12-21 (×2): 325 mg via ORAL
  Filled 2012-12-20 (×5): qty 1

## 2012-12-20 MED ORDER — HYDROMORPHONE HCL PF 1 MG/ML IJ SOLN
INTRAMUSCULAR | Status: AC
  Filled 2012-12-20: qty 1

## 2012-12-20 MED ORDER — POLYETHYLENE GLYCOL 3350 17 G PO PACK: 17.0000 g | PACK | Freq: Every day | ORAL | Status: DC | PRN

## 2012-12-20 MED ORDER — CISATRACURIUM BESYLATE 20 MG/10ML IV SOLN
INTRAVENOUS | Status: AC
  Filled 2012-12-20: qty 10

## 2012-12-20 MED ORDER — PROPOFOL 10 MG/ML IV BOLUS
INTRAVENOUS | Status: AC
  Filled 2012-12-20: qty 20

## 2012-12-20 MED ORDER — DEXAMETHASONE SODIUM PHOSPHATE 10 MG/ML IJ SOLN
INTRAMUSCULAR | Status: DC | PRN
  Administered 2012-12-20: 10 mg via INTRAVENOUS

## 2012-12-20 MED ORDER — FENTANYL CITRATE 0.05 MG/ML IJ SOLN
INTRAMUSCULAR | Status: DC | PRN
  Administered 2012-12-20: 100 ug via INTRAVENOUS
  Administered 2012-12-20 (×2): 50 ug via INTRAVENOUS

## 2012-12-20 MED ORDER — DEXAMETHASONE SODIUM PHOSPHATE 10 MG/ML IJ SOLN
INTRAMUSCULAR | Status: AC
  Filled 2012-12-20: qty 1

## 2012-12-20 MED ORDER — FENTANYL CITRATE 0.05 MG/ML IJ SOLN
INTRAMUSCULAR | Status: AC
  Filled 2012-12-20: qty 5

## 2012-12-20 MED ORDER — GLYCOPYRROLATE 0.2 MG/ML IJ SOLN
INTRAMUSCULAR | Status: DC | PRN
  Administered 2012-12-20: 0.6 mg via INTRAVENOUS

## 2012-12-20 MED ORDER — ALUM & MAG HYDROXIDE-SIMETH 200-200-20 MG/5ML PO SUSP: 30.0000 mL | ORAL | Status: DC | PRN

## 2012-12-20 MED ORDER — PHENYLEPHRINE HCL 10 MG/ML IJ SOLN
INTRAMUSCULAR | Status: DC | PRN
  Administered 2012-12-20 (×2): 80 ug via INTRAVENOUS

## 2012-12-20 MED ORDER — DEXAMETHASONE SODIUM PHOSPHATE 10 MG/ML IJ SOLN: 10.0000 mg | Freq: Once | INTRAMUSCULAR | Status: DC

## 2012-12-20 MED ORDER — SODIUM CHLORIDE 0.9 % IJ SOLN
INTRAMUSCULAR | Status: AC
  Filled 2012-12-20: qty 10

## 2012-12-20 MED ORDER — ONDANSETRON HCL 4 MG PO TABS: 4.0000 mg | ORAL_TABLET | Freq: Four times a day (QID) | ORAL | Status: DC | PRN

## 2012-12-20 MED ORDER — ONDANSETRON HCL 4 MG/2ML IJ SOLN
4.0000 mg | Freq: Four times a day (QID) | INTRAMUSCULAR | Status: DC | PRN
  Administered 2012-12-20: 18:00:00 4 mg via INTRAVENOUS
  Filled 2012-12-20: qty 2

## 2012-12-20 MED ORDER — HYDRALAZINE HCL 20 MG/ML IJ SOLN
5.0000 mg | Freq: Once | INTRAMUSCULAR | Status: AC
  Administered 2012-12-20: 5 mg via INTRAVENOUS

## 2012-12-20 MED ORDER — ONDANSETRON HCL 4 MG/2ML IJ SOLN
INTRAMUSCULAR | Status: DC | PRN
  Administered 2012-12-20: 4 mg via INTRAVENOUS

## 2012-12-20 MED ORDER — DOCUSATE SODIUM 100 MG PO CAPS
100.0000 mg | ORAL_CAPSULE | Freq: Two times a day (BID) | ORAL | Status: DC
  Administered 2012-12-20 – 2012-12-21 (×2): 100 mg via ORAL

## 2012-12-20 MED ORDER — PROPOFOL 10 MG/ML IV BOLUS
INTRAVENOUS | Status: DC | PRN
  Administered 2012-12-20: 150 mg via INTRAVENOUS

## 2012-12-20 MED ORDER — LACTATED RINGERS IV SOLN: INTRAVENOUS | Status: DC

## 2012-12-20 MED ORDER — SODIUM CHLORIDE 0.9 % IV SOLN
INTRAVENOUS | Status: DC
  Administered 2012-12-20 (×2): via INTRAVENOUS
  Filled 2012-12-20 (×4): qty 1000

## 2012-12-20 MED ORDER — MIDAZOLAM HCL 2 MG/2ML IJ SOLN
INTRAMUSCULAR | Status: AC
  Filled 2012-12-20: qty 2

## 2012-12-20 MED ORDER — METHOCARBAMOL 500 MG PO TABS
500.0000 mg | ORAL_TABLET | Freq: Four times a day (QID) | ORAL | Status: DC | PRN
  Administered 2012-12-21: 500 mg via ORAL
  Filled 2012-12-20: qty 1

## 2012-12-20 MED ORDER — PHENYLEPHRINE 40 MCG/ML (10ML) SYRINGE FOR IV PUSH (FOR BLOOD PRESSURE SUPPORT)
PREFILLED_SYRINGE | INTRAVENOUS | Status: AC
  Filled 2012-12-20: qty 10

## 2012-12-20 MED ORDER — HYDROMORPHONE HCL PF 1 MG/ML IJ SOLN
INTRAMUSCULAR | Status: DC | PRN
  Administered 2012-12-20: 1 mg via INTRAVENOUS

## 2012-12-20 SURGICAL SUPPLY — 43 items
ADH SKN CLS APL DERMABOND .7 (GAUZE/BANDAGES/DRESSINGS) ×1
BAG SPEC THK2 15X12 ZIP CLS (MISCELLANEOUS)
BAG ZIPLOCK 12X15 (MISCELLANEOUS) IMPLANT
BLADE SAW SGTL 18X1.27X75 (BLADE) ×2 IMPLANT
CAPT HIP PF COP ×1 IMPLANT
DERMABOND ADVANCED (GAUZE/BANDAGES/DRESSINGS) ×1
DERMABOND ADVANCED .7 DNX12 (GAUZE/BANDAGES/DRESSINGS) ×1 IMPLANT
DRAPE C-ARM 42X120 X-RAY (DRAPES) ×2 IMPLANT
DRAPE STERI IOBAN 125X83 (DRAPES) ×2 IMPLANT
DRAPE U-SHAPE 47X51 STRL (DRAPES) ×6 IMPLANT
DRSG AQUACEL AG ADV 3.5X10 (GAUZE/BANDAGES/DRESSINGS) ×2 IMPLANT
DRSG TEGADERM 4X4.75 (GAUZE/BANDAGES/DRESSINGS) IMPLANT
DURAPREP 26ML APPLICATOR (WOUND CARE) ×2 IMPLANT
ELECT BLADE TIP CTD 4 INCH (ELECTRODE) ×2 IMPLANT
ELECT REM PT RETURN 9FT ADLT (ELECTROSURGICAL) ×2
ELECTRODE REM PT RTRN 9FT ADLT (ELECTROSURGICAL) ×1 IMPLANT
EVACUATOR 1/8 PVC DRAIN (DRAIN) IMPLANT
FACESHIELD LNG OPTICON STERILE (SAFETY) ×8 IMPLANT
GAUZE SPONGE 2X2 8PLY STRL LF (GAUZE/BANDAGES/DRESSINGS) IMPLANT
GLOVE BIO SURGEON STRL SZ8 (GLOVE) ×1 IMPLANT
GLOVE BIOGEL PI IND STRL 7.5 (GLOVE) ×1 IMPLANT
GLOVE BIOGEL PI IND STRL 8 (GLOVE) ×1 IMPLANT
GLOVE BIOGEL PI IND STRL 8.5 (GLOVE) IMPLANT
GLOVE BIOGEL PI INDICATOR 7.5 (GLOVE) ×3
GLOVE BIOGEL PI INDICATOR 8 (GLOVE) ×1
GLOVE BIOGEL PI INDICATOR 8.5 (GLOVE) ×1
GLOVE ECLIPSE 8.0 STRL XLNG CF (GLOVE) ×2 IMPLANT
GLOVE ORTHO TXT STRL SZ7.5 (GLOVE) ×4 IMPLANT
GOWN BRE IMP PREV XXLGXLNG (GOWN DISPOSABLE) ×2 IMPLANT
GOWN PREVENTION PLUS LG XLONG (DISPOSABLE) ×2 IMPLANT
GOWN STRL REIN 2XL XLG LVL4 (GOWN DISPOSABLE) ×1 IMPLANT
GOWN STRL REIN XL XLG (GOWN DISPOSABLE) ×1 IMPLANT
KIT BASIN OR (CUSTOM PROCEDURE TRAY) ×2 IMPLANT
PACK TOTAL JOINT (CUSTOM PROCEDURE TRAY) ×2 IMPLANT
PADDING CAST COTTON 6X4 STRL (CAST SUPPLIES) ×2 IMPLANT
SPONGE GAUZE 2X2 STER 10/PKG (GAUZE/BANDAGES/DRESSINGS)
SUT MNCRL AB 4-0 PS2 18 (SUTURE) ×2 IMPLANT
SUT VIC AB 1 CT1 36 (SUTURE) ×6 IMPLANT
SUT VIC AB 2-0 CT1 27 (SUTURE) ×4
SUT VIC AB 2-0 CT1 TAPERPNT 27 (SUTURE) ×2 IMPLANT
SUT VLOC 180 0 24IN GS25 (SUTURE) ×2 IMPLANT
TOWEL OR 17X26 10 PK STRL BLUE (TOWEL DISPOSABLE) ×2 IMPLANT
TRAY FOLEY CATH 14FRSI W/METER (CATHETERS) ×2 IMPLANT

## 2012-12-20 NOTE — Transfer of Care (Signed)
Immediate Anesthesia Transfer of Care Note  Patient: Erin Bass  Procedure(s) Performed: Procedure(s): RIGHT TOTAL HIP ARTHROPLASTY ANTERIOR APPROACH (Right)  Patient Location: PACU  Anesthesia Type:General  Level of Consciousness: sedated  Airway & Oxygen Therapy: Patient Spontanous Breathing and Patient connected to face mask oxygen  Post-op Assessment: Report given to PACU RN and Post -op Vital signs reviewed and stable  Post vital signs: stable  Complications: No apparent anesthesia complications

## 2012-12-20 NOTE — Anesthesia Postprocedure Evaluation (Signed)
Anesthesia Post Note  Patient: Erin Bass  Procedure(s) Performed: Procedure(s) (LRB): RIGHT TOTAL HIP ARTHROPLASTY ANTERIOR APPROACH (Right)  Anesthesia type: General  Patient location: PACU  Post pain: Pain level controlled  Post assessment: Post-op Vital signs reviewed  Last Vitals:  Filed Vitals:   12/20/12 1340  BP: 96/62  Pulse: 83  Temp: 36.5 C  Resp: 16    Post vital signs: Reviewed  Level of consciousness: sedated  Complications: No apparent anesthesia complications

## 2012-12-20 NOTE — Progress Notes (Signed)
Blood pressure now 124/82

## 2012-12-20 NOTE — Progress Notes (Signed)
Dr. Rica Mast made aware of patient's continual elevated blood pressures- Apresoline 5 mg IVP given as ordered.

## 2012-12-20 NOTE — Progress Notes (Signed)
Dr. Rica Mast made aware of patient's elevated blood pressure.

## 2012-12-20 NOTE — Interval H&P Note (Signed)
History and Physical Interval Note:  12/20/2012 6:51 AM  Erin Bass  has presented today for surgery, with the diagnosis of RIGHT HIP RHEUMATOID ARTHRITIS  The various methods of treatment have been discussed with the patient and family. After consideration of risks, benefits and other options for treatment, the patient has consented to  Procedure(s): RIGHT TOTAL HIP ARTHROPLASTY ANTERIOR APPROACH (Right) as a surgical intervention .  The patient's history has been reviewed, patient examined, no change in status, stable for surgery.  I have reviewed the patient's chart and labs.  Questions were answered to the patient's satisfaction.     Shelda Pal

## 2012-12-20 NOTE — Evaluation (Signed)
Physical Therapy Evaluation Patient Details Name: Erin Bass MRN: [id number] DOB: 1960-03-10 Today's Date: 12/20/2012 Time: 4098-1191 PT Time Calculation (min): 24 min  PT Assessment / Plan / Recommendation History of Present Illness  Erin Bass  Clinical Impression  Pt vomited upon sitting up and felt better, No problems with BP. Pt aMB X 80'. PLANS DC tomorrow.    PT Assessment  Patient needs continued PT services    Follow Up Recommendations  Home health PT    Does the patient have the potential to tolerate intense rehabilitation      Barriers to Discharge        Equipment Recommendations  Rolling walker with 5" wheels    Recommendations for Other Services     Frequency 7X/week    Precautions / Restrictions Precautions Precautions: Fall Restrictions Weight Bearing Restrictions: No   Pertinent Vitals/Pain Only sore thigh      Mobility  Bed Mobility Bed Mobility: Supine to Sit;Sit to Supine Supine to Sit: 4: Min assist;HOB elevated;With rails Sit to Supine: 4: Min assist;With rail Details for Bed Mobility Assistance: asssit for RLE over edge and onto bed. Transfers Transfers: Sit to Stand;Stand to Sit Sit to Stand: 4: Min assist;From bed Stand to Sit: 4: Min assist;To bed Details for Transfer Assistance: cues for hand placement., R leg position Ambulation/Gait Ambulation/Gait Assistance: 4: Min assist Ambulation Distance (Feet): 80 Feet Assistive device: Rolling walker Ambulation/Gait Assistance Details: cues for sequence and posture. Gait Pattern: Step-to pattern;Step-through pattern;Antalgic    Exercises     PT Diagnosis: Difficulty walking;Acute pain  PT Problem List: Decreased strength;Decreased range of motion;Decreased activity tolerance;Decreased mobility;Decreased safety awareness;Decreased knowledge of use of DME;Pain PT Treatment Interventions: DME instruction;Gait training;Stair training;Functional mobility training;Therapeutic  activities;Therapeutic exercise;Patient/family education     PT Goals(Current goals can be found in the care plan section) Acute Rehab PT Goals Patient Stated Goal: to walk with out pain. PT Goal Formulation: With patient Time For Goal Achievement: 12/22/12 Potential to Achieve Goals: Good  Visit Information  Last PT Received On: 12/20/12 Assistance Needed: +1 History of Present Illness: Erin Bass       Prior Functioning  Home Living Family/patient expects to be discharged to:: Private residence Living Arrangements: Spouse/significant other Available Help at Discharge: Family Type of Home: Apartment Home Access: Stairs to enter Secretary/administrator of Steps: 16 Entrance Stairs-Rails: Right;Left Home Layout: One level Home Equipment: Cane - single point Prior Function Level of Independence: Independent with assistive device(s) Communication Communication: No difficulties    Cognition  Cognition Arousal/Alertness: Awake/alert Behavior During Therapy: WFL for tasks assessed/performed Overall Cognitive Status: Within Functional Limits for tasks assessed    Extremity/Trunk Assessment Upper Extremity Assessment Upper Extremity Assessment: Defer to OT evaluation Lower Extremity Assessment Lower Extremity Assessment: RLE deficits/detail RLE Deficits / Details: initially decreased ability to advance RLE, improved with practice.   Balance    End of Session PT - End of Session Activity Tolerance: Patient tolerated treatment well Patient left: in bed;with call bell/phone within reach Nurse Communication: Mobility status  GP     Erin Bass 12/20/2012, 5:39 PM

## 2012-12-20 NOTE — Op Note (Signed)
NAME:  Erin Bass                ACCOUNT NO.: [id number]      MEDICAL RECORD NO.: [id number]      FACILITY:  [facility]      PHYSICIAN:  Durene Romans D  DATE OF BIRTH:  1960/09/20     DATE OF PROCEDURE:  12/20/2012                                 OPERATIVE REPORT         PREOPERATIVE DIAGNOSIS: Right  hip rheumatoid arthritis      POSTOPERATIVE DIAGNOSIS:  Right hip rheumatoid arthritis     PROCEDURE:  Right total hip replacement through an anterior approach   utilizing DePuy THR system, component size 50mm pinnacle cup, a size 32+4 neutral   Altrex liner, a size 1 Hi Tri Lock stem with a 32+1 delta ceramic   ball.      SURGEON:  Madlyn Frankel. Charlann Boxer, M.D.      ASSISTANT:  Leilani Able, PA-C     ANESTHESIA:  General.      SPECIMENS:  None.      COMPLICATIONS:  None.      BLOOD LOSS:  550 cc     DRAINS:  None.      INDICATION OF THE PROCEDURE:  [name] is a 52 y.o. female who had   presented to office for evaluation of right hip pain related to underlying rheumatoid arthritis.  Radiographs revealed   progressive and significant degenerative changes with bone-on-bone   articulation to the  hip joint.  The patient had painful limited range of   motion significantly affecting their overall quality of life.  The patient was failing to    respond to conservative measures, and at this point was ready   to proceed with more definitive measures.  The patient has noted progressive   degenerative changes in his hip, progressive problems and dysfunction   with regarding the hip prior to surgery.  Consent was obtained for   benefit of pain relief.  Specific risk of infection, DVT, component   failure, dislocation, need for revision surgery, as well discussion of   the anterior versus posterior approach were reviewed.  Consent was   obtained for benefit of anterior pain relief through an anterior   approach.      PROCEDURE IN DETAIL:  The patient was  brought to operative theater.   Once adequate anesthesia, preoperative antibiotics, 2gm Ancef administered.   The patient was positioned supine on the OSI Hanna table.  Once adequate   padding of boney process was carried out, we had predraped out the hip, and  used fluoroscopy to confirm orientation of the pelvis and position.      The right hip was then prepped and draped from proximal iliac crest to   mid thigh with shower curtain technique.      Time-out was performed identifying the patient, planned procedure, and   extremity.     An incision was then made 2 cm distal and lateral to the   anterior superior iliac spine extending over the orientation of the   tensor fascia lata muscle and sharp dissection was carried down to the   fascia of the muscle and protractor placed in the soft tissues.      The fascia was then incised.  The muscle belly was identified and swept   laterally and retractor placed along the superior neck.  Following   cauterization of the circumflex vessels and removing some pericapsular   fat, a second cobra retractor was placed on the inferior neck.  A third   retractor was placed on the anterior acetabulum after elevating the   anterior rectus.  I performed a capsulectomy on her her due to the underlying rheumatoid and the general appearance of her synovial tissue.  We then identified the trochanteric fossa and   orientation of my neck cut, confirmed this radiographically   and then made a neck osteotomy with the femur on traction.  The femoral   head was removed without difficulty or complication.  Traction was let   off and retractors were placed posterior and anterior around the   acetabulum.      The labrum and foveal tissue were debrided.  I began reaming with a 45mm   reamer and reamed up to 49mm reamer with good bony bed preparation and a 50   cup was chosen.  The final 50mm Pinnacle cup was then impacted under fluoroscopy  to confirm the depth of  penetration and orientation with respect to   abduction.  A screw was placed followed by the hole eliminator.  The final   32+4 neutral Altrex liner was impacted with good visualized rim fit.  The cup was positioned anatomically within the acetabular portion of the pelvis.      At this point, the femur was rolled at 80 degrees.  Further capsule was   released off the inferior aspect of the femoral neck.  I then   released the superior capsule proximally.  The hook was placed laterally   along the femur and elevated manually and held in position with the bed   hook.  The leg was then extended and adducted with the leg rolled to 100   degrees of external rotation.  Once the proximal femur was fully   exposed, I used a box osteotome to set orientation.  I then began   broaching with the starting chili pepper broach and passed this by hand and then broached up to 1.  With the 1 broach in place I chose a high offset neck and did a trial reduction.  The offset was appropriate, leg lengths   appeared to be equal, confirmed radiographically.   Given these findings, I went ahead and dislocated the hip, repositioned all   retractors and positioned the right hip in the extended and abducted position.  The final 1 high offset Tri Lock stem was   chosen and it was impacted down to the level of neck cut.  Based on this   and the trial reduction, a 32+1 delta ceramic ball was chosen and   impacted onto a clean and dry trunnion, and the hip was reduced.  The   hip had been irrigated throughout the case again at this point.  The fascia of the   tensor fascia lata muscle was then reapproximated using #1 Vicryl and #0 V-lock sutures.  The   remaining wound was closed with 2-0 Vicryl and running 4-0 Monocryl.   The hip was cleaned, dried, and dressed sterilely using Dermabond and   Aquacel dressing. She was then brought   to recovery room in stable condition tolerating the procedure well.    Leilani Able, PA-C  was present for the entirety of the case involved from   preoperative positioning,  perioperative retractor management, general   facilitation of the case, as well as primary wound closure as assistant.            Madlyn Frankel Charlann Boxer, M.D.        12/20/2012 8:41 AM

## 2012-12-20 NOTE — Progress Notes (Signed)
X-ray results noted 

## 2012-12-20 NOTE — Care Management Note (Addendum)
    Page 1 of 1   12/21/2012     6:32:20 PM   CARE MANAGEMENT NOTE 12/21/2012  Patient:  Erin Bass, Erin Bass   Account Number:  [id number]  Date Initiated:  12/20/2012  Documentation initiated by:  Colleen Can  Subjective/Objective Assessment:   dx total hip replacemnt-anterior approach     Action/Plan:   Fresh post op. pt/opt pending; CM will follow   Anticipated DC Date:  12/22/2012   Anticipated DC Plan:  HOME W HOME HEALTH SERVICES      DC Planning Services  CM consult      [facility] Choice  HOME HEALTH   Choice offered to / List presented to:  C-1 Patient        HH arranged  HH-2 PT      [facility] agency  [facility]   Status of service:  Completed, signed off Medicare Important Message given?   (If response is "NO", the following Medicare IM given date fields will be blank) Date Medicare IM given:   Date Additional Medicare IM given:    Discharge Disposition:  HOME W HOME HEALTH SERVICES  Per UR Regulation:  Reviewed for med. necessity/level of care/duration of stay  If discussed at Long Length of Stay Meetings, dates discussed:    Comments:  12/21/2012 Colleen Can BSN RN CCM [phone] CM spoke with patient. Plans are for her to return to her home in [place] where daughter will be caregiver. States her daughter is a CNA, Pt already has DME. States Erin Bass is [facility] agency of choice. They will start services tomorrow.

## 2012-12-20 NOTE — Progress Notes (Signed)
Portable AP Pelvis and Lateral Right Hip X-rays done. 

## 2012-12-21 ENCOUNTER — Encounter (HOSPITAL_COMMUNITY): Payer: Self-pay | Admitting: Orthopedic Surgery

## 2012-12-21 DIAGNOSIS — E663 Overweight: Secondary | ICD-10-CM

## 2012-12-21 DIAGNOSIS — E876 Hypokalemia: Secondary | ICD-10-CM | POA: Diagnosis not present

## 2012-12-21 LAB — CBC
MCH: 23.9 pg — ABNORMAL LOW (ref 26.0–34.0)
MCHC: 33.5 g/dL (ref 30.0–36.0)
RDW: 13.2 % (ref 11.5–15.5)

## 2012-12-21 LAB — BASIC METABOLIC PANEL
CO2: 25 mEq/L (ref 19–32)
Calcium: 8.6 mg/dL (ref 8.4–10.5)
Creatinine, Ser: 0.72 mg/dL (ref 0.50–1.10)
GFR calc Af Amer: >90 mL/min (ref >90)
GFR calc non Af Amer: >90 mL/min (ref >90)
Potassium: 3.3 mEq/L — ABNORMAL LOW (ref 3.5–5.1)
Sodium: 138 mEq/L (ref 135–145)

## 2012-12-21 MED ORDER — POTASSIUM CHLORIDE CRYS ER 20 MEQ PO TBCR
20.0000 meq | EXTENDED_RELEASE_TABLET | Freq: Two times a day (BID) | ORAL | Status: DC
  Administered 2012-12-21: 20 meq via ORAL
  Filled 2012-12-21 (×3): qty 1

## 2012-12-21 MED ORDER — POLYETHYLENE GLYCOL 3350 17 G PO PACK: 17.0000 g | PACK | Freq: Every day | ORAL | Status: DC | PRN

## 2012-12-21 MED ORDER — POTASSIUM CHLORIDE CRYS ER 20 MEQ PO TBCR: 20.0000 meq | EXTENDED_RELEASE_TABLET | Freq: Two times a day (BID) | ORAL | Status: DC

## 2012-12-21 MED ORDER — FERROUS SULFATE 325 (65 FE) MG PO TABS: 325.0000 mg | ORAL_TABLET | Freq: Three times a day (TID) | ORAL | Status: DC

## 2012-12-21 MED ORDER — METHOCARBAMOL 500 MG PO TABS: 500.0000 mg | ORAL_TABLET | Freq: Four times a day (QID) | ORAL | Status: DC | PRN

## 2012-12-21 MED ORDER — ASPIRIN 325 MG PO TBEC: 325.0000 mg | DELAYED_RELEASE_TABLET | Freq: Two times a day (BID) | ORAL | Status: AC

## 2012-12-21 MED ORDER — DSS 100 MG PO CAPS: 100.0000 mg | ORAL_CAPSULE | Freq: Two times a day (BID) | ORAL | Status: DC

## 2012-12-21 MED ORDER — HYDROCODONE-ACETAMINOPHEN 7.5-325 MG PO TABS: 1.0000 | ORAL_TABLET | ORAL | Status: DC | PRN

## 2012-12-21 NOTE — Progress Notes (Signed)
Physical Therapy Treatment Patient Details Name: Erin Bass MRN: [id number] DOB: [birth year] Today's Date: 12/21/2012 Time: 1478-2956 PT Time Calculation (min): 23 min  PT Assessment / Plan / Recommendation  History of Present Illness RDATHA   PT Comments   Ready for DC/  Follow Up Recommendations  Home health PT     Does the patient have the potential to tolerate intense rehabilitation     Barriers to Discharge        Equipment Recommendations  None recommended by PT    Recommendations for Other Services    Frequency 7X/week   Progress towards PT Goals Progress towards PT goals: Progressing toward goals  Plan Current plan remains appropriate    Precautions / Restrictions Precautions Precautions: Fall   Pertinent Vitals/Pain No pain    Mobility  Bed Mobility Supine to Sit: Not tested (comment) Sit to Supine: Not Tested (comment) Details for Bed Mobility Assistance: cues to use her cane to hook R foot to get leg onto/off bed. Transfers Sit to Stand: 5: Supervision;From chair/3-in-1;With upper extremity assist Stand to Sit: 4: Min assist;5: Supervision;To bed Details for Transfer Assistance: cues for R LE for comfort Ambulation/Gait Ambulation/Gait Assistance: 5: Supervision Ambulation Distance (Feet): 80 Feet Assistive device: Rolling walker Ambulation/Gait Assistance Details: cues for posture, sequence, correct height of RW Gait Pattern: Step-to pattern;Step-through pattern;Antalgic Stairs: Yes Stairs Assistance: 4: Min guard Stairs Assistance Details (indicate cue type and reason): cues for seqence, safety Stair Management Technique: Forwards;One rail Left;With cane Number of Stairs: 2    Exercises Total Joint Exercises Ankle Circles/Pumps: AROM;Both;10 reps;Supine Quad Sets: AROM;Right;10 reps;Supine Short Arc Quad: AROM;10 reps;Supine;Right Heel Slides: AROM;Right;10 reps Hip ABduction/ADduction: AAROM;Right;10 reps Long Arc Quad: AROM;Right;10 reps    PT Diagnosis:    PT Problem List:   PT Treatment Interventions:     PT Goals (current goals can now be found in the care plan section)    Visit Information  Last PT Received On: 12/21/12 Assistance Needed: +1 History of Present Illness: RDATHA    Subjective Data      Cognition  Cognition Arousal/Alertness: Awake/alert Behavior During Therapy: Impulsive    Balance     End of Session PT - End of Session Activity Tolerance: Patient tolerated treatment well Patient left: in chair;with call bell/phone within reach Nurse Communication: Mobility status   GP     [name] 12/21/2012, 2:20 PM

## 2012-12-21 NOTE — Evaluation (Signed)
Occupational Therapy Evaluation Patient Details Name: Erin Bass MRN: [id number] DOB: Jul 31, 1960 Today's Date: 12/21/2012 Time: 4098-1191 OT Time Calculation (min): 16 min  OT Assessment / Plan / Recommendation History of present illness Erin Bass   Clinical Impression   Pt was admitted for the above surgery.  All education was completed.  No further OT is needed at this time.     OT Assessment  Patient does not need any further OT services    Follow Up Recommendations  No OT follow up    Barriers to Discharge      Equipment Recommendations  None recommended by OT  She has borrowed a 3:1  Recommendations for Other Services    Frequency       Precautions / Restrictions Precautions Precautions: Fall Restrictions Weight Bearing Restrictions: No   Pertinent Vitals/Pain Sore RLE--repositioned with ice    ADL  Grooming: Supervision/safety Where Assessed - Grooming: Supported standing Upper Body Bathing: Set up Where Assessed - Upper Body Bathing: Unsupported sitting Lower Body Bathing: Minimal assistance Where Assessed - Lower Body Bathing: Supported sit to stand Upper Body Dressing: Set up Where Assessed - Upper Body Dressing: Unsupported sitting Lower Body Dressing: Minimal assistance Where Assessed - Lower Body Dressing: Supported sit to Pharmacist, hospital: Supervision/safety Statistician Method: Sit to Barista: Raised toilet seat with arms (or 3-in-1 over toilet) Toileting - Clothing Manipulation and Hygiene: Supervision/safety Where Assessed - Engineer, mining and Hygiene: Sit on 3-in-1 or toilet Equipment Used: Rolling walker Transfers/Ambulation Related to ADLs: supervision walking from bathroom ADL Comments: introduced ae:  pt will have daughter help her.  She tried sock aid.  She can reach to start pants without ae    OT Diagnosis:    OT Problem List:   OT Treatment Interventions:     OT Goals(Current goals can  be found in the care plan section)    Visit Information  Last OT Received On: 12/21/12 Assistance Needed: +1 History of Present Illness: Erin Bass       Prior Functioning     Home Living Family/patient expects to be discharged to:: Private residence Living Arrangements: Spouse/significant other Home Equipment: Bedside commode (borrowed) Prior Function Level of Independence: Independent with assistive device(s) Comments: daughter is cna and will stay/help as needed Communication Communication: No difficulties         Vision/Perception     Cognition  Cognition Arousal/Alertness: Awake/alert Behavior During Therapy: WFL for tasks assessed/performed Overall Cognitive Status: Within Functional Limits for tasks assessed    Extremity/Trunk Assessment Upper Extremity Assessment Upper Extremity Assessment: Overall WFL for tasks assessed     Mobility Transfers Sit to Stand: 5: Supervision;From chair/3-in-1;With upper extremity assist Details for Transfer Assistance: cues for R LE for comfort     Exercise     Balance     End of Session OT - End of Session Activity Tolerance: Patient tolerated treatment well Patient left: in chair;with call bell/phone within reach  GO     Erin Bass 12/21/2012, 11:01 AM Erin Bass, OTR/L [phone] 12/21/2012

## 2012-12-21 NOTE — Progress Notes (Signed)
[facility]  [facility] is providing the following services: patient declined rw and commode.  Has both at home.   If patient discharges after hours, please call [phone].   Renard Hamper 12/21/2012, 8:55 AM

## 2012-12-21 NOTE — Progress Notes (Signed)
Physical Therapy Treatment Patient Details Name: Erin Bass MRN: [id number] DOB: 07/06/1960 Today's Date: 12/21/2012 Time: 7829-5621 PT Time Calculation (min): 36 min  PT Assessment / Plan / Recommendation  History of Present Illness RDATHA   PT Comments   Pt improving in mobility. To DC after next PT  Follow Up Recommendations  Home health PT     Does the patient have the potential to tolerate intense rehabilitation     Barriers to Discharge        Equipment Recommendations  None recommended by PT    Recommendations for Other Services    Frequency 7X/week   Progress towards PT Goals Progress towards PT goals: Progressing toward goals  Plan Current plan remains appropriate    Precautions / Restrictions Precautions Precautions: Fall   Pertinent Vitals/Pain Only sore thigh    Mobility  Bed Mobility Supine to Sit: 4: Min guard Sit to Supine: 4: Min guard Details for Bed Mobility Assistance: cues to use her cane to hook R foot to get leg onto/off bed. Transfers Sit to Stand: 5: Supervision;From chair/3-in-1;With upper extremity assist Stand to Sit: 4: Min assist;5: Supervision;To bed Details for Transfer Assistance: cues for R LE for comfort Ambulation/Gait Ambulation/Gait Assistance: 5: Supervision Ambulation Distance (Feet): 80 Feet Assistive device: Rolling walker Ambulation/Gait Assistance Details: cues for posture, sequence, correct height of RW Gait Pattern: Step-to pattern;Step-through pattern;Antalgic Stairs: Yes Stairs Assistance: 4: Min assist Stairs Assistance Details (indicate cue type and reason): cues for seqence, safety Stair Management Technique: Forwards;One rail Left;With cane Number of Stairs: 2    Exercises Total Joint Exercises Ankle Circles/Pumps: AROM;Both;10 reps;Supine Quad Sets: AROM;Right;10 reps;Supine Short Arc Quad: AROM;10 reps;Supine;Right Heel Slides: AROM;Right;10 reps Hip ABduction/ADduction: AAROM;Right;10 reps Long Arc  Quad: AROM;Right;10 reps   PT Diagnosis:    PT Problem List:   PT Treatment Interventions:     PT Goals (current goals can now be found in the care plan section)    Visit Information  Last PT Received On: 12/21/12 Assistance Needed: +1 History of Present Illness: RDATHA    Subjective Data      Cognition  Cognition Arousal/Alertness: Awake/alert Behavior During Therapy: Impulsive    Balance     End of Session PT - End of Session Activity Tolerance: Patient tolerated treatment well Patient left: in bed;with call bell/phone within reach Nurse Communication: Mobility status   GP     [name] 12/21/2012, 2:17 PM

## 2012-12-21 NOTE — Progress Notes (Signed)
   Subjective: 1 Day Post-Op Procedure(s) (LRB): RIGHT TOTAL HIP ARTHROPLASTY ANTERIOR APPROACH (Right)   Patient reports pain as mild, pain well controlled. No events throughout the night. Ready to be discharged home, if she does well with PT and pain stays well controlled.   Objective:   VITALS:   Filed Vitals:   12/21/12 0651  BP: 137/81  Pulse: 75  Temp: 97.4 F (36.3 C)  Resp: 16    Neurovascular intact Dorsiflexion/Plantar flexion intact Incision: dressing C/D/I No cellulitis present Compartment soft  LABS  Recent Labs  12/21/12 0505  HGB 8.8*  HCT 26.3*  WBC 10.9*  PLT 199     Recent Labs  12/21/12 0505  NA 138  K 3.3*  BUN 9  CREATININE 0.72  GLUCOSE 94     Assessment/Plan: 1 Day Post-Op Procedure(s) (LRB): RIGHT TOTAL HIP ARTHROPLASTY ANTERIOR APPROACH (Right) Foley cath d/c'ed Advance diet Up with therapy D/C IV fluids Discharge home with home health Follow up in 2 weeks at [facility]. Follow up with OLIN,Ilsa Bonello D in 2 weeks.  Contact information:  [facility] [address] [place] [address] [phone]    Expected ABLA  Treated with iron and will observe  Overweight (BMI 25-29.9) Estimated body mass index is 27.61 kg/(m^2) as calculated from the following:   Height as of this encounter: 5\' 2"  (1.575 m).   Weight as of this encounter: 68.493 kg (151 lb). Patient also counseled that weight may inhibit the healing process Patient counseled that losing weight will help with future health issues  Hypokalemia Treated with oral potassium and will observe       Anastasio Auerbach. Chilton Sallade   PAC  12/21/2012, 8:21 AM

## 2012-12-27 NOTE — Discharge Summary (Signed)
Mistyped pend versus sign

## 2012-12-27 NOTE — Discharge Summary (Signed)
Physician Discharge Summary  [name] MRN: [id number] DOB/AGE: Mar 04, 1960 52 y.o.  Admit date: 12/20/2012 Discharge date: 12/21/2012   Procedures:  Procedure(s) (LRB): RIGHT TOTAL HIP ARTHROPLASTY ANTERIOR APPROACH (Right)  Attending Physician:  Dr. Durene Romans   Admission Diagnoses:   Right hip OA / pain  Discharge Diagnoses:  Principal Problem:   S/P right THA, AA Active Problems:   Overweight (BMI 25.0-29.9)   Hypokalemia  Past Medical History  Diagnosis Date  . HTN (hypertension)   . Arthritis   . Anemia     HPI: Erin Bass, 52 y.o. female, has a history of pain and functional disability in the right hip(s) due to arthritis and patient has failed non-surgical conservative treatments for greater than 12 weeks to include NSAID's and/or analgesics, corticosteriod injections, use of assistive devices and activity modification. Onset of symptoms was gradual starting 3+ years ago with gradually worsening course since that time.The patient noted no past surgery on the right hip(s). Patient currently rates pain in the right hip at 9 out of 10 with activity. Patient has night pain, worsening of pain with activity and weight bearing, trendelenberg gait, pain that interfers with activities of daily living and pain with passive range of motion. Patient has evidence of periarticular osteophytes and joint space narrowing by imaging studies. This condition presents safety issues increasing the risk of falls. There is no current active infection. Risks, benefits and expectations were discussed with the patient. Risks including but not limited to the risk of anesthesia, blood clots, nerve damage, blood vessel damage, failure of the prosthesis, infection and up to and including death. Patient understand the risks, benefits and expectations and wishes to proceed with surgery.   PCP: Ruthe Mannan, MD   Discharged Condition: good  Hospital Course:  Patient underwent the above  stated procedure on 12/20/2012. Patient tolerated the procedure well and brought to the recovery room in good condition and subsequently to the floor.  POD #1 BP: 137/81 ; Pulse: 75 ; Temp: 97.4 F (36.3 C) ; Resp: 16  Pt's foley was removed. IV was changed to a saline lock. Patient reports pain as mild, pain well controlled. No events throughout the night. Ready to be discharged home. Neurovascular intact, dorsiflexion/plantar flexion intact, incision: dressing C/D/I, no cellulitis present and compartment soft.   LABS  Basename    HGB  8.8  HCT  26.3    Discharge Exam: General appearance: alert, cooperative and no distress Extremities: Homans sign is negative, no sign of DVT, no edema, redness or tenderness in the calves or thighs and no ulcers, gangrene or trophic changes  Disposition:   Home-Health Care Svc with follow up in 2 weeks   Follow-up Information   Follow up with Shelda Pal, MD. Schedule an appointment as soon as possible for a visit in 2 weeks.   Specialty:  Orthopedic Surgery   Contact information:   [address] [place] [address] [phone]       Discharge Orders   Future Orders Complete By Expires   Call MD / Call 911  As directed    Comments:     If you experience chest pain or shortness of breath, CALL 911 and be transported to the hospital emergency room.  If you develope a fever above 101 F, pus (white drainage) or increased drainage or redness at the wound, or calf pain, call your surgeon's office.   Change dressing  As directed  Comments:     Maintain surgical dressing for 10-14 days, then replace with 4x4 guaze and tape. Keep the area dry and clean.   Constipation Prevention  As directed    Comments:     Drink plenty of fluids.  Prune juice may be helpful.  You may use a stool softener, such as Colace (over the counter) 100 mg twice a day.  Use MiraLax (over the counter) for constipation as needed.   Diet - low sodium  heart healthy  As directed    Discharge instructions  As directed    Comments:     Maintain surgical dressing for 10-14 days, then replace with gauze and tape. Keep the area dry and clean until follow up. Follow up in 2 weeks at [facility]. Call with any questions or concerns.   Increase activity slowly as tolerated  As directed    TED hose  As directed    Comments:     Use stockings (TED hose) for 2 weeks on both leg(s).  You may remove them at night for sleeping.   Weight bearing as tolerated  As directed    Questions:     Laterality:     Extremity:          Medication List    STOP taking these medications       traMADol 50 MG tablet  Commonly known as:  ULTRAM      TAKE these medications       aspirin 325 MG EC tablet  Take 1 tablet (325 mg total) by mouth 2 (two) times daily.     DSS 100 MG Caps  Take 100 mg by mouth 2 (two) times daily.     ferrous sulfate 325 (65 FE) MG tablet  Take 1 tablet (325 mg total) by mouth 3 (three) times daily after meals.     fish oil-omega-3 fatty acids 1000 MG capsule  Take 1 g by mouth as needed. Takes for joint pain     HYDROcodone-acetaminophen 7.5-325 MG per tablet  Commonly known as:  NORCO  Take 1-2 tablets by mouth every 4 (four) hours as needed for moderate pain.     lisinopril-hydrochlorothiazide 10-12.5 MG per tablet  Commonly known as:  PRINZIDE,ZESTORETIC  Take 1 tablet by mouth daily with breakfast.     methocarbamol 500 MG tablet  Commonly known as:  ROBAXIN  Take 1 tablet (500 mg total) by mouth every 6 (six) hours as needed for muscle spasms.     multivitamin tablet  Take 1 tablet by mouth daily.     polyethylene glycol packet  Commonly known as:  MIRALAX / GLYCOLAX  Take 17 g by mouth daily as needed for mild constipation.     potassium chloride SA 20 MEQ tablet  Commonly known as:  K-DUR,KLOR-CON  Take 1 tablet (20 mEq total) by mouth 2 (two) times daily.     SIMPONI ARIA 50 MG/4ML Soln  injection  Generic drug:  golimumab  Inject into the vein. Every two months         Signed: Anastasio Auerbach. Athanasia Stanwood   PAC  12/27/2012, 5:04 PM

## 2013-03-13 ENCOUNTER — Telehealth: Payer: Self-pay | Admitting: Family Medicine

## 2013-03-13 ENCOUNTER — Ambulatory Visit: Payer: BC Managed Care – PPO | Admitting: Family Medicine

## 2013-03-13 NOTE — Telephone Encounter (Signed)
Patient Information:  Caller Name: Darline  Phone: [phone]  Patient: Erin Bass, Erin Bass  Gender: Female  DOB: 02-Dec-1960  Age: 53 Years  PCP: [facility])  Pregnant: No  Office Follow Up:  Does the office need to follow up with this patient?: No  Instructions For The Office: N/A  RN Note:  Pt declines dispo and will keep appt with PCP today Pt agrees to call back if sxs become worse  Symptoms  Reason For Call & Symptoms: 12/20/12 Pt had a total hip replacement.  Pt has appt today for 2:30.  The incision never healed.  Pt states that she has green drainage for site.  Pt reports having bumps at the site.  Pt has called Ortho and was rx Cephalexin.  Pt reports that she has been taking about 4 days and no improvement.  Reviewed Health History In EMR: Yes  Reviewed Medications In EMR: Yes  Reviewed Allergies In EMR: Yes  Reviewed Surgeries / Procedures: Yes  Date of Onset of Symptoms: 03/06/2013 OB / GYN:  LMP: Unknown  Guideline(s) Used:  Wound Infection  Disposition Per Guideline:   Go to ED Now (or to Office with PCP Approval)  Reason For Disposition Reached:   Black (necrotic) or blisters develop in wound  Advice Given:  Call Back If:   You become worse  Patient Refused Recommendation:  Patient Refused Care Advice  Pt has appt at 1430 with PCP and will keep that appt

## 2013-03-13 NOTE — Telephone Encounter (Signed)
Spoke to pt and informed her per Dr Dayton Martes, she will need to f/u with surgeon. Pt verbally expressed understanding and states that she will contact them; 03/13/13 appt cancelled.

## 2013-04-10 ENCOUNTER — Ambulatory Visit: Payer: BC Managed Care – PPO | Admitting: Family Medicine

## 2013-04-13 ENCOUNTER — Ambulatory Visit (INDEPENDENT_AMBULATORY_CARE_PROVIDER_SITE_OTHER): Payer: BC Managed Care – PPO | Admitting: Family Medicine

## 2013-04-13 ENCOUNTER — Telehealth: Payer: Self-pay | Admitting: Family Medicine

## 2013-04-13 ENCOUNTER — Encounter: Payer: Self-pay | Admitting: Family Medicine

## 2013-04-13 VITALS — BP 158/104 | HR 69 | Temp 97.8°F | Ht 61.0 in | Wt 157.0 lb

## 2013-04-13 DIAGNOSIS — L989 Disorder of the skin and subcutaneous tissue, unspecified: Secondary | ICD-10-CM

## 2013-04-13 DIAGNOSIS — M069 Rheumatoid arthritis, unspecified: Secondary | ICD-10-CM

## 2013-04-13 DIAGNOSIS — I10 Essential (primary) hypertension: Secondary | ICD-10-CM

## 2013-04-13 MED ORDER — LISINOPRIL-HYDROCHLOROTHIAZIDE 10-12.5 MG PO TABS: 1.0000 | ORAL_TABLET | Freq: Every day | ORAL | Status: DC

## 2013-04-13 MED ORDER — TRAMADOL HCL 50 MG PO TABS: 50.0000 mg | ORAL_TABLET | Freq: Four times a day (QID) | ORAL | Status: DC | PRN

## 2013-04-13 NOTE — Telephone Encounter (Signed)
Relevant patient education assigned to patient using Emmi. ° °

## 2013-04-13 NOTE — Assessment & Plan Note (Signed)
Well controlled on current rx.

## 2013-04-13 NOTE — Patient Instructions (Signed)
Good to see you. I will call you with your lab results. We will call you with an appointment to see a dermatologist.

## 2013-04-13 NOTE — Addendum Note (Signed)
Addended by: Alvina Chou on: 04/13/2013 09:01 AM   Modules accepted: Orders

## 2013-04-13 NOTE — Assessment & Plan Note (Signed)
Agree with Ms. Zimny that this is an unusual appearing rash that does warrant further work up, especially since she is taking an immunomodulator for RA.  Will check lab work today. Wound and viral culture obtained today and sent. Will also refer to derm for further work up/biopsy if cx unremarkable. The patient indicates understanding of these issues and agrees with the plan.  Orders Placed This Encounter  Procedures  . CBC with Differential  . Comprehensive metabolic panel  . HSV(herpes simplex vrs) 1+2 ab-IgM  . Ambulatory referral to Dermatology

## 2013-04-13 NOTE — Progress Notes (Signed)
Subjective:    [name], female    DOB: 1960/08/15, 53 y.o.   MRN: [id number]  HPI  Erin Bass, a 53 y.o. female very pleasant female here for:  1.  Rash on right upper thigh- started shortly after her hip replacement L[facility]oyce [facility]ers) on 12/20/2012.  Started as a small area of draining vesicles- drainage is bloody but clear.  Then covered the entire surgical site and developed other vesicles on arms and legs although much small distribution.  Saw Dr. Charlann Boxer on 2/23- per pt, given abx and prednisone and no wound cx done.  Was supposed to have symponi but due to this complication, it was postponed to April.  Saw Dr. Charlann Boxer for follow up in 3/11.  Per pt, she was never told what this rash was.  Rash has improved, still drains if she presses on it but not as scaly or "angry looking."  RA-   Unfortunately she has had a difficult course with her RA.  On symponi.  Seeing Dr. Dareen Piano.  Next appt 4/22.    She has started  HTN- changed to lisinopril-HCTZ in March. No HA,  CP or SOB.  Patient Active Problem List   Diagnosis Date Noted  . Skin lesion of right leg 04/13/2013  . Overweight (BMI 25.0-29.9) 12/21/2012  . Hypokalemia 12/21/2012  . 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 Rheumatoid arthritis 10/22/2011  . Microcytic anemia 05/28/2011  . POSTMENOPAUSAL STATUS 03/28/2009  . HYPERLIPIDEMIA 01/24/2008  . HYPERTENSION 09/06/2006   Past Medical History  Diagnosis Date  . HTN (hypertension)   . Arthritis   . Anemia    Past Surgical History  Procedure Laterality Date  . Partial hysterectomy  1/06    fibroids; ovaries left in   . Cesarean section      x2  . Bone spur - removal    . Abdominal hysterectomy      Partial- Uterus only  . Tubal ligation    . Total hip arthroplasty Right 12/20/2012    Procedure: RIGHT TOTAL HIP ARTHROPLASTY ANTERIOR APPROACH;  Surgeon: Shelda Pal, MD;  Location: [facility];  Service: Orthopedics;   Laterality: Right;   History  Substance Use Topics  . Smoking status: Never Smoker   . Smokeless tobacco: Not on file  . Alcohol Use: Yes     Comment: occassionally   Family History  Problem Relation Age of Onset  . Depression      both sides of family   . Anemia Mother    No Known Allergies Current Outpatient Prescriptions on File Prior to Visit  Medication Sig Dispense Refill  . docusate sodium 100 MG CAPS Take 100 mg by mouth 2 (two) times daily.  10 capsule  0  . ferrous sulfate 325 (65 FE) MG tablet Take 1 tablet (325 mg total) by mouth 3 (three) times daily after meals.    3  . fish oil-omega-3 fatty acids 1000 MG capsule Take 1 g by mouth as needed. Takes for joint pain      . golimumab (SIMPONI ARIA) 50 MG/4ML SOLN Inject into the vein. Every two months      . Multiple Vitamin (MULTIVITAMIN) tablet Take 1 tablet by mouth daily.       No current facility-administered medications on file prior to visit.   The PMH, PSH, Social History, Family History, Medications, and allergies have been reviewed in [facility], and have been updated if  relevant.   Review of Systems  See HPI    Objective:   Physical Exam BP 158/104  Pulse 69  Temp(Src) 97.8 F (36.6 C) (Oral)  Ht 5\' 1"  (1.549 m)  Wt 157 lb (71.215 kg)  BMI 29.68 kg/m2  SpO2 98% BP Readings from Last 3 Encounters:  04/13/13 158/104  12/21/12 128/85  12/21/12 128/85     GEN: Well-developed,well-nourished,in no acute distress; alert,appropriate and cooperative throughout examination HEENT: Normocephalic and atraumatic without obvious abnormalities. Ears, externally no deformities PULM: Breathing comfortably in no respiratory distress EXT: deforminty of right wrist PSYCH: Normally interactive. Cooperative during the interview. Pleasant. Friendly and conversant. Not anxious or depressed appearing. Normal, full affect. Skin:  Confluent, hyperpigmented, raised, vesicular rash covering entire surgical wound on right upper  thigh- when I press on a vesicular area, clear drainage is expressed.     Assessment & Plan:

## 2013-04-13 NOTE — Assessment & Plan Note (Addendum)
Followed by Rhuem. On symponi. Tramadol rx refilled for pain.

## 2013-04-13 NOTE — Progress Notes (Signed)
Pre visit review using our clinic review tool, if applicable. No additional management support is needed unless otherwise documented below in the visit note. 

## 2013-04-14 LAB — HSV(HERPES SIMPLEX VRS) I + II AB-IGM: Herpes Simplex Vrs I&II-IgM Ab (EIA): 1.12 INDEX — ABNORMAL HIGH

## 2013-04-16 LAB — WOUND CULTURE
GRAM STAIN: NONE SEEN
Gram Stain: NONE SEEN
Gram Stain: NONE SEEN
ORGANISM ID, BACTERIA: NO GROWTH

## 2013-04-24 LAB — VIRAL CULTURE VIRC: Organism ID, Bacteria: NEGATIVE

## 2013-05-01 ENCOUNTER — Encounter: Payer: Self-pay | Admitting: Family Medicine

## 2013-05-16 ENCOUNTER — Other Ambulatory Visit: Payer: Self-pay | Admitting: *Deleted

## 2013-05-16 MED ORDER — TRAMADOL HCL 50 MG PO TABS: 50.0000 mg | ORAL_TABLET | Freq: Four times a day (QID) | ORAL | Status: DC | PRN

## 2013-05-16 NOTE — Telephone Encounter (Signed)
Spoke to pt and informed her Rx has been called in to requested pharmacy 

## 2013-05-16 NOTE — Telephone Encounter (Signed)
#   60x 0 last filled 04/13/13, last ov 04/13/13.

## 2013-06-13 ENCOUNTER — Telehealth: Payer: Self-pay

## 2013-06-13 DIAGNOSIS — Z1211 Encounter for screening for malignant neoplasm of colon: Secondary | ICD-10-CM

## 2013-06-13 NOTE — Telephone Encounter (Signed)
Referral placed.

## 2013-06-13 NOTE — Telephone Encounter (Signed)
Pt left v/m requesting referral and appt scheduled for colonoscopy.

## 2013-06-20 ENCOUNTER — Encounter: Payer: BC Managed Care – PPO | Admitting: Family Medicine

## 2013-07-12 ENCOUNTER — Other Ambulatory Visit: Payer: Self-pay | Admitting: *Deleted

## 2013-07-12 MED ORDER — TRAMADOL HCL 50 MG PO TABS: 50.0000 mg | ORAL_TABLET | Freq: Four times a day (QID) | ORAL | Status: DC | PRN

## 2013-07-12 NOTE — Telephone Encounter (Signed)
Pt requesting medication refill. Last f/u appt 05/2012 with CPE sched 08/2013. pls advise

## 2013-07-13 NOTE — Telephone Encounter (Signed)
Spoke to pt and informed her Rx has been called in to requested pharmacy 

## 2013-08-07 ENCOUNTER — Other Ambulatory Visit: Payer: Self-pay

## 2013-08-07 DIAGNOSIS — Z1231 Encounter for screening mammogram for malignant neoplasm of breast: Secondary | ICD-10-CM

## 2013-08-22 ENCOUNTER — Ambulatory Visit
Admission: RE | Admit: 2013-08-22 | Discharge: 2013-08-22 | Disposition: A | Discharge status: Home / Self Care | Payer: BC Managed Care – PPO | Source: Ambulatory Visit

## 2013-08-22 DIAGNOSIS — Z1231 Encounter for screening mammogram for malignant neoplasm of breast: Secondary | ICD-10-CM

## 2013-08-31 LAB — HM COLONOSCOPY

## 2013-09-01 ENCOUNTER — Encounter: Payer: Self-pay | Admitting: Family Medicine

## 2013-09-07 ENCOUNTER — Encounter: Payer: Self-pay | Admitting: *Deleted

## 2013-09-07 ENCOUNTER — Other Ambulatory Visit (HOSPITAL_COMMUNITY)
Admission: RE | Admit: 2013-09-07 | Discharge: 2013-09-07 | Disposition: A | Discharge status: Home / Self Care | Payer: BC Managed Care – PPO | Source: Ambulatory Visit | Attending: Family Medicine | Admitting: Family Medicine

## 2013-09-07 ENCOUNTER — Encounter: Payer: Self-pay | Admitting: Family Medicine

## 2013-09-07 ENCOUNTER — Ambulatory Visit (INDEPENDENT_AMBULATORY_CARE_PROVIDER_SITE_OTHER): Payer: BC Managed Care – PPO | Admitting: Family Medicine

## 2013-09-07 VITALS — BP 150/102 | HR 64 | Temp 98.2°F | Ht 61.0 in | Wt 162.2 lb

## 2013-09-07 DIAGNOSIS — Z1151 Encounter for screening for human papillomavirus (HPV): Secondary | ICD-10-CM | POA: Insufficient documentation

## 2013-09-07 DIAGNOSIS — M069 Rheumatoid arthritis, unspecified: Secondary | ICD-10-CM

## 2013-09-07 DIAGNOSIS — Z01419 Encounter for gynecological examination (general) (routine) without abnormal findings: Secondary | ICD-10-CM

## 2013-09-07 DIAGNOSIS — Z0000 Encounter for general adult medical examination without abnormal findings: Secondary | ICD-10-CM

## 2013-09-07 DIAGNOSIS — E785 Hyperlipidemia, unspecified: Secondary | ICD-10-CM

## 2013-09-07 DIAGNOSIS — I10 Essential (primary) hypertension: Secondary | ICD-10-CM

## 2013-09-07 LAB — LIPID PANEL
CHOLESTEROL: 278 mg/dL — AB (ref 0–200)
HDL: 108 mg/dL (ref >39.00)
LDL CALC: 158 mg/dL — AB (ref 0–99)
NonHDL: 170
TRIGLYCERIDES: 58 mg/dL (ref 0.0–149.0)
Total CHOL/HDL Ratio: 3
VLDL: 11.6 mg/dL (ref 0.0–40.0)

## 2013-09-07 LAB — COMPREHENSIVE METABOLIC PANEL
ALK PHOS: 61 U/L (ref 39–117)
ALT: 17 U/L (ref 0–35)
AST: 19 U/L (ref 0–37)
Albumin: 3.9 g/dL (ref 3.5–5.2)
BILIRUBIN TOTAL: 0.5 mg/dL (ref 0.2–1.2)
BUN: 15 mg/dL (ref 6–23)
CO2: 29 mEq/L (ref 19–32)
CREATININE: 1 mg/dL (ref 0.4–1.2)
Calcium: 9.8 mg/dL (ref 8.4–10.5)
Chloride: 101 mEq/L (ref 96–112)
GFR: 78.95 mL/min (ref >60.00)
GLUCOSE: 89 mg/dL (ref 70–99)
Potassium: 3.8 mEq/L (ref 3.5–5.1)
SODIUM: 138 meq/L (ref 135–145)
Total Protein: 7.2 g/dL (ref 6.0–8.3)

## 2013-09-07 LAB — CBC WITH DIFFERENTIAL/PLATELET
Basophils Absolute: 0.1 10*3/uL (ref 0.0–0.1)
Basophils Relative: 0.9 % (ref 0.0–3.0)
EOS PCT: 4.4 % (ref 0.0–5.0)
Eosinophils Absolute: 0.3 10*3/uL (ref 0.0–0.7)
HCT: 36 % (ref 36.0–46.0)
Hemoglobin: 11.3 g/dL — ABNORMAL LOW (ref 12.0–15.0)
LYMPHS ABS: 1.6 10*3/uL (ref 0.7–4.0)
Lymphocytes Relative: 22.3 % (ref 12.0–46.0)
MCHC: 31.4 g/dL (ref 30.0–36.0)
MCV: 75 fl — AB (ref 78.0–100.0)
MONO ABS: 0.6 10*3/uL (ref 0.1–1.0)
Monocytes Relative: 7.8 % (ref 3.0–12.0)
NEUTROS ABS: 4.6 10*3/uL (ref 1.4–7.7)
Neutrophils Relative %: 64.6 % (ref 43.0–77.0)
PLATELETS: 239 10*3/uL (ref 150.0–400.0)
RBC: 4.8 Mil/uL (ref 3.87–5.11)
RDW: 14.3 % (ref 11.5–15.5)
WBC: 7.1 10*3/uL (ref 4.0–10.5)

## 2013-09-07 LAB — TSH: TSH: 0.94 u[IU]/mL (ref 0.35–4.50)

## 2013-09-07 MED ORDER — LISINOPRIL-HYDROCHLOROTHIAZIDE 10-12.5 MG PO TABS: 2.0000 | ORAL_TABLET | Freq: Every day | ORAL | Status: DC

## 2013-09-07 MED ORDER — TRAMADOL HCL 50 MG PO TABS: 50.0000 mg | ORAL_TABLET | Freq: Four times a day (QID) | ORAL | Status: DC | PRN

## 2013-09-07 NOTE — Assessment & Plan Note (Signed)
Deteriorated. Per pt, has been "running high" at home too. Increase dose of lisinopril HCTZ to 2 tabs by mouth daily. She will monitor her BP and call me with an update.

## 2013-09-07 NOTE — Progress Notes (Signed)
Pre visit review using our clinic review tool, if applicable. No additional management support is needed unless otherwise documented below in the visit note. 

## 2013-09-07 NOTE — Addendum Note (Signed)
Addended by: Desmond Dike on: 09/07/2013 09:40 AM   Modules accepted: Orders

## 2013-09-07 NOTE — Patient Instructions (Signed)
Great to see you. I will call you with your lab results from today .   

## 2013-09-07 NOTE — Assessment & Plan Note (Signed)
Reviewed preventive care protocols, scheduled due services, and updated immunizations Discussed nutrition, exercise, diet, and healthy lifestyle.  

## 2013-09-07 NOTE — Assessment & Plan Note (Signed)
Stable without treatment. Did discuss that this could cause permanent joint damage to go without tx but she is fearful of trying another medication. No changes today.

## 2013-09-07 NOTE — Progress Notes (Signed)
Subjective:    [name], female    DOB: 1960-08-06, 53 y.o.   MRN: [id number]  HPI  Erin Bass, a 53 y.o. female very pleasant female with h/o RA here for CPX.  S/p hysterectomy- she is not sure which "parts" she still has. No h/o abnormal pap smears. Neg mammogram 08/23/13 Colonoscopy 08/31/13- Dr. Caryl Never Kathryne Sharper).  Overall doing well, grandson will be 1 yo later this month.  RA-   Unfortunately she has had a difficult course with her RA.  Last note in Epic from Dr. Dareen Piano was from 01/16/13- note reviewed.  Advised continued Simponi at that time.  Stopped Simponi after she had a severe rash.  Has not been taking anything other than fish oil and glucosamine for it now.  Working on watching her stress level and getting more rest.   Pain is under good control at this point.  She is very fearful of restarting medication. Still using her cane when she has to walk longer distances. Working on her "spiritual" health which she feels has helped her physically.  HTN- On lisinopril-HCTZ.  BP elevated today. No HA,  CP or SOB.  Lab Results  Component Value Date   CHOL 276* 12/02/2012   HDL 110.30 12/02/2012   LDLCALC 83 10/30/2010   LDLDIRECT 142.9 12/02/2012   TRIG 44.0 12/02/2012   CHOLHDL 3 12/02/2012   Lab Results  Component Value Date   WBC 10.9* 12/21/2012   HGB 8.8* 12/21/2012   HCT 26.3* 12/21/2012   MCV 71.5* 12/21/2012   PLT 199 12/21/2012   Lab Results  Component Value Date   CREATININE 0.72 12/21/2012    Patient Active Problem List   Diagnosis Date Noted  . Routine general medical examination at a health care facility 09/07/2013  . Encounter for routine gynecological examination 09/07/2013  . Skin lesion of right leg 04/13/2013  . Overweight (BMI 25.0-29.9) 12/21/2012  . Hypokalemia 12/21/2012  . 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 Rheumatoid arthritis 10/22/2011  . Microcytic anemia 05/28/2011   . POSTMENOPAUSAL STATUS 03/28/2009  . HYPERLIPIDEMIA 01/24/2008  . HYPERTENSION 09/06/2006   Past Medical History  Diagnosis Date  . HTN (hypertension)   . Arthritis   . Anemia    Past Surgical History  Procedure Laterality Date  . Partial hysterectomy  1/06    fibroids; ovaries left in   . Cesarean section      x2  . Bone spur - removal    . Abdominal hysterectomy      Partial- Uterus only  . Tubal ligation    . Total hip arthroplasty Right 12/20/2012    Procedure: RIGHT TOTAL HIP ARTHROPLASTY ANTERIOR APPROACH;  Surgeon: Shelda Pal, MD;  Location: [facility];  Service: Orthopedics;  Laterality: Right;   History  Substance Use Topics  . Smoking status: Never Smoker   . Smokeless tobacco: Not on file  . Alcohol Use: Yes     Comment: occassionally   Family History  Problem Relation Age of Onset  . Depression      both sides of family   . Anemia Mother    No Known Allergies Current Outpatient Prescriptions on File Prior to Visit  Medication Sig Dispense Refill  . ferrous sulfate 325 (65 FE) MG tablet Take 1 tablet (325 mg total) by mouth 3 (three) times daily after meals.    3  . fish oil-omega-3 fatty acids 1000 MG capsule  Take 1 g by mouth as needed. Takes for joint pain      . lisinopril-hydrochlorothiazide (PRINZIDE,ZESTORETIC) 10-12.5 MG per tablet Take 1 tablet by mouth daily with breakfast.  90 tablet  3  . Multiple Vitamin (MULTIVITAMIN) tablet Take 1 tablet by mouth daily.       No current facility-administered medications on file prior to visit.   The PMH, PSH, Social History, Family History, Medications, and allergies have been reviewed in [facility], and have been updated if relevant.   Review of Systems  See HPI Denies HP, blurred vision or CP Denies any vaginal irritation or dysuria. Denies any changes in bowel habits No blood in stool No LE edema + intermittent joint pain No blurred vision No rashes currently No breast tenderness    Objective:    Physical Exam BP 150/102  Pulse 64  Temp(Src) 98.2 F (36.8 C) (Oral)  Ht 5\' 1"  (1.549 m)  Wt 162 lb 4 oz (73.596 kg)  BMI 30.67 kg/m2  SpO2 98% BP Readings from Last 3 Encounters:  09/07/13 150/102  04/13/13 158/104  12/21/12 128/85      General:  Well-developed,well-nourished,in no acute distress; alert,appropriate and cooperative throughout examination Head:  normocephalic and atraumatic.   Eyes:  vision grossly intact, pupils equal, pupils round, and pupils reactive to light.   Ears:  R ear normal and L ear normal.   Nose:  no external deformity.   Mouth:  good dentition.   Neck:  No deformities, masses, or tenderness noted. Breasts:  No mass, nodules, thickening, tenderness, bulging, retraction, inflamation, nipple discharge or skin changes noted.   Lungs:  Normal respiratory effort, chest expands symmetrically. Lungs are clear to auscultation, no crackles or wheezes. Heart:  Normal rate and regular rhythm. S1 and S2 normal without gallop, murmur, click, rub or other extra sounds. Abdomen:  Bowel sounds positive,abdomen soft and non-tender without masses, organomegaly or hernias noted. Rectal:  no external abnormalities.   Genitalia:  Pelvic Exam:        External: normal female genitalia without lesions or masses        Vagina: normal without lesions or masses        Adnexa: normal bimanual exam without masses or fullness        Uterus: absent        Pap smear: performed Msk:  + bilateral wrist deformity Extremities:  No clubbing, cyanosis, edema, or deformity noted with normal full range of motion of all joints.   Neurologic:  alert & oriented X3 and gait normal.   Skin:  Intact without suspicious lesions or rashes Cervical Nodes:  No lymphadenopathy noted Axillary Nodes:  No palpable lymphadenopathy Psych:  Cognition and judgment appear intact. Alert and cooperative with normal attention span and concentration. No apparent delusions, illusions, hallucinations      Assessment & Plan:

## 2013-09-07 NOTE — Assessment & Plan Note (Signed)
-   Recheck lipid panel today

## 2013-09-08 LAB — CYTOLOGY - PAP

## 2013-09-11 ENCOUNTER — Encounter: Payer: Self-pay | Admitting: *Deleted

## 2013-11-01 ENCOUNTER — Other Ambulatory Visit: Payer: Self-pay | Admitting: *Deleted

## 2013-11-01 MED ORDER — TRAMADOL HCL 50 MG PO TABS: 50.0000 mg | ORAL_TABLET | Freq: Four times a day (QID) | ORAL | Status: DC | PRN

## 2013-11-01 NOTE — Telephone Encounter (Signed)
Pt requesting medication refill. Last f/u appt 08/2013-CPE. Ok to fill per Dr Dayton Martes. Rx to be faxed to requested pharmacy before end of day, today

## 2013-11-03 ENCOUNTER — Other Ambulatory Visit: Payer: Self-pay

## 2014-01-01 ENCOUNTER — Other Ambulatory Visit: Payer: Self-pay | Admitting: Family Medicine

## 2014-01-01 MED ORDER — TRAMADOL HCL 50 MG PO TABS: 50.0000 mg | ORAL_TABLET | Freq: Four times a day (QID) | ORAL | Status: DC | PRN

## 2014-01-01 NOTE — Telephone Encounter (Signed)
Pt requesting medication refill. Last f/u appt 08/2013-CPE. pls advise 

## 2014-01-01 NOTE — Telephone Encounter (Signed)
Rx called in to requested pharmacy 

## 2014-02-14 ENCOUNTER — Other Ambulatory Visit: Payer: Self-pay | Admitting: Family Medicine

## 2014-02-14 MED ORDER — TRAMADOL HCL 50 MG PO TABS: 50.0000 mg | ORAL_TABLET | Freq: Four times a day (QID) | ORAL | Status: DC | PRN

## 2014-02-14 NOTE — Telephone Encounter (Signed)
Rx called in to requested pharmacy 

## 2014-02-14 NOTE — Telephone Encounter (Signed)
Last ov 08/2013- CPE

## 2014-02-28 ENCOUNTER — Other Ambulatory Visit: Payer: Self-pay | Admitting: Family Medicine

## 2014-02-28 ENCOUNTER — Encounter: Payer: Self-pay | Admitting: Family Medicine

## 2014-02-28 ENCOUNTER — Ambulatory Visit (INDEPENDENT_AMBULATORY_CARE_PROVIDER_SITE_OTHER): Payer: BLUE CROSS/BLUE SHIELD | Admitting: Family Medicine

## 2014-02-28 VITALS — BP 112/80 | HR 75 | Temp 98.5°F | Ht 61.0 in | Wt 164.5 lb

## 2014-02-28 DIAGNOSIS — E785 Hyperlipidemia, unspecified: Secondary | ICD-10-CM

## 2014-02-28 DIAGNOSIS — M069 Rheumatoid arthritis, unspecified: Secondary | ICD-10-CM

## 2014-02-28 DIAGNOSIS — I10 Essential (primary) hypertension: Secondary | ICD-10-CM

## 2014-02-28 LAB — COMPREHENSIVE METABOLIC PANEL
ALBUMIN: 4.1 g/dL (ref 3.5–5.2)
ALT: 11 U/L (ref 0–35)
AST: 15 U/L (ref 0–37)
Alkaline Phosphatase: 63 U/L (ref 39–117)
BUN: 16 mg/dL (ref 6–23)
CO2: 29 meq/L (ref 19–32)
Calcium: 9.7 mg/dL (ref 8.4–10.5)
Chloride: 101 mEq/L (ref 96–112)
Creatinine, Ser: 0.89 mg/dL (ref 0.40–1.20)
GFR: 84.97 mL/min (ref >60.00)
Glucose, Bld: 85 mg/dL (ref 70–99)
POTASSIUM: 3.6 meq/L (ref 3.5–5.1)
SODIUM: 137 meq/L (ref 135–145)
TOTAL PROTEIN: 7.6 g/dL (ref 6.0–8.3)
Total Bilirubin: 0.4 mg/dL (ref 0.2–1.2)

## 2014-02-28 LAB — LIPID PANEL
CHOL/HDL RATIO: 3
Cholesterol: 258 mg/dL — ABNORMAL HIGH (ref 0–200)
HDL: 86.5 mg/dL (ref >39.00)
LDL Cholesterol: 161 mg/dL — ABNORMAL HIGH (ref 0–99)
NONHDL: 171.5
Triglycerides: 51 mg/dL (ref 0.0–149.0)
VLDL: 10.2 mg/dL (ref 0.0–40.0)

## 2014-02-28 MED ORDER — SIMVASTATIN 10 MG PO TABS: 10.0000 mg | ORAL_TABLET | Freq: Every day | ORAL | Status: DC

## 2014-02-28 MED ORDER — LISINOPRIL-HYDROCHLOROTHIAZIDE 10-12.5 MG PO TABS: 1.0000 | ORAL_TABLET | Freq: Every day | ORAL | Status: DC

## 2014-02-28 MED ORDER — PREDNISONE 20 MG PO TABS: ORAL_TABLET | ORAL | Status: DC

## 2014-02-28 NOTE — Progress Notes (Signed)
Subjective:   [name], female    DOB: 1960/04/09, 54 y.o.   MRN: [id number]  Erin Bass is a pleasant 54 y.o. year old female who presents to clinic today with Wrist Pain and Follow-up  on 02/28/2014  HPI: RA-   Unfortunately she has had a difficult course with her RA.  Last note in Epic from Dr. Dareen Piano was from 01/24/13- note reviewed.  Advised continued Simponi at that time but has not been back since because had severe allergic reaction- rash- to Simponi.    Unfortunately now pain is severe.  Flares tend to occur during the Winter.  Still using the cane for long distances. Not working so has less stress in her life. Watching her diet- notices salt worsens her triggers.  Only taking Tramadol at night because she does not want to be sedated or dependent on any rx.  Now having severe flare in her right wrist.    HTN- since she cut out salt in her diet, has only been taking one Prinzide tablet and BP has been well controlled.  No dizziness when she stands from a seated position. Lab Results  Component Value Date   CREATININE 1.0 09/07/2013     HLD- cholesterol was elevated in 08/2013.  She would like to get it rechecked now that her diet has improved. Lab Results  Component Value Date   CHOL 278* 09/07/2013   HDL 108.00 09/07/2013   LDLCALC 158* 09/07/2013   LDLDIRECT 142.9 12/02/2012   TRIG 58.0 09/07/2013   CHOLHDL 3 09/07/2013   Current Outpatient Prescriptions on File Prior to Visit  Medication Sig Dispense Refill  . ferrous sulfate 325 (65 FE) MG tablet Take 1 tablet (325 mg total) by mouth 3 (three) times daily after meals.  3  . fish oil-omega-3 fatty acids 1000 MG capsule Take 1 g by mouth as needed. Takes for joint pain    . Multiple Vitamin (MULTIVITAMIN) tablet Take 1 tablet by mouth daily.    . traMADol (ULTRAM) 50 MG tablet Take 1 tablet (50 mg total) by mouth every 6 (six) hours as needed for moderate pain or severe pain. 60 tablet 0   No  current facility-administered medications on file prior to visit.    Allergies  Allergen Reactions  . Simponi [Golimumab] Rash    Past Medical History  Diagnosis Date  . HTN (hypertension)   . Arthritis   . Anemia     Past Surgical History  Procedure Laterality Date  . Partial hysterectomy  1/06    fibroids; ovaries left in   . Cesarean section      x2  . Bone spur - removal    . Abdominal hysterectomy      Partial- Uterus only  . Tubal ligation    . 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 Depression      both sides of family   . Anemia Mother     History   Social History  . Marital Status: Married    Spouse Name: N/A  . Number of Children: N/A  . Years of Education: N/A   Occupational History  . Not on file.   Social History Main Topics  . Smoking status: Never Smoker   . Smokeless  tobacco: Not on file  . Alcohol Use: Yes     Comment: occassionally  . Drug Use: No  . Sexual Activity: Not on file   Other Topics Concern  . Not on file   Social History Narrative   Married; 2 children, 1 at home.    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   The PMH, PSH, Social History, Family History, Medications, and allergies have been reviewed in [facility], and have been updated if relevant.   Review of Systems  Constitutional: Negative.   HENT: Negative.   Cardiovascular: Negative.   Gastrointestinal: Negative.   Endocrine: Negative.   Genitourinary: Negative.   Musculoskeletal: Positive for joint swelling and arthralgias.  Skin: Negative.   Allergic/Immunologic: Negative.   Neurological: Negative.   Hematological: Negative.   Psychiatric/Behavioral: Negative.   All other systems reviewed and are negative.      Objective:    BP 112/80 mmHg   Pulse 75  Temp(Src) 98.5 F (36.9 C) (Oral)  Ht 5\' 1"  (1.549 m)  Wt 164 lb 8 oz (74.617 kg)  BMI 31.10 kg/m2   Physical Exam  Constitutional: She is oriented to person, place, and time. She appears well-developed and well-nourished. No distress.  HENT:  Head: Normocephalic and atraumatic.  Eyes: Conjunctivae are normal.  Neck: Normal range of motion.  Cardiovascular: Normal rate.   Pulmonary/Chest: Effort normal.  Musculoskeletal: She exhibits edema.       Right wrist: She exhibits decreased range of motion, tenderness, swelling and deformity.  Neurological: She is alert and oriented to person, place, and time. No cranial nerve deficit.  Skin: Skin is warm and dry.  Psychiatric: She has a normal mood and affect. Her behavior is normal. Judgment and thought content normal.  Nursing note and vitals reviewed.         Assessment & Plan:   Rheumatoid arthritis  HLD (hyperlipidemia) - Plan: Comprehensive metabolic panel, Lipid panel  Essential hypertension No Follow-up on file.

## 2014-02-28 NOTE — Assessment & Plan Note (Signed)
Well controlled on one prinzide tablet. Will decrease dose on med list and advised to look for signs of orthostasis. She has bp machine at home as well.

## 2014-02-28 NOTE — Assessment & Plan Note (Signed)
Deteriorated. Not currently on any rx and has not seen rheum in over a year.  Discussed tx options.  Winter seems to be worse for her, actually did well in the summer which she thinks is due to her lifestyle changes.  Agreed to a course of steroids- eRx for prednisone sent. Call or return to clinic prn if these symptoms worsen or fail to improve as anticipated.  The patient indicates understanding of these issues and agrees with the plan.

## 2014-02-28 NOTE — Patient Instructions (Signed)
Please take prednisone as directed- with food and in the mornings.

## 2014-02-28 NOTE — Assessment & Plan Note (Signed)
Likely improved with change in diet. Recheck fasting lipid panel today.

## 2014-02-28 NOTE — Progress Notes (Signed)
Pre visit review using our clinic review tool, if applicable. No additional management support is needed unless otherwise documented below in the visit note. 

## 2014-03-13 ENCOUNTER — Encounter: Payer: Self-pay | Admitting: Family Medicine

## 2014-03-26 ENCOUNTER — Other Ambulatory Visit: Payer: Self-pay | Admitting: Family Medicine

## 2014-03-26 MED ORDER — TRAMADOL HCL 50 MG PO TABS: 50.0000 mg | ORAL_TABLET | Freq: Four times a day (QID) | ORAL | Status: DC | PRN

## 2014-03-26 NOTE — Telephone Encounter (Signed)
Rx called in to requested pharmacy 

## 2014-03-26 NOTE — Telephone Encounter (Signed)
Last f/u appt 08/2013 

## 2014-05-07 ENCOUNTER — Other Ambulatory Visit: Payer: Self-pay | Admitting: Family Medicine

## 2014-05-07 MED ORDER — TRAMADOL HCL 50 MG PO TABS: 50.0000 mg | ORAL_TABLET | Freq: Four times a day (QID) | ORAL | Status: DC | PRN

## 2014-05-07 NOTE — Telephone Encounter (Signed)
Rx called in to requested pharmacy 

## 2014-05-07 NOTE — Telephone Encounter (Signed)
Last f/u appt 02/2014 

## 2014-06-12 ENCOUNTER — Other Ambulatory Visit: Payer: Self-pay | Admitting: Family Medicine

## 2014-06-12 MED ORDER — TRAMADOL HCL 50 MG PO TABS: 50.0000 mg | ORAL_TABLET | Freq: Four times a day (QID) | ORAL | Status: DC | PRN

## 2014-06-12 NOTE — Telephone Encounter (Signed)
Last f/u appt 08/2013-CPE 

## 2014-06-12 NOTE — Telephone Encounter (Signed)
Rx called in to requested pharmacy 

## 2014-06-25 ENCOUNTER — Encounter: Payer: Self-pay | Admitting: Primary Care

## 2014-06-25 ENCOUNTER — Ambulatory Visit (INDEPENDENT_AMBULATORY_CARE_PROVIDER_SITE_OTHER): Payer: BLUE CROSS/BLUE SHIELD | Admitting: Primary Care

## 2014-06-25 VITALS — BP 148/104 | HR 87 | Temp 98.2°F | Ht 61.0 in | Wt 166.8 lb

## 2014-06-25 DIAGNOSIS — J019 Acute sinusitis, unspecified: Secondary | ICD-10-CM | POA: Diagnosis not present

## 2014-06-25 MED ORDER — FLUTICASONE PROPIONATE 50 MCG/ACT NA SUSP: 2.0000 | Freq: Every day | NASAL | Status: DC

## 2014-06-25 MED ORDER — AMOXICILLIN-POT CLAVULANATE 875-125 MG PO TABS: 1.0000 | ORAL_TABLET | Freq: Two times a day (BID) | ORAL | Status: DC

## 2014-06-25 NOTE — Progress Notes (Signed)
Pre visit review using our clinic review tool, if applicable. No additional management support is needed unless otherwise documented below in the visit note. 

## 2014-06-25 NOTE — Progress Notes (Signed)
Subjective:    [name], female    DOB: December 30, 1960, 54 y.o.   MRN: [id number]  HPI  Erin Bass is a 54 year old female who presents today with a chief complaint of nasal congestion and sinus pressure that have been present for 2 weeks. She also reports cough that has been present intermittently throughout the past 2 weeks. She has been around her 17 year old grandson who has recently been sick with a cough. She's been taking Nyquil which has helped temporarily for sleep, but her sinus pressure continues and has not improved.  Review of Systems  Constitutional: Positive for chills. Negative for fever.  HENT: Positive for congestion and sinus pressure. Negative for ear pain, rhinorrhea and sore throat.   Respiratory: Positive for cough. Negative for shortness of breath.   Cardiovascular: Negative for chest pain.  Musculoskeletal: Negative for myalgias.  Neurological: Positive for headaches.       Past Medical History  Diagnosis Date  . HTN (hypertension)   . Arthritis   . Anemia     History   Social History  . Marital Status: Married    Spouse Name: N/A  . Number of Children: N/A  . Years of Education: N/A   Occupational History  . Not on file.   Social History Main Topics  . Smoking status: Never Smoker   . Smokeless tobacco: Not on file  . Alcohol Use: Yes     Comment: occassionally  . Drug Use: No  . Sexual Activity: Not on file   Other Topics Concern  . Not on file   Social History Narrative   Married; 2 children, 1 at home.    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 Partial hysterectomy  1/06    fibroids; ovaries left in   . Cesarean section      x2  . Bone spur - removal    . Abdominal hysterectomy      Partial- Uterus only  . Tubal ligation    . 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 Depression      both sides of family   . Anemia Mother     Allergies  Allergen Reactions  . Simponi [Golimumab] Rash    Current Outpatient Prescriptions on File Prior to Visit  Medication Sig Dispense Refill  . ferrous sulfate 325 (65 FE) MG tablet Take 1 tablet (325 mg total) by mouth 3 (three) times daily after meals.  3  . fish oil-omega-3 fatty acids 1000 MG capsule Take 1 g by mouth as needed. Takes for joint pain    . lisinopril-hydrochlorothiazide (PRINZIDE,ZESTORETIC) 10-12.5 MG per tablet Take 1 tablet by mouth daily with breakfast. 90 tablet 3  . Multiple Vitamin (MULTIVITAMIN) tablet Take 1 tablet by mouth daily.    . simvastatin (ZOCOR) 10 MG tablet Take 1 tablet (10 mg total) by mouth at bedtime. 90 tablet 3  . traMADol (ULTRAM) 50 MG tablet Take 1 tablet (50 mg total) by mouth every 6 (six) hours as needed for moderate pain or severe pain. 60 tablet 0   No current facility-administered medications on file prior to visit.    BP 148/104  mmHg  Pulse 87  Temp(Src) 98.2 F (36.8 C) (Oral)  Ht 5\' 1"  (1.549 m)  Wt 166 lb 12.8 oz (75.66 kg)  BMI 31.53 kg/m2  SpO2 97%    Objective:   Physical Exam  Constitutional: She appears ill.  HENT:  Right Ear: Tympanic membrane and ear canal normal.  Left Ear: Tympanic membrane and ear canal normal.  Nose: Right sinus exhibits maxillary sinus tenderness and frontal sinus tenderness. Left sinus exhibits maxillary sinus tenderness and frontal sinus tenderness.  Mouth/Throat: Oropharynx is clear and moist.  Neck: Neck supple.  Cardiovascular: Normal rate and regular rhythm.   Pulmonary/Chest: Effort normal and breath sounds normal.  Lymphadenopathy:    She has no cervical adenopathy.  Skin: Skin is warm and dry.          Assessment & Plan:  Acute bacterial rhinosinusitis:  Sinus pressure and  cough x 2 weeks without improvement. Moderately tender to frontal and maxillary sinuses. RX for Augmentin BID for 10 days. Declines medication for cough as it isn't as bothersome. Push fluids, rest. Follow up if no improvement in 3-4 days.

## 2014-06-25 NOTE — Patient Instructions (Signed)
Start Augmentin antibiotics. Take 1 tablet by mouth twice daily for 10 days. You may use Fluticasone nasal spray for nasal congestion. Instill 2 sprays in each nostril once daily. It was nice meeting you! I hope you feel better soon!  Sinusitis Sinusitis is redness, soreness, and inflammation of the paranasal sinuses. Paranasal sinuses are air pockets within the bones of your face (beneath the eyes, the middle of the forehead, or above the eyes). In healthy paranasal sinuses, mucus is able to drain out, and air is able to circulate through them by way of your nose. However, when your paranasal sinuses are inflamed, mucus and air can become trapped. This can allow bacteria and other germs to grow and cause infection. Sinusitis can develop quickly and last only a short time (acute) or continue over a long period (chronic). Sinusitis that lasts for more than 12 weeks is considered chronic.  CAUSES  Causes of sinusitis include:  Allergies.  Structural abnormalities, such as displacement of the cartilage that separates your nostrils (deviated septum), which can decrease the air flow through your nose and sinuses and affect sinus drainage.  Functional abnormalities, such as when the small hairs (cilia) that line your sinuses and help remove mucus do not work properly or are not present. SIGNS AND SYMPTOMS  Symptoms of acute and chronic sinusitis are the same. The primary symptoms are pain and pressure around the affected sinuses. Other symptoms include:  Upper toothache.  Earache.  Headache.  Bad breath.  Decreased sense of smell and taste.  A cough, which worsens when you are lying flat.  Fatigue.  Fever.  Thick drainage from your nose, which often is green and may contain pus (purulent).  Swelling and warmth over the affected sinuses. DIAGNOSIS  Your health care provider will perform a physical exam. During the exam, your health care provider may:  Look in your nose for signs of  abnormal growths in your nostrils (nasal polyps).  Tap over the affected sinus to check for signs of infection.  View the inside of your sinuses (endoscopy) using an imaging device that has a light attached (endoscope). If your health care provider suspects that you have chronic sinusitis, one or more of the following tests may be recommended:  Allergy tests.  Nasal culture. A sample of mucus is taken from your nose, sent to a lab, and screened for bacteria.  Nasal cytology. A sample of mucus is taken from your nose and examined by your health care provider to determine if your sinusitis is related to an allergy. TREATMENT  Most cases of acute sinusitis are related to a viral infection and will resolve on their own within 10 days. Sometimes medicines are prescribed to help relieve symptoms (pain medicine, decongestants, nasal steroid sprays, or saline sprays).  However, for sinusitis related to a bacterial infection, your health care provider will prescribe antibiotic medicines. These are medicines that will help kill the bacteria causing the infection.  Rarely, sinusitis is caused by a fungal infection. In theses cases, your health care provider will prescribe antifungal medicine. For some cases of chronic sinusitis, surgery is needed. Generally, these are cases in which sinusitis recurs more than 3 times per year, despite other treatments. HOME CARE INSTRUCTIONS   Drink plenty of water. Water helps thin the mucus so your sinuses can drain more easily.  Use a humidifier.  Inhale steam 3 to 4 times a day (for example, sit in the bathroom with the shower running).  Apply a warm, moist  washcloth to your face 3 to 4 times a day, or as directed by your health care provider.  Use saline nasal sprays to help moisten and clean your sinuses.  Take medicines only as directed by your health care provider.  If you were prescribed either an antibiotic or antifungal medicine, finish it all even if  you start to feel better. SEEK IMMEDIATE MEDICAL CARE IF:  You have increasing pain or severe headaches.  You have nausea, vomiting, or drowsiness.  You have swelling around your face.  You have vision problems.  You have a stiff neck.  You have difficulty breathing. MAKE SURE YOU:   Understand these instructions.  Will watch your condition.  Will get help right away if you are not doing well or get worse. Document Released: 01/05/2005 Document Revised: 05/22/2013 Document Reviewed: 01/20/2011 [facility] Patient Information 2015 [place], [place]. This information is not intended to replace advice given to you by your health care provider. Make sure you discuss any questions you have with your health care provider.

## 2014-07-16 ENCOUNTER — Other Ambulatory Visit: Payer: Self-pay | Admitting: *Deleted

## 2014-07-16 MED ORDER — LISINOPRIL-HYDROCHLOROTHIAZIDE 10-12.5 MG PO TABS: 1.0000 | ORAL_TABLET | Freq: Every day | ORAL | Status: DC

## 2014-07-26 ENCOUNTER — Other Ambulatory Visit: Payer: Self-pay | Admitting: Family Medicine

## 2014-07-26 MED ORDER — TRAMADOL HCL 50 MG PO TABS: 50.0000 mg | ORAL_TABLET | Freq: Four times a day (QID) | ORAL | Status: DC | PRN

## 2014-07-26 NOTE — Telephone Encounter (Signed)
Last f/u appt 08/2013-CPE 

## 2014-07-26 NOTE — Telephone Encounter (Signed)
Rx called in to requested pharmacy 

## 2014-08-08 ENCOUNTER — Encounter: Payer: Self-pay | Admitting: Family Medicine

## 2014-08-08 ENCOUNTER — Other Ambulatory Visit: Payer: Self-pay | Admitting: Family Medicine

## 2014-08-08 MED ORDER — PREDNISONE 20 MG PO TABS: ORAL_TABLET | ORAL | Status: DC

## 2014-08-28 ENCOUNTER — Other Ambulatory Visit: Payer: Self-pay | Admitting: Family Medicine

## 2014-08-28 MED ORDER — TRAMADOL HCL 50 MG PO TABS: 50.0000 mg | ORAL_TABLET | Freq: Four times a day (QID) | ORAL | Status: DC | PRN

## 2014-08-28 NOTE — Telephone Encounter (Signed)
Last f/u appt 08/2013 

## 2014-08-28 NOTE — Telephone Encounter (Signed)
Rx called in to requested pharmacy 

## 2014-09-27 ENCOUNTER — Other Ambulatory Visit: Payer: Self-pay | Admitting: Family Medicine

## 2014-09-28 MED ORDER — TRAMADOL HCL 50 MG PO TABS: 50.0000 mg | ORAL_TABLET | Freq: Four times a day (QID) | ORAL | Status: DC | PRN

## 2014-10-02 ENCOUNTER — Other Ambulatory Visit: Payer: Self-pay | Admitting: Family Medicine

## 2014-10-03 ENCOUNTER — Encounter: Payer: Self-pay | Admitting: Family Medicine

## 2014-10-16 ENCOUNTER — Encounter: Payer: Self-pay | Admitting: Family Medicine

## 2014-10-16 ENCOUNTER — Other Ambulatory Visit: Payer: Self-pay | Admitting: Family Medicine

## 2014-10-16 ENCOUNTER — Ambulatory Visit (INDEPENDENT_AMBULATORY_CARE_PROVIDER_SITE_OTHER): Payer: BLUE CROSS/BLUE SHIELD | Admitting: Family Medicine

## 2014-10-16 VITALS — BP 120/78 | HR 70 | Temp 98.1°F | Wt 168.5 lb

## 2014-10-16 DIAGNOSIS — R059 Cough, unspecified: Secondary | ICD-10-CM

## 2014-10-16 DIAGNOSIS — H5789 Other specified disorders of eye and adnexa: Secondary | ICD-10-CM

## 2014-10-16 DIAGNOSIS — R05 Cough: Secondary | ICD-10-CM | POA: Diagnosis not present

## 2014-10-16 DIAGNOSIS — H578 Other specified disorders of eye and adnexa: Secondary | ICD-10-CM | POA: Diagnosis not present

## 2014-10-16 DIAGNOSIS — M069 Rheumatoid arthritis, unspecified: Secondary | ICD-10-CM | POA: Diagnosis not present

## 2014-10-16 LAB — CBC
HEMATOCRIT: 36.9 % (ref 36.0–46.0)
Hemoglobin: 11.5 g/dL — ABNORMAL LOW (ref 12.0–15.0)
MCHC: 31.1 g/dL (ref 30.0–36.0)
MCV: 73.8 fl — ABNORMAL LOW (ref 78.0–100.0)
Platelets: 315 10*3/uL (ref 150.0–400.0)
RBC: 5 Mil/uL (ref 3.87–5.11)
RDW: 15.1 % (ref 11.5–15.5)
WBC: 8.9 10*3/uL (ref 4.0–10.5)

## 2014-10-16 NOTE — Progress Notes (Signed)
Subjective:   [name], female    DOB: 1960-03-03, 54 y.o.   MRN: [id number]  Erin Bass is a pleasant 54 y.o. year old female who presents to clinic today with Hand Pain; Neck Pain; eye redness; and Bleeding/Bruising  on 10/16/2014  HPI: RA-   Unfortunately she has had a difficult course with her RA.  Was seeing Dr. Dareen Piano who left the practice.  Starting to have more symptoms and tried to schedule an appointment with another provider- not able to get an appointment until December.    Had previously taken symponi.  Now developing new symptoms and she is not sure if related to her RA.  Easy bruising, left eye erythema without pain, some photophobia.  Also has noticed a cough.      Allergies  Allergen Reactions  . Simponi [Golimumab] Rash    Past Medical History  Diagnosis Date  . HTN (hypertension)   . Arthritis   . Anemia     Past Surgical History  Procedure Laterality Date  . Partial hysterectomy  1/06    fibroids; ovaries left in   . Cesarean section      x2  . Bone spur - removal    . Abdominal hysterectomy      Partial- Uterus only  . Tubal ligation    . 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 Depression      both sides of family   . Anemia Mother     Social History   Social History  . Marital Status: Married    Spouse Name: N/A  . Number of Children: N/A  . Years of Education: N/A   Occupational History  . Not on file.   Social History Main Topics  . Smoking status: Never Smoker   . Smokeless tobacco: Not on file  . Alcohol Use: Yes     Comment: occassionally  . Drug Use: No  . Sexual Activity: Not on file   Other Topics Concern  . Not on file   Social History Narrative   Married; 2 children, 1 at home.    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   The PMH, PSH, Social History, Family History, Medications, and allergies have been reviewed in [facility], and have been updated if relevant.   Review of Systems  Constitutional: Negative.   HENT: Negative.   Eyes: Positive for photophobia and redness. Negative for pain and visual disturbance.  Respiratory: Positive for cough.   Cardiovascular: Negative.   Gastrointestinal: Negative.   Endocrine: Negative.   Genitourinary: Negative.   Musculoskeletal: Positive for joint swelling and arthralgias.  Skin: Negative.   Allergic/Immunologic: Negative.   Neurological: Negative.   Hematological: Negative.   Psychiatric/Behavioral: Negative.   All other systems reviewed and are negative.      Objective:    BP 120/78 mmHg  Pulse 70  Temp(Src) 98.1 F (36.7 C) (Oral)  Wt 168 lb 8 oz (76.431 kg)  SpO2 98%   Physical Exam  Constitutional: She is oriented to person, place, and time. She appears well-developed and well-nourished. No distress.  HENT:  Head: Normocephalic and atraumatic.  Eyes: EOM and lids are normal. Pupils are equal, round, and reactive to light. Right conjunctiva is injected.  Neck: Normal range of motion.  Cardiovascular: Normal rate.   Pulmonary/Chest: Effort normal.  Musculoskeletal: She exhibits edema.       Right wrist: She exhibits decreased range of motion, tenderness, swelling and deformity.  Neurological: She is alert and oriented to person, place, and time. No cranial nerve deficit.  Skin: Skin is warm and dry.  Psychiatric: She has a normal mood and affect. Her behavior is normal. Judgment and thought content normal.  Nursing note and vitals reviewed.         Assessment & Plan:   Rheumatoid arthritis - Plan: Ambulatory referral to Rheumatology  Redness of left eye - Plan: Ambulatory referral to Ophthalmology  Cough - Plan: CBC, Angiotensin converting enzyme No Follow-up on file.

## 2014-10-16 NOTE — Patient Instructions (Signed)
Good to see you. Please stop by to see Erin Bass on your way out. We will call you with your lab results.   

## 2014-10-16 NOTE — Progress Notes (Signed)
Pre visit review using our clinic review tool, if applicable. No additional management support is needed unless otherwise documented below in the visit note. 

## 2014-10-16 NOTE — Assessment & Plan Note (Signed)
Deteriorated. Not taking rx currently and needs to restart ASAP. Refer to rheum to see if we can get an earlier appointment. The patient indicates understanding of these issues and agrees with the plan.

## 2014-10-16 NOTE — Assessment & Plan Note (Signed)
Needs optho evaluation- referral placed.

## 2014-10-16 NOTE — Assessment & Plan Note (Signed)
New- she would like to defer CXR. ?sarcoidosis with constellation of other symptoms. Check ACE and CBC today.

## 2014-10-17 LAB — ANGIOTENSIN CONVERTING ENZYME: Angiotensin-Converting Enzyme: 7 U/L — ABNORMAL LOW (ref 8–52)

## 2014-11-07 ENCOUNTER — Other Ambulatory Visit: Payer: Self-pay | Admitting: Family Medicine

## 2014-11-07 MED ORDER — TRAMADOL HCL 50 MG PO TABS: 50.0000 mg | ORAL_TABLET | Freq: Four times a day (QID) | ORAL | Status: DC | PRN

## 2014-11-07 NOTE — Telephone Encounter (Signed)
Last f/u 09/2014 

## 2014-11-07 NOTE — Telephone Encounter (Signed)
Rx called in to requested pharmacy 

## 2014-12-13 ENCOUNTER — Other Ambulatory Visit: Payer: Self-pay | Admitting: Family Medicine

## 2014-12-15 NOTE — Telephone Encounter (Signed)
Last office visit 10/16/2014.  Last refilled 11/07/2014 for #60 with no refills.  Ok to refill?

## 2014-12-17 MED ORDER — TRAMADOL HCL 50 MG PO TABS: 50.0000 mg | ORAL_TABLET | Freq: Four times a day (QID) | ORAL | Status: DC | PRN

## 2014-12-17 NOTE — Telephone Encounter (Signed)
Rx called in to requested pharmacy 

## 2015-01-22 ENCOUNTER — Other Ambulatory Visit: Payer: Self-pay | Admitting: Family Medicine

## 2015-01-22 MED ORDER — LISINOPRIL-HYDROCHLOROTHIAZIDE 10-12.5 MG PO TABS: 1.0000 | ORAL_TABLET | Freq: Every day | ORAL | Status: DC

## 2015-01-22 MED ORDER — TRAMADOL HCL 50 MG PO TABS: 50.0000 mg | ORAL_TABLET | Freq: Four times a day (QID) | ORAL | Status: DC | PRN

## 2015-01-22 NOTE — Telephone Encounter (Signed)
Rx called in to requested pharmacy 

## 2015-01-22 NOTE — Telephone Encounter (Signed)
Last f/u 09/2014 

## 2015-03-04 ENCOUNTER — Other Ambulatory Visit: Payer: Self-pay | Admitting: Family Medicine

## 2015-03-04 NOTE — Telephone Encounter (Signed)
Received refill request electronically Last refill 01/22/15 #60 Last office visit 10/16/14 Is it okay to refill?

## 2015-03-04 NOTE — Telephone Encounter (Signed)
Rx called to pharmacy as instructed. 

## 2015-04-11 ENCOUNTER — Other Ambulatory Visit: Payer: Self-pay | Admitting: Family Medicine

## 2015-04-11 MED ORDER — TRAMADOL HCL 50 MG PO TABS: 50.0000 mg | ORAL_TABLET | Freq: Four times a day (QID) | ORAL | Status: DC

## 2015-04-11 NOTE — Telephone Encounter (Signed)
Rx called in to requested pharmacy 

## 2015-04-11 NOTE — Telephone Encounter (Signed)
Last f/u 09/2014 

## 2015-05-02 DIAGNOSIS — Z79899 Other long term (current) drug therapy: Secondary | ICD-10-CM | POA: Diagnosis not present

## 2015-05-02 DIAGNOSIS — M0579 Rheumatoid arthritis with rheumatoid factor of multiple sites without organ or systems involvement: Secondary | ICD-10-CM | POA: Diagnosis not present

## 2015-05-06 ENCOUNTER — Other Ambulatory Visit: Payer: Self-pay | Admitting: Family Medicine

## 2015-05-06 NOTE — Telephone Encounter (Signed)
Last office visit 10/16/2014.  Last refill states needs office visit.  Tramadol last refilled 04/11/2015 for #60 with no refills. No future appointments scheduled.  Refill?

## 2015-05-06 NOTE — Telephone Encounter (Addendum)
Tramadol called into [facility] on [address].

## 2015-05-14 ENCOUNTER — Ambulatory Visit (INDEPENDENT_AMBULATORY_CARE_PROVIDER_SITE_OTHER): Payer: BLUE CROSS/BLUE SHIELD | Admitting: Family Medicine

## 2015-05-14 DIAGNOSIS — M05742 Rheumatoid arthritis with rheumatoid factor of left hand without organ or systems involvement: Secondary | ICD-10-CM

## 2015-05-14 DIAGNOSIS — I10 Essential (primary) hypertension: Secondary | ICD-10-CM

## 2015-05-14 DIAGNOSIS — M05741 Rheumatoid arthritis with rheumatoid factor of right hand without organ or systems involvement: Secondary | ICD-10-CM

## 2015-05-14 DIAGNOSIS — E785 Hyperlipidemia, unspecified: Secondary | ICD-10-CM

## 2015-05-14 NOTE — Progress Notes (Deleted)
Subjective:   [name], female    DOB: [birth year], 55 y.o.   MRN: [id number]  Erin Bass is a pleasant 55 y.o. year old female who presents to clinic today with No chief complaint on file.  on 05/14/2015  HPI:  RA-   HTN- currently taking Prinzide 10-12.5 mg daily. BP has been well controlled.  Denies any HA, blurred vision, CP or SOB.  No LE edema. Lab Results  Component Value Date   CREATININE 0.89 02/28/2014   HLD- taking zocor 10 mg daily. Lab Results  Component Value Date   CHOL 258* 02/28/2014   HDL 86.50 02/28/2014   LDLCALC 161* 02/28/2014   LDLDIRECT 142.9 12/02/2012   TRIG 51.0 02/28/2014   CHOLHDL 3 02/28/2014   Lab Results  Component Value Date   ALT 11 02/28/2014   AST 15 02/28/2014   ALKPHOS 63 02/28/2014   BILITOT 0.4 02/28/2014   Current Outpatient Prescriptions on File Prior to Visit  Medication Sig Dispense Refill  . ferrous sulfate 325 (65 FE) MG tablet Take 1 tablet (325 mg total) by mouth 3 (three) times daily after meals.  3  . fish oil-omega-3 fatty acids 1000 MG capsule Take 1 g by mouth as needed. Takes for joint pain    . lisinopril-hydrochlorothiazide (PRINZIDE,ZESTORETIC) 10-12.5 MG tablet TAKE ONE TABLET BY MOUTH ONCE DAILY 90 tablet 0  . Multiple Vitamin (MULTIVITAMIN) tablet Take 1 tablet by mouth daily.    . simvastatin (ZOCOR) 10 MG tablet Take 1 tablet (10 mg total) by mouth at bedtime. 90 tablet 3  . traMADol (ULTRAM) 50 MG tablet TAKE ONE TABLET BY MOUTH EVERY 6 HOURS 60 tablet 0   No current facility-administered medications on file prior to visit.    Allergies  Allergen Reactions  . Simponi [Golimumab] Rash    Past Medical History  Diagnosis Date  . HTN (hypertension)   . Arthritis   . Anemia     Past Surgical History  Procedure Laterality Date  . Partial hysterectomy  1/06    fibroids; ovaries left in   . Cesarean section      x2  . Bone spur - removal    . Abdominal hysterectomy     Partial- Uterus only  . Tubal ligation    . 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 Depression      both sides of family   . Anemia Mother     Social History   Social History  . Marital Status: Married    Spouse Name: N/A  . Number of Children: N/A  . Years of Education: N/A   Occupational History  . Not on file.   Social History Main Topics  . Smoking status: Never Smoker   . Smokeless tobacco: Not on file  . Alcohol Use: Yes     Comment: occassionally  . Drug Use: No  . Sexual Activity: Not on file   Other Topics Concern  . Not on file   Social History Narrative   Married; 2 children, 1 at home.    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   Review of Systems     Objective:    There were no vitals taken for this visit.   Physical Exam        Assessment & Plan:   Essential hypertension  Rheumatoid arthritis involving both hands with positive rheumatoid factor (HCC)  HLD (hyperlipidemia) No Follow-up on file.

## 2015-05-14 NOTE — Progress Notes (Signed)
appt cancelled

## 2015-05-29 ENCOUNTER — Ambulatory Visit (INDEPENDENT_AMBULATORY_CARE_PROVIDER_SITE_OTHER): Payer: BLUE CROSS/BLUE SHIELD | Admitting: Family Medicine

## 2015-05-29 ENCOUNTER — Encounter: Payer: Self-pay | Admitting: Family Medicine

## 2015-05-29 ENCOUNTER — Encounter: Payer: Self-pay | Admitting: *Deleted

## 2015-05-29 VITALS — BP 130/78 | HR 70 | Temp 98.2°F | Ht 61.25 in | Wt 173.8 lb

## 2015-05-29 DIAGNOSIS — M05742 Rheumatoid arthritis with rheumatoid factor of left hand without organ or systems involvement: Secondary | ICD-10-CM

## 2015-05-29 DIAGNOSIS — M05741 Rheumatoid arthritis with rheumatoid factor of right hand without organ or systems involvement: Secondary | ICD-10-CM

## 2015-05-29 DIAGNOSIS — E785 Hyperlipidemia, unspecified: Secondary | ICD-10-CM

## 2015-05-29 DIAGNOSIS — D509 Iron deficiency anemia, unspecified: Secondary | ICD-10-CM | POA: Diagnosis not present

## 2015-05-29 DIAGNOSIS — I10 Essential (primary) hypertension: Secondary | ICD-10-CM | POA: Diagnosis not present

## 2015-05-29 DIAGNOSIS — Z0000 Encounter for general adult medical examination without abnormal findings: Secondary | ICD-10-CM

## 2015-05-29 DIAGNOSIS — Z01419 Encounter for gynecological examination (general) (routine) without abnormal findings: Secondary | ICD-10-CM

## 2015-05-29 LAB — LIPID PANEL
CHOL/HDL RATIO: 3
Cholesterol: 252 mg/dL — ABNORMAL HIGH (ref 0–200)
HDL: 98.2 mg/dL (ref >39.00)
LDL CALC: 139 mg/dL — AB (ref 0–99)
NonHDL: 153.77
TRIGLYCERIDES: 72 mg/dL (ref 0.0–149.0)
VLDL: 14.4 mg/dL (ref 0.0–40.0)

## 2015-05-29 LAB — VITAMIN B12: Vitamin B-12: 1160 pg/mL — ABNORMAL HIGH (ref 211–911)

## 2015-05-29 LAB — VITAMIN D 25 HYDROXY (VIT D DEFICIENCY, FRACTURES): VITD: 25.78 ng/mL — AB (ref 30.00–100.00)

## 2015-05-29 LAB — TSH: TSH: 2.1 u[IU]/mL (ref 0.35–4.50)

## 2015-05-29 NOTE — Progress Notes (Signed)
Pre visit review using our clinic review tool, if applicable. No additional management support is needed unless otherwise documented below in the visit note. 

## 2015-05-29 NOTE — Assessment & Plan Note (Signed)
Reviewed preventive care protocols, scheduled due services, and updated immunizations Discussed nutrition, exercise, diet, and healthy lifestyle.  

## 2015-05-29 NOTE — Assessment & Plan Note (Signed)
Well controlled. No changes made to rxs today. 

## 2015-05-29 NOTE — Assessment & Plan Note (Signed)
Deteriorated. ? [facility] as an option. Has follow up with RA coming.

## 2015-05-29 NOTE — Progress Notes (Signed)
Subjective:   [name], female    DOB: 03-Apr-1960, 55 y.o.   MRN: [id number]  Erin Bass is a pleasant 55 y.o. year old female who presents to clinic today with Annual Exam  and follow up of chronic medical conditions on 05/29/2015  HPI:  Remote h/o hysterectomy Colonoscopy 08/31/13- Dr. Caryl Never Mammogram 08/22/13 Eye exam 10/01/14  HTN- has been well controlled on current dose of Prinzide. Denies HA, blurred vision, CP or SOB. Lab Results  Component Value Date   CREATININE 0.89 02/28/2014    HLD- Stopped taking zocor 10 mg daily.  Overdue for labs. Lab Results  Component Value Date   CHOL 258* 02/28/2014   HDL 86.50 02/28/2014   LDLCALC 161* 02/28/2014   LDLDIRECT 142.9 12/02/2012   TRIG 51.0 02/28/2014   CHOLHDL 3 02/28/2014   Lab Results  Component Value Date   ALT 11 02/28/2014   AST 15 02/28/2014   ALKPHOS 63 02/28/2014   BILITOT 0.4 02/28/2014   RA- now being followed by Dr. Herma Carson at [facility] rheumatology. Was last seen on 11/20/14- note reviewed.  MTX, folic acid and prednisone restarted at that time. It was then stopped because of elevated liver function. Now only taking low dose prednisone and tramadol as needed.  She has been in a lot of pain.  Has an appointment to see rheumatology in a few weeks.  Current Outpatient Prescriptions on File Prior to Visit  Medication Sig Dispense Refill  . ferrous sulfate 325 (65 FE) MG tablet Take 1 tablet (325 mg total) by mouth 3 (three) times daily after meals.  3  . fish oil-omega-3 fatty acids 1000 MG capsule Take 1 g by mouth as needed. Takes for joint pain    . lisinopril-hydrochlorothiazide (PRINZIDE,ZESTORETIC) 10-12.5 MG tablet TAKE ONE TABLET BY MOUTH ONCE DAILY 90 tablet 0  . Multiple Vitamin (MULTIVITAMIN) tablet Take 1 tablet by mouth daily.    . simvastatin (ZOCOR) 10 MG tablet Take 1 tablet (10 mg total) by mouth at bedtime. 90 tablet 3  . traMADol (ULTRAM) 50 MG tablet TAKE ONE TABLET BY MOUTH  EVERY 6 HOURS 60 tablet 0   No current facility-administered medications on file prior to visit.    Allergies  Allergen Reactions  . Simponi [Golimumab] Rash    Past Medical History  Diagnosis Date  . HTN (hypertension)   . Arthritis   . Anemia     Past Surgical History  Procedure Laterality Date  . Partial hysterectomy  1/06    fibroids; ovaries left in   . Cesarean section      x2  . Bone spur - removal    . Abdominal hysterectomy      Partial- Uterus only  . Tubal ligation    . 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 Depression      both sides of family   . Anemia Mother     Social History   Social History  . Marital Status: Married    Spouse Name: N/A  . Number of Children: N/A  . Years of Education: N/A   Occupational History  . Not on file.   Social History Main Topics  . Smoking status: Never Smoker   . Smokeless tobacco: Not on file  .  Alcohol Use: Yes     Comment: occassionally  . Drug Use: No  . Sexual Activity: Not on file   Other Topics Concern  . Not on file   Social History Narrative   Married; 2 children, 1 at home.    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   Review of Systems  Constitutional: Negative.   HENT: Negative.   Eyes: Negative.   Respiratory: Negative.   Cardiovascular: Negative.   Gastrointestinal: Negative.   Endocrine: Negative.   Genitourinary: Negative.   Musculoskeletal: Positive for arthralgias.  Skin: Negative.   Allergic/Immunologic: Negative.   Neurological: Negative.   Hematological: Negative.   Psychiatric/Behavioral: Negative.   All other systems  reviewed and are negative.      Objective:    BP 130/78 mmHg  Pulse 70  Temp(Src) 98.2 F (36.8 C) (Oral)  Ht 5' 1.25" (1.556 m)  Wt 173 lb 12 oz (78.812 kg)  BMI 32.55 kg/m2  SpO2 98%   Physical Exam    General:  Well-developed,well-nourished,in no acute distress; alert,appropriate and cooperative throughout examination Head:  normocephalic and atraumatic.   Eyes:  vision grossly intact, pupils equal, pupils round, and pupils reactive to light.   Ears:  R ear normal and L ear normal.   Nose:  no external deformity.   Mouth:  good dentition.   Neck:  No deformities, masses, or tenderness noted. Breasts:  No mass, nodules, thickening, tenderness, bulging, retraction, inflamation, nipple discharge or skin changes noted.   Lungs:  Normal respiratory effort, chest expands symmetrically. Lungs are clear to auscultation, no crackles or wheezes. Heart:  Normal rate and regular rhythm. S1 and S2 normal without gallop, murmur, click, rub or other extra sounds. Abdomen:  Bowel sounds positive,abdomen soft and non-tender without masses, organomegaly or hernias noted. Msk:  No deformity or scoliosis noted of thoracic or lumbar spine +swan deformity in hands bilaterally.   Extremities:  No clubbing, cyanosis, edema, or deformity noted with normal full range of motion of all joints.   Neurologic:  alert & oriented X3 and gait normal.   Skin:  Intact without suspicious lesions or rashes Cervical Nodes:  No lymphadenopathy noted Axillary Nodes:  No palpable lymphadenopathy Psych:  Cognition and judgment appear intact. Alert and cooperative with normal attention span and concentration. No apparent delusions, illusions, hallucinations      Assessment & Plan:   Well woman exam  Rheumatoid arthritis involving both hands with positive rheumatoid factor (HCC)  Essential hypertension  HLD (hyperlipidemia)  Microcytic anemia No Follow-up on file.

## 2015-05-29 NOTE — Addendum Note (Signed)
Addended by: Liane Comber C on: 05/29/2015 11:40 AM   Modules accepted: SmartSet

## 2015-05-29 NOTE — Patient Instructions (Signed)
Good to see you. We will call you with your results from today.  Keep me updated with what your rheumatologist decides to do next.

## 2015-05-29 NOTE — Assessment & Plan Note (Signed)
Stopped taking simvastatin due to mylagias. Check labs today.

## 2015-06-13 ENCOUNTER — Other Ambulatory Visit: Payer: Self-pay | Admitting: Family Medicine

## 2015-06-13 MED ORDER — TRAMADOL HCL 50 MG PO TABS: 50.0000 mg | ORAL_TABLET | Freq: Four times a day (QID) | ORAL | Status: DC

## 2015-06-13 NOTE — Telephone Encounter (Signed)
Last f/u 05/2015-CPE 

## 2015-06-13 NOTE — Telephone Encounter (Signed)
Rx called in to requested pharmacy 

## 2015-06-28 DIAGNOSIS — M0579 Rheumatoid arthritis with rheumatoid factor of multiple sites without organ or systems involvement: Secondary | ICD-10-CM | POA: Diagnosis not present

## 2015-06-28 DIAGNOSIS — R748 Abnormal levels of other serum enzymes: Secondary | ICD-10-CM | POA: Diagnosis not present

## 2015-06-28 DIAGNOSIS — Z79899 Other long term (current) drug therapy: Secondary | ICD-10-CM | POA: Diagnosis not present

## 2015-07-15 ENCOUNTER — Other Ambulatory Visit: Payer: Self-pay | Admitting: Family Medicine

## 2015-07-15 MED ORDER — TRAMADOL HCL 50 MG PO TABS: 50.0000 mg | ORAL_TABLET | Freq: Four times a day (QID) | ORAL | Status: DC

## 2015-07-15 NOTE — Telephone Encounter (Signed)
rx called in to requested pharmacy 

## 2015-07-15 NOTE — Telephone Encounter (Signed)
Last f/u 05/2015-CPE 

## 2015-08-09 DIAGNOSIS — Z79899 Other long term (current) drug therapy: Secondary | ICD-10-CM | POA: Diagnosis not present

## 2015-08-13 ENCOUNTER — Other Ambulatory Visit: Payer: Self-pay | Admitting: *Deleted

## 2015-08-13 MED ORDER — LISINOPRIL-HYDROCHLOROTHIAZIDE 10-12.5 MG PO TABS: 1.0000 | ORAL_TABLET | Freq: Every day | ORAL | 2 refills | Status: DC

## 2015-08-21 ENCOUNTER — Other Ambulatory Visit: Payer: Self-pay | Admitting: Family Medicine

## 2015-08-21 MED ORDER — TRAMADOL HCL 50 MG PO TABS: 50.0000 mg | ORAL_TABLET | Freq: Four times a day (QID) | ORAL | 0 refills | Status: DC

## 2015-08-21 NOTE — Telephone Encounter (Signed)
Last f/u 05/2015-CPE 

## 2015-08-21 NOTE — Telephone Encounter (Signed)
Rx called in to requested pharmacy 

## 2015-09-23 ENCOUNTER — Other Ambulatory Visit: Payer: Self-pay | Admitting: Family Medicine

## 2015-09-24 MED ORDER — TRAMADOL HCL 50 MG PO TABS: 50.0000 mg | ORAL_TABLET | Freq: Four times a day (QID) | ORAL | 0 refills | Status: DC

## 2015-09-24 NOTE — Telephone Encounter (Signed)
Last f/u 05/2015-CPE 

## 2015-09-24 NOTE — Telephone Encounter (Signed)
Rx called in to requested pharmacy 

## 2015-10-11 DIAGNOSIS — R748 Abnormal levels of other serum enzymes: Secondary | ICD-10-CM | POA: Diagnosis not present

## 2015-10-11 DIAGNOSIS — Z79899 Other long term (current) drug therapy: Secondary | ICD-10-CM | POA: Diagnosis not present

## 2015-10-11 DIAGNOSIS — M0579 Rheumatoid arthritis with rheumatoid factor of multiple sites without organ or systems involvement: Secondary | ICD-10-CM | POA: Diagnosis not present

## 2015-10-23 ENCOUNTER — Other Ambulatory Visit: Payer: Self-pay | Admitting: Family Medicine

## 2015-10-24 MED ORDER — TRAMADOL HCL 50 MG PO TABS: 50.0000 mg | ORAL_TABLET | Freq: Four times a day (QID) | ORAL | 0 refills | Status: DC

## 2015-10-24 NOTE — Telephone Encounter (Signed)
Rx called in to requested pharmacy 

## 2015-10-24 NOTE — Telephone Encounter (Signed)
Last f/u 05/2015-CPE 

## 2015-11-12 ENCOUNTER — Ambulatory Visit (INDEPENDENT_AMBULATORY_CARE_PROVIDER_SITE_OTHER): Payer: BLUE CROSS/BLUE SHIELD | Admitting: Family Medicine

## 2015-11-12 ENCOUNTER — Encounter: Payer: Self-pay | Admitting: Family Medicine

## 2015-11-12 VITALS — BP 136/88 | HR 79 | Temp 98.1°F | Wt 179.2 lb

## 2015-11-12 DIAGNOSIS — M05741 Rheumatoid arthritis with rheumatoid factor of right hand without organ or systems involvement: Secondary | ICD-10-CM | POA: Diagnosis not present

## 2015-11-12 DIAGNOSIS — M05742 Rheumatoid arthritis with rheumatoid factor of left hand without organ or systems involvement: Secondary | ICD-10-CM | POA: Diagnosis not present

## 2015-11-12 MED ORDER — DEXAMETHASONE SODIUM PHOSPHATE 10 MG/ML IJ SOLN
10.0000 mg | Freq: Once | INTRAMUSCULAR | Status: AC
  Administered 2015-11-12: 10 mg via INTRAMUSCULAR

## 2015-11-12 NOTE — Assessment & Plan Note (Signed)
Deteriorated. Followed by rheum but feels she cannot tolerate any rx and current rxs not helpful. She would like to consider getting cortisone injection in her knees.  She will make an appt with Dr. Patsy Lager.  Given IM decadron in office to help with acute inflammation. Continue Tramadol The patient indicates understanding of these issues and agrees with the plan.

## 2015-11-12 NOTE — Addendum Note (Signed)
Addended by: Desmond Dike on: 11/12/2015 08:02 AM   Modules accepted: Orders

## 2015-11-12 NOTE — Patient Instructions (Signed)
Great to see you.  Please make an appointment to see Dr. Patsy Lager on your way out.

## 2015-11-12 NOTE — Progress Notes (Signed)
Pre visit review using our clinic review tool, if applicable. No additional management support is needed unless otherwise documented below in the visit note. 

## 2015-11-12 NOTE — Progress Notes (Signed)
Subjective:   [name], female    DOB: 1960-10-08, 55 y.o.   MRN: [id number]  Erin Bass is a pleasant 55 y.o. year old female who presents to clinic today with Knee Pain (Hx of arthritis) on 11/12/2015   RA- now being followed by Dr. Herma Carson at [facility] rheumatology. Was last seen on 10/11/15- note reviewed.  MTX stopped because of bruising and elevated LFTs.  At that OV, advised prednisone and Arava.  Also had to stop simponi due to surgical wound infection. She feels Erin Bass is not helping at all.    She has stopped taking prednisone.    Currently only taking Tramadol and Tylenol.  Has worsening knee pain now.  Thinks she also injured her knee when she was helping to lift her father.  RIght knee is bothering her more than the left.   Current Outpatient Prescriptions on File Prior to Visit  Medication Sig Dispense Refill  . ferrous sulfate 325 (65 FE) MG tablet Take 1 tablet (325 mg total) by mouth 3 (three) times daily after meals.  3  . fish oil-omega-3 fatty acids 1000 MG capsule Take 1 g by mouth as needed. Takes for joint pain    . lisinopril-hydrochlorothiazide (PRINZIDE,ZESTORETIC) 10-12.5 MG tablet Take 1 tablet by mouth daily. 90 tablet 2  . Multiple Vitamin (MULTIVITAMIN) tablet Take 1 tablet by mouth daily.    . traMADol (ULTRAM) 50 MG tablet Take 1 tablet (50 mg total) by mouth every 6 (six) hours. 60 tablet 0  . simvastatin (ZOCOR) 10 MG tablet Take 1 tablet (10 mg total) by mouth at bedtime. (Patient not taking: Reported on 11/12/2015) 90 tablet 3   No current facility-administered medications on file prior to visit.     Allergies  Allergen Reactions  . Simponi [Golimumab] Rash    Past Medical History:  Diagnosis Date  . Anemia   . Arthritis   . HTN (hypertension)     Past Surgical History:  Procedure Laterality Date  . ABDOMINAL HYSTERECTOMY     Partial- Uterus only  . bone spur - removal    . CESAREAN SECTION     x2  . PARTIAL  HYSTERECTOMY  1/06   fibroids; ovaries left in   . TOTAL HIP ARTHROPLASTY Right 12/20/2012   Procedure: RIGHT TOTAL HIP ARTHROPLASTY ANTERIOR APPROACH;  Surgeon: Shelda Pal, MD;  Location: [facility];  Service: Orthopedics;  Laterality: Right;  . TUBAL LIGATION      Family History  Problem Relation Age of Onset  . Depression      both sides of family   . Anemia Mother     Social History   Social History  . Marital status: Married    Spouse name: N/A  . Number of children: N/A  . Years of education: N/A   Occupational History  . Not on file.   Social History Main Topics  . Smoking status: Never Smoker  . Smokeless tobacco: Not on file  . Alcohol use Yes     Comment: occassionally  . Drug use: No  . Sexual activity: Not on file   Other Topics Concern  . Not on file   Social History Narrative   Married; 2 children, 1 at home.    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   The PMH, PSH, Social History, Family History, Medications, and allergies have been reviewed in [facility], and have been updated if relevant.   Review of  Systems  Constitutional: Negative.   HENT: Negative.   Eyes: Negative.   Respiratory: Negative.   Cardiovascular: Negative.   Gastrointestinal: Negative.   Endocrine: Negative.   Genitourinary: Negative.   Musculoskeletal: Positive for arthralgias.  Skin: Negative.   Allergic/Immunologic: Negative.   Neurological: Negative.   Hematological: Negative.   Psychiatric/Behavioral: Negative.   All other systems reviewed and are negative.      Objective:    BP 136/88   Pulse 79   Temp 98.1 F (36.7 C) (Oral)   Wt 179 lb 4 oz (81.3 kg)   SpO2 98%   BMI 33.59 kg/m    Physical Exam    General:  Well-developed,well-nourished,in no acute distress; alert,appropriate and cooperative throughout examination Head:  normocephalic and atraumatic.   Eyes:  vision grossly intact, pupils equal, pupils round, and pupils reactive to light.     Ears:  R ear normal and L ear normal.   Nose:  no external deformity.   Mouth:  good dentition.   Neck:  No deformities, masses, or tenderness noted. Breasts:  No mass, nodules, thickening, tenderness, bulging, retraction, inflamation, nipple discharge or skin changes noted.   Lungs:  Normal respiratory effort, chest expands symmetrically. Lungs are clear to auscultation, no crackles or wheezes. Heart:  Normal rate and regular rhythm. S1 and S2 normal without gallop, murmur, click, rub or other extra sounds. Abdomen:  Bowel sounds positive,abdomen soft and non-tender without masses, organomegaly or hernias noted. Msk:  No deformity or scoliosis noted of thoracic or lumbar spine +swan deformity in hands bilaterally.   Using a cane, limping, knee swollen Extremities:  No clubbing, cyanosis, edema, or deformity noted with normal full range of motion of all joints.   Neurologic:  alert & oriented X3 and gait normal.   Skin:  Intact without suspicious lesions or rashes Psych:  Cognition and judgment appear intact. Alert and cooperative with normal attention span and concentration. No apparent delusions, illusions, hallucinations      Assessment & Plan:   Rheumatoid arthritis involving both hands with positive rheumatoid factor (HCC) No Follow-up on file.

## 2015-11-18 ENCOUNTER — Other Ambulatory Visit: Payer: Self-pay | Admitting: Family Medicine

## 2015-11-18 MED ORDER — TRAMADOL HCL 50 MG PO TABS: 50.0000 mg | ORAL_TABLET | Freq: Four times a day (QID) | ORAL | 0 refills | Status: DC

## 2015-11-18 NOTE — Telephone Encounter (Signed)
Last f/u 10/2015 

## 2015-11-18 NOTE — Telephone Encounter (Signed)
Rx called in to requested pharmacy 

## 2015-11-25 ENCOUNTER — Ambulatory Visit (INDEPENDENT_AMBULATORY_CARE_PROVIDER_SITE_OTHER): Payer: BLUE CROSS/BLUE SHIELD | Admitting: Family Medicine

## 2015-11-25 ENCOUNTER — Encounter: Payer: Self-pay | Admitting: Family Medicine

## 2015-11-25 VITALS — BP 122/88 | HR 85 | Temp 98.3°F | Ht 61.25 in | Wt 179.5 lb

## 2015-11-25 DIAGNOSIS — M05762 Rheumatoid arthritis with rheumatoid factor of left knee without organ or systems involvement: Secondary | ICD-10-CM | POA: Diagnosis not present

## 2015-11-25 DIAGNOSIS — M05761 Rheumatoid arthritis with rheumatoid factor of right knee without organ or systems involvement: Secondary | ICD-10-CM | POA: Diagnosis not present

## 2015-11-25 MED ORDER — METHYLPREDNISOLONE ACETATE 40 MG/ML IJ SUSP
80.0000 mg | Freq: Once | INTRAMUSCULAR | Status: AC
  Administered 2015-11-25: 80 mg via INTRA_ARTICULAR

## 2015-11-25 NOTE — Patient Instructions (Addendum)
Take Tylenol/Acetaminophen ES (500mg ) 2 tabs by mouth three times a day max as needed.   Motrin 600 - 800 mg recommended TID. (Over the counter Motrin, Advil, or Generic Ibuprofen 200 mg tablets. 3-4 tablets by mouth 3 times a day. This equals a prescription strength dose.)   After you have an injection of any joint, the numbing medicine will make it feel better for a few hours. Later tonight, it is common for the joint to feel worse. The steroid will take 48-72 hours to start working - it is the thing that will likely provide the most relief.  Ice the joint where you had the injection at least 2-3 times a day for 20 minutes for 3 days. If have had swelling, pain in the joint itself, ice for 1 week.  You can use an ice bag, frozen peas or corn, an ice pack - all work

## 2015-11-25 NOTE — Progress Notes (Signed)
Pre visit review using our clinic review tool, if applicable. No additional management support is needed unless otherwise documented below in the visit note. 

## 2015-11-25 NOTE — Progress Notes (Signed)
Dr. Karleen Hampshire T. Sangeeta Youse, MD, CAQ Sports Medicine [facility] [address] [place], [address] Phone: [phone] Fax: [phone]  11/25/2015  Patient: Erin Bass, MRN: [id number], DOB: 09-26-1960, 55 y.o.  Primary Physician:  Ruthe Mannan, MD   Chief Complaint  Patient presents with  . Knee Pain    Bilateral x 2 to 3 weeks   Subjective:   Erin Bass is a 55 y.o. very pleasant female patient who presents with the following:  RA, b knee arthritis. The patient is under the care of a rheumatologist in [facility], but she recently saw my partner and was having some ongoing knee pain and swelling. She does have a known history of rheumatoid arthritis. She has had no trauma or injury.  She has had some difficulty with DMARD's, including methotrexate as well as Simponi, with infections and liver abnormalities.  She tells me that she is currently not taking methotrexate. She is somewhat frustrated.  Past Medical History, Surgical History, Social History, Family History, Problem List, Medications, and Allergies have been reviewed and updated if relevant.  Patient Active Problem List   Diagnosis Date Noted  . Overweight (BMI 25.0-29.9) 12/21/2012  . 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 Rheumatoid arthritis (HCC) 10/22/2011  . Microcytic anemia 05/28/2011  . POSTMENOPAUSAL STATUS 03/28/2009  . HLD (hyperlipidemia) 01/24/2008  . Essential hypertension 09/06/2006    Past Medical History:  Diagnosis Date  . Anemia   . Arthritis   . HTN (hypertension)     Past Surgical History:  Procedure Laterality Date  . ABDOMINAL HYSTERECTOMY     Partial- Uterus only  . bone spur - removal    . CESAREAN SECTION     x2  . PARTIAL HYSTERECTOMY  1/06   fibroids; ovaries left in   . TOTAL HIP ARTHROPLASTY Right 12/20/2012   Procedure: RIGHT TOTAL HIP ARTHROPLASTY ANTERIOR APPROACH;  Surgeon: Shelda Pal, MD;   Location: [facility];  Service: Orthopedics;  Laterality: Right;  . TUBAL LIGATION      Social History   Social History  . Marital status: Married    Spouse name: N/A  . Number of children: N/A  . Years of education: N/A   Occupational History  . Not on file.   Social History Main Topics  . Smoking status: Never Smoker  . Smokeless tobacco: Never Used  . Alcohol use Yes     Comment: occassionally  . Drug use: No  . Sexual activity: Not on file   Other Topics Concern  . Not on file   Social History Narrative   Married; 2 children, 1 at home.    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 Depression      both sides of family   . Anemia Mother     Allergies  Allergen Reactions  . Methotrexate Derivatives Other (See Comments)    Elevated Liver Functions and Bruising  . Simponi [Golimumab] Rash    Medication list reviewed and updated in full in [facility].  GEN: No fevers, chills. Nontoxic. Primarily MSK c/o today. MSK: Detailed in the HPI GI: tolerating PO intake without difficulty Neuro: No numbness, parasthesias, or tingling associated. Otherwise the pertinent positives of the ROS are noted above.   Objective:   BP 122/88   Pulse 85  Temp 98.3 F (36.8 C) (Oral)   Ht 5' 1.25" (1.556 m)   Wt 179 lb 8 oz (81.4 kg)   BMI 33.64 kg/m    GEN: WDWN, NAD, Non-toxic, Alert & Oriented x 3 HEENT: Atraumatic, Normocephalic.  Ears and Nose: No external deformity. EXTR: No clubbing/cyanosis/edema NEURO: Normal gait.  PSYCH: Normally interactive. Conversant. Not depressed or anxious appearing.  Calm demeanor.   Knee:  B Gait: Normal heel toe pattern ROM: 0-120 Effusion: mild Echymosis or edema: none Patellar tendon NT Painful PLICA: neg Patellar grind: negative Medial and lateral patellar facet loading: negative medial and lateral joint lines: mild B medial and lateral Mcmurray's  neg Flexion-pinch neg Varus and valgus stress: stable Lachman: neg Ant and Post drawer: neg Hip abduction, IR, ER: WNL Hip flexion str: 5/5 Hip abd: 5/5 Quad: 5/5 VMO atrophy:mild Hamstring concentric and eccentric: 5/5   Radiology: No results found.  Assessment and Plan:   Rheumatoid arthritis involving both knees with positive rheumatoid factor (HCC) - Plan: methylPREDNISolone acetate (DEPO-MEDROL) injection 80 mg, methylPREDNISolone acetate (DEPO-MEDROL) injection 80 mg  Known rheumatoid arthritis with pain and swelling in both knees, almost certainly a rheumatoid arthritis flare of her knees. It would be reasonable to do corticosteroid injection to see if this can relieve her symptoms and pain.  Knee Injection, RIGHT Patient verbally consented to procedure. Risks (including potential rare risk of infection), benefits, and alternatives explained. Sterilely prepped with Chloraprep. Ethyl cholride used for anesthesia. 8 cc Lidocaine 1% mixed with 2 mL Depo-Medrol 40 mg injected using the anteromedial approach without difficulty. No complications with procedure and tolerated well. Patient had decreased pain post-injection.   Knee Injection, LEFT Patient verbally consented to procedure. Risks (including potential rare risk of infection), benefits, and alternatives explained. Sterilely prepped with Chloraprep. Ethyl cholride used for anesthesia. 8 cc Lidocaine 1% mixed with 2 mL Depo-Medrol 40 mg injected using the anteromedial approach without difficulty. No complications with procedure and tolerated well. Patient had decreased pain post-injection.     Follow-up: No Follow-up on file.  Patient Instructions  Take Tylenol/Acetaminophen ES (500mg ) 2 tabs by mouth three times a day max as needed.   Motrin 600 - 800 mg recommended TID. (Over the counter Motrin, Advil, or Generic Ibuprofen 200 mg tablets. 3-4 tablets by mouth 3 times a day. This equals a prescription strength dose.)    After you have an injection of any joint, the numbing medicine will make it feel better for a few hours. Later tonight, it is common for the joint to feel worse. The steroid will take 48-72 hours to start working - it is the thing that will likely provide the most relief.  Ice the joint where you had the injection at least 2-3 times a day for 20 minutes for 3 days. If have had swelling, pain in the joint itself, ice for 1 week.  You can use an ice bag, frozen peas or corn, an ice pack - all work     Medical illustrator,  Education officer, community T. Jaydon Soroka, MD   Patient's Medications  New Prescriptions   No medications on file  Previous Medications   FERROUS SULFATE 325 (65 FE) MG TABLET    Take 1 tablet (325 mg total) by mouth 3 (three) times daily after meals.   FISH OIL-OMEGA-3 FATTY ACIDS 1000 MG CAPSULE    Take 1 g by mouth as needed. Takes for joint pain   LISINOPRIL-HYDROCHLOROTHIAZIDE (PRINZIDE,ZESTORETIC) 10-12.5 MG TABLET    Take 1 tablet by  mouth daily.   MULTIPLE VITAMIN (MULTIVITAMIN) TABLET    Take 1 tablet by mouth daily.   TRAMADOL (ULTRAM) 50 MG TABLET    Take 1 tablet (50 mg total) by mouth every 6 (six) hours.  Modified Medications   No medications on file  Discontinued Medications   SIMVASTATIN (ZOCOR) 10 MG TABLET    Take 1 tablet (10 mg total) by mouth at bedtime.

## 2015-12-30 ENCOUNTER — Other Ambulatory Visit: Payer: Self-pay | Admitting: Family Medicine

## 2015-12-31 MED ORDER — TRAMADOL HCL 50 MG PO TABS: 50.0000 mg | ORAL_TABLET | Freq: Four times a day (QID) | ORAL | 0 refills | Status: DC

## 2015-12-31 NOTE — Telephone Encounter (Signed)
Last f/u 10/2015 

## 2015-12-31 NOTE — Telephone Encounter (Signed)
Rx called in to requested pharmacy 

## 2016-01-02 ENCOUNTER — Other Ambulatory Visit: Payer: Self-pay | Admitting: Family Medicine

## 2016-01-02 DIAGNOSIS — Z1231 Encounter for screening mammogram for malignant neoplasm of breast: Secondary | ICD-10-CM

## 2016-01-07 ENCOUNTER — Ambulatory Visit
Admission: RE | Admit: 2016-01-07 | Discharge: 2016-01-07 | Disposition: A | Discharge status: Home / Self Care | Payer: BLUE CROSS/BLUE SHIELD | Source: Ambulatory Visit | Attending: Family Medicine | Admitting: Family Medicine

## 2016-01-07 DIAGNOSIS — Z1231 Encounter for screening mammogram for malignant neoplasm of breast: Secondary | ICD-10-CM | POA: Diagnosis not present

## 2016-01-16 DIAGNOSIS — R748 Abnormal levels of other serum enzymes: Secondary | ICD-10-CM | POA: Diagnosis not present

## 2016-01-16 DIAGNOSIS — Z79899 Other long term (current) drug therapy: Secondary | ICD-10-CM | POA: Diagnosis not present

## 2016-01-16 DIAGNOSIS — M0579 Rheumatoid arthritis with rheumatoid factor of multiple sites without organ or systems involvement: Secondary | ICD-10-CM | POA: Diagnosis not present

## 2016-03-10 ENCOUNTER — Encounter: Payer: Self-pay | Admitting: Family Medicine

## 2016-03-10 ENCOUNTER — Ambulatory Visit: Payer: Self-pay | Admitting: Primary Care

## 2016-03-10 ENCOUNTER — Ambulatory Visit (INDEPENDENT_AMBULATORY_CARE_PROVIDER_SITE_OTHER): Payer: BLUE CROSS/BLUE SHIELD | Admitting: Family Medicine

## 2016-03-10 VITALS — BP 128/76 | HR 82 | Temp 98.3°F | Wt 180.5 lb

## 2016-03-10 DIAGNOSIS — M05741 Rheumatoid arthritis with rheumatoid factor of right hand without organ or systems involvement: Secondary | ICD-10-CM | POA: Diagnosis not present

## 2016-03-10 DIAGNOSIS — M05742 Rheumatoid arthritis with rheumatoid factor of left hand without organ or systems involvement: Secondary | ICD-10-CM | POA: Diagnosis not present

## 2016-03-10 DIAGNOSIS — R350 Frequency of micturition: Secondary | ICD-10-CM | POA: Insufficient documentation

## 2016-03-10 LAB — POC URINALSYSI DIPSTICK (AUTOMATED)
Bilirubin, UA: NEGATIVE
Blood, UA: NEGATIVE
Glucose, UA: NEGATIVE
Ketones, UA: NEGATIVE
Leukocytes, UA: NEGATIVE
Nitrite, UA: NEGATIVE
PH UA: 6
PROTEIN UA: NEGATIVE
SPEC GRAV UA: 1.025
UROBILINOGEN UA: 0.2

## 2016-03-10 MED ORDER — DEXAMETHASONE SODIUM PHOSPHATE 10 MG/ML IJ SOLN
10.0000 mg | Freq: Once | INTRAMUSCULAR | Status: AC
  Administered 2016-03-10: 10 mg via INTRAMUSCULAR

## 2016-03-10 NOTE — Patient Instructions (Signed)
Great to see you. Please schedule an appointment with rheumatology.

## 2016-03-10 NOTE — Assessment & Plan Note (Signed)
UA neg. Frequency may be due to bladder irritants... Drink water, avoid alcohol, caffeine, soda, citris, tomato, spicy foods.

## 2016-03-10 NOTE — Progress Notes (Signed)
Pre visit review using our clinic review tool, if applicable. No additional management support is needed unless otherwise documented below in the visit note. 

## 2016-03-10 NOTE — Assessment & Plan Note (Signed)
Explain to pt that frequent bursts of steroids are not ideal for her- will cause long term thinning of her bones. She states she will call rheumatology today. IM decadron given today.

## 2016-03-10 NOTE — Progress Notes (Signed)
SUBJECTIVE: Erin Bass is a 56 y.o. female who complains of urinary frequency, urgency without dysuria x 5 days, without flank pain, fever, chills, or abnormal vaginal discharge or bleeding.   She is having worsening RA joint pain.  In between rheumatologist.  Asks for another decadron injection today. Current Outpatient Prescriptions on File Prior to Visit  Medication Sig Dispense Refill  . ferrous sulfate 325 (65 FE) MG tablet Take 1 tablet (325 mg total) by mouth 3 (three) times daily after meals. (Patient taking differently: Take 325 mg by mouth daily with breakfast. )  3  . fish oil-omega-3 fatty acids 1000 MG capsule Take 1 g by mouth as needed. Takes for joint pain    . lisinopril-hydrochlorothiazide (PRINZIDE,ZESTORETIC) 10-12.5 MG tablet Take 1 tablet by mouth daily. 90 tablet 2  . Multiple Vitamin (MULTIVITAMIN) tablet Take 1 tablet by mouth daily.    . traMADol (ULTRAM) 50 MG tablet Take 1 tablet (50 mg total) by mouth every 6 (six) hours. 60 tablet 0   No current facility-administered medications on file prior to visit.     Allergies  Allergen Reactions  . Methotrexate Derivatives Other (See Comments)    Elevated Liver Functions and Bruising  . Simponi [Golimumab] Rash    Past Medical History:  Diagnosis Date  . Anemia   . Arthritis   . HTN (hypertension)     Past Surgical History:  Procedure Laterality Date  . ABDOMINAL HYSTERECTOMY     Partial- Uterus only  . bone spur - removal    . CESAREAN SECTION     x2  . PARTIAL HYSTERECTOMY  1/06   fibroids; ovaries left in   . TOTAL HIP ARTHROPLASTY Right 12/20/2012   Procedure: RIGHT TOTAL HIP ARTHROPLASTY ANTERIOR APPROACH;  Surgeon: Shelda Pal, MD;  Location: [facility];  Service: Orthopedics;  Laterality: Right;  . TUBAL LIGATION      Family History  Problem Relation Age of Onset  . Depression      both sides of family   . Anemia Mother     Social History   Social History  . Marital status: Married   Spouse name: N/A  . Number of children: N/A  . Years of education: N/A   Occupational History  . Not on file.   Social History Main Topics  . Smoking status: Never Smoker  . Smokeless tobacco: Never Used  . Alcohol use Yes     Comment: occassionally  . Drug use: No  . Sexual activity: Not on file   Other Topics Concern  . Not on file   Social History Narrative   Married; 2 children, 1 at home.    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  Review of Systems  Constitutional: Negative.   Respiratory: Negative.   Cardiovascular: Negative.   Gastrointestinal: Negative.   Genitourinary: Positive for frequency and urgency. Negative for dysuria, enuresis, flank pain, hematuria, menstrual problem and pelvic pain.  Musculoskeletal: Positive for arthralgias.  All other systems reviewed and are negative.   OBJECTIVE:  BP 128/76   Pulse 82   Temp 98.3 F (36.8 C) (Oral)   Wt 180 lb 8 oz (81.9 kg)   SpO2 91%   BMI 33.83 kg/m  Appears well, in no apparent distress.  Vital signs are normal. The abdomen is soft without tenderness,  guarding, mass, rebound or organomegaly. No CVA tenderness or inguinal adenopathy noted. Urine dipstick shows negative for all components.

## 2016-03-10 NOTE — Addendum Note (Signed)
Addended by: Desmond Dike on: 03/10/2016 09:00 AM   Modules accepted: Orders

## 2016-03-16 ENCOUNTER — Encounter: Payer: Self-pay | Admitting: Family Medicine

## 2016-03-16 ENCOUNTER — Ambulatory Visit
Admission: RE | Admit: 2016-03-16 | Discharge: 2016-03-16 | Disposition: A | Discharge status: Home / Self Care | Payer: BLUE CROSS/BLUE SHIELD | Source: Ambulatory Visit | Attending: Family Medicine | Admitting: Family Medicine

## 2016-03-16 ENCOUNTER — Ambulatory Visit (INDEPENDENT_AMBULATORY_CARE_PROVIDER_SITE_OTHER): Payer: BLUE CROSS/BLUE SHIELD | Admitting: Family Medicine

## 2016-03-16 ENCOUNTER — Ambulatory Visit (INDEPENDENT_AMBULATORY_CARE_PROVIDER_SITE_OTHER)
Admission: RE | Admit: 2016-03-16 | Discharge: 2016-03-16 | Disposition: A | Discharge status: Home / Self Care | Payer: BLUE CROSS/BLUE SHIELD | Source: Ambulatory Visit | Attending: Family Medicine | Admitting: Family Medicine

## 2016-03-16 VITALS — BP 140/90 | HR 76 | Temp 98.5°F | Ht 61.5 in | Wt 179.5 lb

## 2016-03-16 DIAGNOSIS — M05761 Rheumatoid arthritis with rheumatoid factor of right knee without organ or systems involvement: Secondary | ICD-10-CM

## 2016-03-16 DIAGNOSIS — M25551 Pain in right hip: Secondary | ICD-10-CM

## 2016-03-16 DIAGNOSIS — M05762 Rheumatoid arthritis with rheumatoid factor of left knee without organ or systems involvement: Secondary | ICD-10-CM

## 2016-03-16 DIAGNOSIS — Z471 Aftercare following joint replacement surgery: Secondary | ICD-10-CM | POA: Diagnosis not present

## 2016-03-16 DIAGNOSIS — Z96641 Presence of right artificial hip joint: Secondary | ICD-10-CM | POA: Diagnosis not present

## 2016-03-16 DIAGNOSIS — M25461 Effusion, right knee: Secondary | ICD-10-CM | POA: Diagnosis not present

## 2016-03-16 DIAGNOSIS — M1712 Unilateral primary osteoarthritis, left knee: Secondary | ICD-10-CM | POA: Diagnosis not present

## 2016-03-16 MED ORDER — PREDNISONE 10 MG PO TABS: ORAL_TABLET | ORAL | 0 refills | Status: DC

## 2016-03-16 NOTE — Patient Instructions (Signed)
[facility] Rheumatology Dr. Nickola Major and Dierdre Forth

## 2016-03-16 NOTE — Progress Notes (Signed)
Dr. Karleen Hampshire T. Jaye Saal, MD, CAQ Sports Medicine Primary Care and Sports Medicine [address] [place], [address] Phone: [phone] Fax: [phone]  03/16/2016  Patient: Erin Bass, MRN: [id number], DOB: 06-01-1960, 56 y.o.  Primary Physician:  Ruthe Mannan, MD   Chief Complaint  Patient presents with  . Knee Pain    Bilateral   Subjective:   Erin Bass is a 55 y.o. very pleasant female patient who presents with the following:  Patient has rheumatoid arthritis, currently not on any treatment at all who presents for evaluation for bilateral knee pain.  Previously I injected her knees about 3 or 4 months ago.  That provided relief for about one month.  She had it issues with both methotrexate as well as Simponi in the past.  She sees a rheumatologist in [facility].  The patient lives in [place].  Previously she saw Dr. Dareen Piano.  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  She questions the stability of her hip or if it is loosening at all.  Past Medical History, Surgical History, Social History, Family History, Problem List, Medications, and Allergies have been reviewed and updated if relevant.  Patient Active Problem List   Diagnosis Date Noted  . Urinary frequency 03/10/2016  . Overweight (BMI 25.0-29.9) 12/21/2012  . 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 Rheumatoid arthritis (HCC) 10/22/2011  . Microcytic anemia 05/28/2011  . POSTMENOPAUSAL STATUS 03/28/2009  . HLD (hyperlipidemia) 01/24/2008  . Essential hypertension 09/06/2006    Past Medical History:  Diagnosis Date  . Anemia   . Arthritis   . HTN (hypertension)     Past Surgical History:  Procedure Laterality Date  . ABDOMINAL HYSTERECTOMY     Partial- Uterus only  . bone spur - removal    . CESAREAN SECTION     x2  . PARTIAL HYSTERECTOMY  1/06   fibroids; ovaries left in     . TOTAL HIP ARTHROPLASTY Right 12/20/2012   Procedure: RIGHT TOTAL HIP ARTHROPLASTY ANTERIOR APPROACH;  Surgeon: Shelda Pal, MD;  Location: [facility];  Service: Orthopedics;  Laterality: Right;  . TUBAL LIGATION      Social History   Social History  . Marital status: Married    Spouse name: N/A  . Number of children: N/A  . Years of education: N/A   Occupational History  . Not on file.   Social History Main Topics  . Smoking status: Never Smoker  . Smokeless tobacco: Never Used  . Alcohol use Yes     Comment: occassionally  . Drug use: No  . Sexual activity: Not on file   Other Topics Concern  . Not on file   Social History Narrative   Married; 2 children, 1 at home.    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 Depression      both sides of family   . Anemia Mother     Allergies  Allergen Reactions  . Methotrexate Derivatives Other (See Comments)    Elevated Liver Functions and Bruising  . Simponi [Golimumab] Rash    Medication list reviewed and updated in full in [facility].  GEN: No fevers, chills. Nontoxic. Primarily MSK c/o today. MSK: Detailed in the HPI GI: tolerating PO intake without  difficulty Neuro: No numbness, parasthesias, or tingling associated. Otherwise the pertinent positives of the ROS are noted above.   Objective:   BP 140/90   Pulse 76   Temp 98.5 F (36.9 C) (Oral)   Ht 5' 1.5" (1.562 m)   Wt 179 lb 8 oz (81.4 kg)   BMI 33.37 kg/m    GEN: WDWN, NAD, Non-toxic, Alert & Oriented x 3 HEENT: Atraumatic, Normocephalic.  Ears and Nose: No external deformity. EXTR: No clubbing/cyanosis/edema NEURO: Normal gait.  PSYCH: Normally interactive. Conversant. Not depressed or anxious appearing.  Calm demeanor.   HIP EXAM: SIDE: R ROM: Abduction, Flexion, Internal and External range of motion: full ROM Pain with terminal IROM and EROM: none GTB: NT SLR: NEG Knees:  No effusion FABER: NT REVERSE FABER: NT, neg  Knee:  B Gait: Normal heel toe pattern ROM: R side: 0-95, L 0-110 Effusion:  Mild-mod b Echymosis or edema: none Patellar tendon NT Painful PLICA: neg Patellar grind: negative Medial and lateral patellar facet loading: mild pain medial and lateral joint lines: medial > lateral joint line pain Mcmurray's pain Flexion-pinch pain Varus and valgus stress: stable Lachman: neg Ant and Post drawer: neg Hip abduction, IR, ER: WNL Hip flexion str: 5/5 Hip abd: 5/5 Quad: 5/5 VMO atrophy:mild Hamstring concentric and eccentric: 5/5    Radiology: Dg Hip Unilat With Pelvis 2-3 Views Right  Result Date: 03/16/2016 CLINICAL DATA:  Right hip pain, status post right total hip replacement EXAM: DG HIP (WITH OR WITHOUT PELVIS) 2-3V RIGHT COMPARISON:  None. FINDINGS: Three views of the right hip submitted. No acute fracture or subluxation. There is right hip prosthesis with anatomic alignment. The prosthesis material appears intact. No evidence of prosthesis loosening. Pelvic phleboliths are noted. IMPRESSION: No acute fracture or subluxation. Right hip prosthesis with anatomic alignment. No evidence of prosthesis loosening. Electronically Signed   By: Natasha Mead M.D.   On: 03/16/2016 13:00   Dg Knee Ap/lat W/sunrise Left  Result Date: 03/16/2016 CLINICAL DATA:  Left knee pain, osteoarthritis EXAM: LEFT KNEE 3 VIEWS COMPARISON:  None. FINDINGS: Three views of the left knee submitted. No acute fracture or subluxation. There is narrowing of medial joint compartment. Minimal spurring of medial femoral condyle. Narrowing of patellofemoral joint space. Moderate joint effusion. IMPRESSION: No acute fracture or subluxation. Osteoarthritic changes as described above. Moderate joint effusion. Electronically Signed   By: Natasha Mead M.D.   On: 03/16/2016 13:02   Dg Knee Ap/lat W/sunrise Right  Result Date: 03/16/2016 CLINICAL DATA:  Pain EXAM: RIGHT KNEE 4 VIEWS  COMPARISON:  None. FINDINGS: Weightbearing frontal, weight-bearing tunnel, weight-bearing lateral, and sunrise patellar images obtained. There is no fracture or dislocation. There is a moderate joint effusion. There is relatively mild narrowing of the medial compartment. Other joint spaces appear unremarkable. No erosive change. IMPRESSION: Joint space narrowing medially. Moderate joint effusion. No fracture or dislocation. Electronically Signed   By: Bretta Bang III M.D.   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  No loosening.  Clinical exam, effusion, symptoms out way radiographical findings on plain knee films.  I'm highly suspicious that the patient's symptoms primarily are coming from decompensated rheumatoid arthritis.  I strongly recommended that she follow-up  with a rheumatologist. I recommended [facility] to her.   Given the severity of her symptoms currently, I'm going to give her some steroids orally right now.  Follow-up: prn only  Meds ordered this encounter  Medications  . predniSONE (DELTASONE) 10 MG tablet    Sig: 4 tabs po for 4 days, 3 tabs po for 4 days, then 2 tabs for 4 days, 1 tab for 4 days    Dispense:  40 tablet    Refill:  0   Medications Discontinued During This Encounter  Medication Reason  . leflunomide (ARAVA) 10 MG tablet Side effect (s)   Orders Placed This Encounter  Procedures  . DG Knee AP/LAT W/Sunrise Left  . DG Knee AP/LAT W/Sunrise Right  . DG HIP UNILAT WITH PELVIS 2-3 VIEWS RIGHT    Signed,  Dicky Boer T. Vanilla Heatherington, MD   Allergies as of 03/16/2016      Reactions   Methotrexate Derivatives Other (See Comments)   Elevated Liver Functions and Bruising    Simponi [golimumab] Rash      Medication List       Accurate as of 03/16/16 11:59 PM. Always use your most recent med list.          ferrous sulfate 325 (65 FE) MG tablet Take 1 tablet (325 mg total) by mouth 3 (three) times daily after meals.   fish oil-omega-3 fatty acids 1000 MG capsule Take 1 g by mouth as needed. Takes for joint pain   lisinopril-hydrochlorothiazide 10-12.5 MG tablet Commonly known as:  PRINZIDE,ZESTORETIC Take 1 tablet by mouth daily.   multivitamin tablet Take 1 tablet by mouth daily.   predniSONE 10 MG tablet Commonly known as:  DELTASONE 4 tabs po for 4 days, 3 tabs po for 4 days, then 2 tabs for 4 days, 1 tab for 4 days   traMADol 50 MG tablet Commonly known as:  ULTRAM Take 1 tablet (50 mg total) by mouth every 6 (six) hours.

## 2016-03-16 NOTE — Progress Notes (Signed)
Pre visit review using our clinic review tool, if applicable. No additional management support is needed unless otherwise documented below in the visit note. 

## 2016-03-18 ENCOUNTER — Other Ambulatory Visit: Payer: Self-pay | Admitting: Family Medicine

## 2016-03-18 ENCOUNTER — Telehealth: Payer: Self-pay | Admitting: *Deleted

## 2016-03-18 MED ORDER — TRAMADOL HCL 50 MG PO TABS: 50.0000 mg | ORAL_TABLET | Freq: Four times a day (QID) | ORAL | 0 refills | Status: DC

## 2016-03-18 NOTE — Telephone Encounter (Signed)
PT sent in the following message via MyChart.  Appointment Request From: Tonia Ghent. Henneke    With Provider: Ruthe Mannan, MD [facility]]    Preferred Date Range: Any date 03/18/2016 or later    Preferred Times: Any    Reason: To address the following health maintenance concerns.  Hepatitis C Screening    Comments:  Had a Hepatitis C test on Nov 20, 2014 at rheumatologist office.  Test result:  0.1s/ co ratio

## 2016-03-18 NOTE — Telephone Encounter (Signed)
Called in to [facility] - [place], [place] [address]: [phone]

## 2016-03-18 NOTE — Telephone Encounter (Signed)
Please request a copy of labs from rheumatologist.

## 2016-03-18 NOTE — Telephone Encounter (Signed)
Requested from [facility] from Dr. Francee Gentile

## 2016-03-18 NOTE — Telephone Encounter (Signed)
Last refill 12/31/2015, last OV 02/2016. OK TO REFILL?

## 2016-04-21 DIAGNOSIS — I10 Essential (primary) hypertension: Secondary | ICD-10-CM | POA: Diagnosis not present

## 2016-04-21 DIAGNOSIS — M25562 Pain in left knee: Secondary | ICD-10-CM | POA: Diagnosis not present

## 2016-04-21 DIAGNOSIS — M25561 Pain in right knee: Secondary | ICD-10-CM | POA: Diagnosis not present

## 2016-05-07 ENCOUNTER — Other Ambulatory Visit: Payer: Self-pay | Admitting: Family Medicine

## 2016-05-07 MED ORDER — TRAMADOL HCL 50 MG PO TABS: 50.0000 mg | ORAL_TABLET | Freq: Four times a day (QID) | ORAL | 0 refills | Status: DC

## 2016-05-07 NOTE — Telephone Encounter (Signed)
Left refill on voice mail at pharmacy  

## 2016-05-07 NOTE — Telephone Encounter (Signed)
Last filled 03-18-16 #50 Last OV 03-16-16 No Future OV

## 2016-06-08 ENCOUNTER — Encounter: Payer: Self-pay | Admitting: Family Medicine

## 2016-06-08 ENCOUNTER — Other Ambulatory Visit: Payer: Self-pay

## 2016-06-08 MED ORDER — LISINOPRIL-HYDROCHLOROTHIAZIDE 10-12.5 MG PO TABS: 1.0000 | ORAL_TABLET | Freq: Every day | ORAL | 2 refills | Status: DC

## 2016-06-25 ENCOUNTER — Other Ambulatory Visit: Payer: Self-pay | Admitting: Family Medicine

## 2016-06-25 MED ORDER — TRAMADOL HCL 50 MG PO TABS: 50.0000 mg | ORAL_TABLET | Freq: Four times a day (QID) | ORAL | 0 refills | Status: DC

## 2016-06-25 NOTE — Telephone Encounter (Signed)
Called in to [facility] - [place], [place] - [address]: [phone]  

## 2016-06-25 NOTE — Telephone Encounter (Signed)
Last refill 05/07/16 Last OV 03/16/16 with Dr. Reesa Chew to refill?

## 2016-07-13 ENCOUNTER — Other Ambulatory Visit: Payer: Self-pay | Admitting: Family Medicine

## 2016-07-13 MED ORDER — PREDNISONE 10 MG PO TABS: ORAL_TABLET | ORAL | 0 refills | Status: DC

## 2016-07-13 NOTE — Telephone Encounter (Signed)
Pt is requesting another round of prednisone on mychart, last filled was 03/16/16

## 2016-08-19 DIAGNOSIS — E785 Hyperlipidemia, unspecified: Secondary | ICD-10-CM | POA: Diagnosis not present

## 2016-08-19 DIAGNOSIS — I10 Essential (primary) hypertension: Secondary | ICD-10-CM | POA: Diagnosis not present

## 2016-08-19 DIAGNOSIS — M0579 Rheumatoid arthritis with rheumatoid factor of multiple sites without organ or systems involvement: Secondary | ICD-10-CM | POA: Diagnosis not present

## 2016-08-19 DIAGNOSIS — Z96649 Presence of unspecified artificial hip joint: Secondary | ICD-10-CM | POA: Diagnosis not present

## 2016-09-03 DIAGNOSIS — M0579 Rheumatoid arthritis with rheumatoid factor of multiple sites without organ or systems involvement: Secondary | ICD-10-CM | POA: Diagnosis not present

## 2016-09-03 DIAGNOSIS — M25561 Pain in right knee: Secondary | ICD-10-CM | POA: Diagnosis not present

## 2016-09-03 DIAGNOSIS — M25562 Pain in left knee: Secondary | ICD-10-CM | POA: Diagnosis not present

## 2016-09-03 DIAGNOSIS — E785 Hyperlipidemia, unspecified: Secondary | ICD-10-CM | POA: Diagnosis not present

## 2016-09-03 DIAGNOSIS — I10 Essential (primary) hypertension: Secondary | ICD-10-CM | POA: Diagnosis not present

## 2016-09-03 DIAGNOSIS — Z96649 Presence of unspecified artificial hip joint: Secondary | ICD-10-CM | POA: Diagnosis not present

## 2016-09-03 DIAGNOSIS — M25461 Effusion, right knee: Secondary | ICD-10-CM | POA: Diagnosis not present

## 2016-09-03 DIAGNOSIS — M25462 Effusion, left knee: Secondary | ICD-10-CM | POA: Diagnosis not present

## 2016-10-28 ENCOUNTER — Ambulatory Visit (INDEPENDENT_AMBULATORY_CARE_PROVIDER_SITE_OTHER): Payer: BLUE CROSS/BLUE SHIELD | Admitting: Family Medicine

## 2016-10-28 ENCOUNTER — Encounter: Payer: Self-pay | Admitting: Family Medicine

## 2016-10-28 VITALS — BP 132/90 | HR 84 | Temp 98.4°F | Ht 61.5 in | Wt 188.5 lb

## 2016-10-28 DIAGNOSIS — M069 Rheumatoid arthritis, unspecified: Secondary | ICD-10-CM | POA: Diagnosis not present

## 2016-10-28 MED ORDER — DEXAMETHASONE SODIUM PHOSPHATE 100 MG/10ML IJ SOLN
10.0000 mg | Freq: Once | INTRAMUSCULAR | Status: AC
  Administered 2016-10-28: 10 mg via INTRAMUSCULAR

## 2016-10-28 NOTE — Patient Instructions (Signed)

## 2016-10-28 NOTE — Progress Notes (Signed)
Dr. Karleen Hampshire T. Le Faulcon, MD, CAQ Sports Medicine Primary Care and Sports Medicine [address] [place], [address] Phone: [phone] Fax: [phone]  10/28/2016  Patient: Erin Bass, MRN: [id number], DOB: 04-27-1960, 56 y.o.  Primary Physician:  Dianne Dun, MD   Chief Complaint  Patient presents with  . Joint Swelling    Bilateral Knees but left is worse   Subjective:   Erin Bass is a 56 y.o. very pleasant female patient who presents with the following:  B knee pain: Went to a Rheumatologist in August. Could not afford meds. She has some paperwork from this office visit, and she is here with bilateral knee pain as well as some hip pain and multi-joint involvements in the hand as well.  She is having some swelling and pain.   The patient wanted me to inject her knees with corticosteroids, and she recently had this done approximately 2 months ago per report at rheumatology.  Rheum.   Past Medical History, Surgical History, Social History, Family History, Problem List, Medications, and Allergies have been reviewed and updated if relevant.  Patient Active Problem List   Diagnosis Date Noted  . Urinary frequency 03/10/2016  . Overweight (BMI 25.0-29.9) 12/21/2012  . 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 Rheumatoid arthritis (HCC) 10/22/2011  . Microcytic anemia 05/28/2011  . POSTMENOPAUSAL STATUS 03/28/2009  . HLD (hyperlipidemia) 01/24/2008  . Essential hypertension 09/06/2006    Past Medical History:  Diagnosis Date  . Anemia   . Arthritis   . HTN (hypertension)     Past Surgical History:  Procedure Laterality Date  . ABDOMINAL HYSTERECTOMY     Partial- Uterus only  . bone spur - removal    . CESAREAN SECTION     x2  . PARTIAL HYSTERECTOMY  1/06   fibroids; ovaries left in   . TOTAL HIP ARTHROPLASTY Right 12/20/2012   Procedure: RIGHT TOTAL HIP ARTHROPLASTY ANTERIOR APPROACH;  Surgeon: Shelda Pal, MD;  Location: [facility];  Service: Orthopedics;  Laterality: Right;  . TUBAL LIGATION      Social History   Social History  . Marital status: Married    Spouse name: N/A  . Number of children: N/A  . Years of education: N/A   Occupational History  . Not on file.   Social History Main Topics  . Smoking status: Never Smoker  . Smokeless tobacco: Never Used  . Alcohol use Yes     Comment: occassionally  . Drug use: No  . Sexual activity: Not on file   Other Topics Concern  . Not on file   Social History Narrative   Married; 2 children, 1 at home.    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 Depression Unknown        both sides of family   . Anemia Mother     Allergies  Allergen Reactions  . Methotrexate Derivatives Other (See Comments)    Elevated Liver Functions and Bruising  . Simponi [Golimumab] Rash    Medication list reviewed and updated in full in [facility].   GEN: No acute illnesses, no fevers, chills. GI: No n/v/d, eating normally Pulm: No SOB Interactive and getting along well at home.  Otherwise, ROS is as per the HPI.  Objective:   BP 132/90   Pulse 84  Temp 98.4 F (36.9 C) (Oral)   Ht 5' 1.5" (1.562 m)   Wt 188 lb 8 oz (85.5 kg)   BMI 35.04 kg/m   GEN: WDWN, NAD, Non-toxic, A & O x 3 HEENT: Atraumatic, Normocephalic. Neck supple. No masses, No LAD. Ears and Nose: No external deformity. CV: RRR, No M/G/R. No JVD. No thrill. No extra heart sounds. PULM: CTA B, no wheezes, crackles, rhonchi. No retractions. No resp. distress. No accessory muscle use. EXTR: No c/c/e NEURO Normal gait.  PSYCH: Normally interactive. Conversant. Not depressed or anxious appearing.  Calm demeanor.   Bilateral knees: Lacks 2 deg extension, flexion to 105, mild effusion.  Stable to varus and valgus stress. There is notable tenderness on the joint line.  Medial and lateral.  Drawer  testing is negative.  She also has some swelling in her fingers and in the finger joints as well as MCP joints and wrists.  Laboratory and Imaging Data:  Assessment and Plan:   Rheumatoid arthritis involving multiple joints (HCC) - Plan: Ambulatory referral to Rheumatology, dexamethasone (DECADRON) injection 10 mg  Early thing that the patient needs to establish care with rheumatology for definitive management of her disease.  I brought this up to her approximately 10 times today in the office to emphasize this point.  I am referring her to [facility] rheumatology.  I gave her 10 mg of Decadron in the office today to try to calm this down a little bit.  Follow-up: No Follow-up on file.  Meds ordered this encounter  Medications  . Turmeric 500 MG TABS    Sig: Take 2 tablets by mouth daily.  Marland Kitchen dexamethasone (DECADRON) injection 10 mg   Medications Discontinued During This Encounter  Medication Reason  . predniSONE (DELTASONE) 10 MG tablet Completed Course   Orders Placed This Encounter  Procedures  . Ambulatory referral to Rheumatology    Signed,  Karleen Hampshire T. Jaykub Mackins, MD   Allergies as of 10/28/2016      Reactions   Methotrexate Derivatives Other (See Comments)   Elevated Liver Functions and Bruising   Simponi [golimumab] Rash      Medication List       Accurate as of 10/28/16 11:59 PM. Always use your most recent med list.          ferrous sulfate 325 (65 FE) MG tablet Take 1 tablet (325 mg total) by mouth 3 (three) times daily after meals.   fish oil-omega-3 fatty acids 1000 MG capsule Take 1 g by mouth as needed. Takes for joint pain   lisinopril-hydrochlorothiazide 10-12.5 MG tablet Commonly known as:  PRINZIDE,ZESTORETIC Take 1 tablet by mouth daily.   multivitamin tablet Take 1 tablet by mouth daily.   traMADol 50 MG tablet Commonly known as:  ULTRAM Take 1 tablet (50 mg total) by mouth every 6 (six) hours.   Turmeric 500 MG Tabs Take 2 tablets  by mouth daily.

## 2016-11-12 ENCOUNTER — Other Ambulatory Visit (HOSPITAL_COMMUNITY)
Admission: RE | Admit: 2016-11-12 | Discharge: 2016-11-12 | Disposition: A | Discharge status: Home / Self Care | Payer: BLUE CROSS/BLUE SHIELD | Source: Ambulatory Visit | Attending: Family Medicine | Admitting: Family Medicine

## 2016-11-12 ENCOUNTER — Telehealth: Payer: Self-pay | Admitting: Family Medicine

## 2016-11-12 ENCOUNTER — Ambulatory Visit (INDEPENDENT_AMBULATORY_CARE_PROVIDER_SITE_OTHER): Payer: BLUE CROSS/BLUE SHIELD | Admitting: Family Medicine

## 2016-11-12 ENCOUNTER — Ambulatory Visit: Payer: Self-pay | Admitting: Family Medicine

## 2016-11-12 ENCOUNTER — Encounter: Payer: Self-pay | Admitting: Family Medicine

## 2016-11-12 VITALS — BP 134/90 | HR 86 | Temp 98.3°F | Ht 61.25 in | Wt 191.4 lb

## 2016-11-12 DIAGNOSIS — M069 Rheumatoid arthritis, unspecified: Secondary | ICD-10-CM

## 2016-11-12 DIAGNOSIS — I10 Essential (primary) hypertension: Secondary | ICD-10-CM | POA: Diagnosis not present

## 2016-11-12 DIAGNOSIS — Z01419 Encounter for gynecological examination (general) (routine) without abnormal findings: Secondary | ICD-10-CM | POA: Insufficient documentation

## 2016-11-12 MED ORDER — HYDROCODONE-ACETAMINOPHEN 5-325 MG PO TABS: 1.0000 | ORAL_TABLET | Freq: Four times a day (QID) | ORAL | 0 refills | Status: DC | PRN

## 2016-11-12 NOTE — Telephone Encounter (Signed)
Copied from CRM [id number]. Topic: Quick Communication - See Telephone Encounter >> Nov 12, 2016 12:38 PM Arlyss Gandy, NT wrote: CRM for notification. See Telephone encounter for:  11/12/16. Marchelle Folks is calling from [place] [facility] pharmacy in regards to the hydrocodone that was wrote for this pt today. Is this pts condition acute? Or does she need a 8 day supply? Please give Marchelle Folks a call at  [phone]

## 2016-11-12 NOTE — Telephone Encounter (Signed)
This is not a chronic issue but we are only trying Norco for the first time,for her RA, unsure if it will be a chronic/long term rx.

## 2016-11-12 NOTE — Progress Notes (Signed)
Subjective:   [name], female    DOB: 11/08/1960, 56 y.o.   MRN: [id number]  Erin Bass is a pleasant 56 y.o. year old female who presents to clinic today with Annual Exam (Patient is here today for a PAP.  She states that she had labs at Rheumatologist in August. She would like to discuss her pain medication today.)  on 11/12/2016  HPI:  Colonoscopy 08/31/13 Mammogram 01/07/16  Remote h/o hysterectomy but she is unsure if she still has a cervix.  RA- had appointment with Rheum in August- labs were done there. She will get a copy from her mychart for me.  It was done at [facility]  Feels Tramadol is not working for her pain.  Wants to try short course of Norco. Current Outpatient Prescriptions on File Prior to Visit  Medication Sig Dispense Refill  . ferrous sulfate 325 (65 FE) MG tablet Take 1 tablet (325 mg total) by mouth 3 (three) times daily after meals. (Patient taking differently: Take 325 mg by mouth daily with breakfast. )  3  . fish oil-omega-3 fatty acids 1000 MG capsule Take 1 g by mouth as needed. Takes for joint pain    . lisinopril-hydrochlorothiazide (PRINZIDE,ZESTORETIC) 10-12.5 MG tablet Take 1 tablet by mouth daily. 90 tablet 2  . Multiple Vitamin (MULTIVITAMIN) tablet Take 1 tablet by mouth daily.    . Turmeric 500 MG TABS Take 2 tablets by mouth daily.     No current facility-administered medications on file prior to visit.     Allergies  Allergen Reactions  . Methotrexate Derivatives Other (See Comments)    Elevated Liver Functions and Bruising  . Simponi [Golimumab] Rash    Past Medical History:  Diagnosis Date  . Anemia   . Arthritis   . HTN (hypertension)     Past Surgical History:  Procedure Laterality Date  . ABDOMINAL HYSTERECTOMY     Partial- Uterus only  . bone spur - removal    . CESAREAN SECTION     x2  . PARTIAL HYSTERECTOMY  1/06   fibroids; ovaries left in   . TOTAL HIP ARTHROPLASTY Right 12/20/2012   Procedure:  RIGHT TOTAL HIP ARTHROPLASTY ANTERIOR APPROACH;  Surgeon: Shelda Pal, MD;  Location: [facility];  Service: Orthopedics;  Laterality: Right;  . TUBAL LIGATION      Family History  Problem Relation Age of Onset  . Depression Unknown        both sides of family   . Anemia Mother     Social History   Social History  . Marital status: Married    Spouse name: N/A  . Number of children: N/A  . Years of education: N/A   Occupational History  . Not on file.   Social History Main Topics  . Smoking status: Never Smoker  . Smokeless tobacco: Never Used  . Alcohol use Yes     Comment: occassionally  . Drug use: No  . Sexual activity: Not on file   Other Topics Concern  . Not on file   Social History Narrative   Married; 2 children, 1 at home.    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   Review of Systems  Constitutional: Negative.   HENT: Negative.   Respiratory: Negative.   Cardiovascular: Negative.   Gastrointestinal:  Negative.   Endocrine: Negative.   Genitourinary: Negative.   Musculoskeletal: Positive for arthralgias.  Allergic/Immunologic: Negative.   Neurological: Negative.   Hematological: Negative.   Psychiatric/Behavioral: Negative.   All other systems reviewed and are negative.      Objective:    BP 134/90 (BP Location: Left Arm, Patient Position: Sitting, Cuff Size: Normal)   Pulse 86   Temp 98.3 F (36.8 C) (Oral)   Ht 5' 1.25" (1.556 m)   Wt 191 lb 6.4 oz (86.8 kg)   SpO2 98%   BMI 35.87 kg/m    Physical Exam   General:  Well-developed,well-nourished,in no acute distress; alert,appropriate and cooperative throughout examination Head:  normocephalic and atraumatic.   Eyes:  vision grossly intact, PERRL Ears:  R ear normal and L ear normal externally, TMs clear bilaterally Nose:  no external  deformity.   Mouth:  good dentition.   Neck:  No deformities, masses, or tenderness noted. Breasts:  No mass, nodules, thickening, tenderness, bulging, retraction, inflamation, nipple discharge or skin changes noted.   Lungs:  Normal respiratory effort, chest expands symmetrically. Lungs are clear to auscultation, no crackles or wheezes. Heart:  Normal rate and regular rhythm. S1 and S2 normal without gallop, murmur, click, rub or other extra sounds. Abdomen:  Bowel sounds positive,abdomen soft and non-tender without masses, organomegaly or hernias noted. Rectal:  no external abnormalities.   Genitalia:  Pelvic Exam:        External: normal female genitalia without lesions or masses        Vagina: normal without lesions or masses        Cervix: absent        Adnexa: normal bimanual exam without masses or fullness        Uterus: absent        Pap smear: performed Msk:  No deformity or scoliosis noted of thoracic or lumbar spine.   Extremities:  No clubbing, cyanosis, edema, or deformity noted with normal full range of motion of all joints.   Neurologic:  alert & oriented X3 and gait normal.   Skin:  Intact without suspicious lesions or rashes Cervical Nodes:  No lymphadenopathy noted Axillary Nodes:  No palpable lymphadenopathy Psych:  Cognition and judgment appear intact. Alert and cooperative with normal attention span and concentration. No apparent delusions, illusions, hallucinations       Assessment & Plan:   Rheumatoid arthritis involving multiple joints (HCC)  Well woman exam with routine gynecological exam - Plan: Cytology - PAP  Essential hypertension No Follow-up on file.

## 2016-11-12 NOTE — Telephone Encounter (Signed)
The number provided was for a lawn care and maintenance via the NT's instructions/Called pharmacy and LM stating TA's response/thx dmf

## 2016-11-12 NOTE — Assessment & Plan Note (Signed)
Reviewed preventive care protocols, scheduled due services, and updated immunizations Discussed nutrition, exercise, diet, and healthy lifestyle.  

## 2016-11-12 NOTE — Assessment & Plan Note (Signed)
Deteriorated. While she is awaiting [employer] approval of RA rx, I do feel short course of narcotic is appropriate. Rx for [employer] out and given to pt.

## 2016-11-16 LAB — CYTOLOGY - PAP
Diagnosis: NEGATIVE
HPV (WINDOPATH): NOT DETECTED

## 2016-11-23 ENCOUNTER — Encounter: Payer: Self-pay | Admitting: Family Medicine

## 2016-11-23 ENCOUNTER — Ambulatory Visit: Payer: BLUE CROSS/BLUE SHIELD | Admitting: Family Medicine

## 2016-11-23 VITALS — BP 130/80 | HR 83 | Temp 97.7°F | Ht 61.25 in | Wt 187.5 lb

## 2016-11-23 DIAGNOSIS — M25561 Pain in right knee: Secondary | ICD-10-CM

## 2016-11-23 DIAGNOSIS — M05761 Rheumatoid arthritis with rheumatoid factor of right knee without organ or systems involvement: Secondary | ICD-10-CM | POA: Diagnosis not present

## 2016-11-23 DIAGNOSIS — G8929 Other chronic pain: Secondary | ICD-10-CM

## 2016-11-23 DIAGNOSIS — M25562 Pain in left knee: Secondary | ICD-10-CM

## 2016-11-23 DIAGNOSIS — M05762 Rheumatoid arthritis with rheumatoid factor of left knee without organ or systems involvement: Secondary | ICD-10-CM | POA: Diagnosis not present

## 2016-11-23 MED ORDER — METHYLPREDNISOLONE ACETATE 40 MG/ML IJ SUSP
80.0000 mg | Freq: Once | INTRAMUSCULAR | Status: AC
  Administered 2016-11-23: 80 mg via INTRA_ARTICULAR

## 2016-11-23 NOTE — Progress Notes (Signed)
Dr. Karleen Hampshire T. Chloie Loney, MD, CAQ [facility] [address] [place], [address] Phone: [phone] Fax: [phone]  11/23/2016  Patient: Erin Bass, MRN: [id number], DOB: 01-10-1961, 56 y.o.  Primary Physician:  Dianne Dun, MD   Chief Complaint  Patient presents with  . Knee Pain    Bilateral Knee Injections   Subjective:   Erin Bass is a 56 y.o. very pleasant female patient who presents with the following:  DB RA: prior past issues with simponi and MTX. Now currently without a Rheum home. I gave her a course of steroids in early October. She is now here in f/u for B knee injections.  B knee injections  Past Medical History, Surgical History, Social History, Family History, Problem List, Medications, and Allergies have been reviewed and updated if relevant.  Patient Active Problem List   Diagnosis Date Noted  . Well woman exam with routine gynecological exam 11/12/2016  . Urinary frequency 03/10/2016  . Overweight (BMI 25.0-29.9) 12/21/2012  . 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 Rheumatoid arthritis involving multiple joints (HCC) 10/22/2011  . Microcytic anemia 05/28/2011  . POSTMENOPAUSAL STATUS 03/28/2009  . HLD (hyperlipidemia) 01/24/2008  . Essential hypertension 09/06/2006    Past Medical History:  Diagnosis Date  . Anemia   . Arthritis   . HTN (hypertension)     Past Surgical History:  Procedure Laterality Date  . ABDOMINAL HYSTERECTOMY     Partial- Uterus only  . bone spur - removal    . CESAREAN SECTION     x2  . PARTIAL HYSTERECTOMY  1/06   fibroids; ovaries left in   . TUBAL LIGATION      Social History   Socioeconomic History  . Marital status: Married    Spouse name: Not on file  . Number of children: Not on file  . Years of education: Not on file  . Highest education level: Not on file  Social Needs  . Financial resource strain: Not on  file  . Food insecurity - worry: Not on file  . Food insecurity - inability: Not on file  . Transportation needs - medical: Not on file  . Transportation needs - non-medical: Not on file  Occupational History  . Not on file  Tobacco Use  . Smoking status: Never Smoker  . Smokeless tobacco: Never Used  Substance and Sexual Activity  . Alcohol use: Yes    Comment: occassionally  . Drug use: No  . Sexual activity: Not on file  Other Topics Concern  . Not on file  Social History Narrative   Married; 2 children, 1 at home.    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 Depression Unknown        both sides of family   . Anemia Mother     Allergies  Allergen Reactions  . Methotrexate Derivatives Other (See Comments)    Elevated Liver Functions and Bruising  . Simponi [Golimumab] Rash    Medication list reviewed and updated in full in [facility].  GEN: No fevers, chills. Nontoxic. Primarily MSK c/o today. MSK: Detailed in the HPI GI: tolerating PO intake without difficulty Neuro: No numbness, parasthesias, or tingling associated. Otherwise the pertinent positives of the ROS are noted above.   Objective:   BP 130/80  Pulse 83   Temp 97.7 F (36.5 C) (Oral)   Ht 5' 1.25" (1.556 m)   Wt 187 lb 8 oz (85 kg)   BMI 35.14 kg/m    GEN: WDWN, NAD, Non-toxic, Alert & Oriented x 3 HEENT: Atraumatic, Normocephalic.  Ears and Nose: No external deformity. EXTR: No clubbing/cyanosis/edema NEURO: Normal gait.  PSYCH: Normally interactive. Conversant. Not depressed or anxious appearing.  Calm demeanor.    B medial and lateral joint line pain, worst on the L. Minimal effusion B. Full ext and flexion to 110. Stable MCL, LCL, ACL, and PCL. Flexion pinch is painful. Mcmurrays is + for mild pain.  Radiology: CLINICAL DATA:  Left knee pain, osteoarthritis   EXAM: LEFT KNEE 3 VIEWS   COMPARISON:  None.     FINDINGS: Three views of the left knee submitted. No acute fracture or subluxation. There is narrowing of medial joint compartment. Minimal spurring of medial femoral condyle. Narrowing of patellofemoral joint space. Moderate joint effusion.   IMPRESSION: No acute fracture or subluxation. Osteoarthritic changes as described above. Moderate joint effusion.     Electronically Signed   By: Natasha Mead M.D.   On: 03/16/2016 13:02 CLINICAL DATA:  Pain   EXAM: RIGHT KNEE 4 VIEWS   COMPARISON:  None.   FINDINGS: Weightbearing frontal, weight-bearing tunnel, weight-bearing lateral, and sunrise patellar images obtained. There is no fracture or dislocation. There is a moderate joint effusion. There is relatively mild narrowing of the medial compartment. Other joint spaces appear unremarkable. No erosive change.   IMPRESSION: Joint space narrowing medially. Moderate joint effusion. No fracture or dislocation.     Electronically Signed   By: Bretta Bang III M.D.   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 She has found a Rheum closer to her home in [place]. (Lives in [place])  Reasonable to inject her knees today for symptomatic pain management.  Knee Injection, RIGHT Patient verbally consented to procedure. Risks (including potential rare risk of infection), benefits, and alternatives explained. Sterilely prepped with Chloraprep. Ethyl cholride used for anesthesia. 8 cc Lidocaine 1% mixed with 2 mL Depo-Medrol 40 mg injected using the anteromedial approach without difficulty. No complications with procedure and tolerated well. Patient had decreased pain post-injection.   Knee Injection,  LEFT Patient verbally consented to procedure. Risks (including potential rare risk of infection), benefits, and alternatives explained. Sterilely prepped with Chloraprep. Ethyl cholride used for anesthesia. 8 cc Lidocaine 1% mixed with 2 mL Depo-Medrol 40 mg injected using the anteromedial approach without difficulty. No complications with procedure and tolerated well. Patient had decreased pain post-injection.    Follow-up: No Follow-up on file.  Meds ordered this encounter  Medications  . methylPREDNISolone acetate (DEPO-MEDROL) injection 80 mg  . methylPREDNISolone acetate (DEPO-MEDROL) injection 80 mg   Signed,  Alister Staver T. Dreamer Carillo, MD   Allergies as of 11/23/2016      Reactions   Methotrexate Derivatives Other (See Comments)   Elevated Liver Functions and Bruising   Simponi [golimumab] Rash      Medication List        Accurate as of 11/23/16  3:00 PM. Always use your most recent med list.          ferrous sulfate 325 (65  FE) MG tablet Take 1 tablet (325 mg total) by mouth 3 (three) times daily after meals.   fish oil-omega-3 fatty acids 1000 MG capsule Take 1 g by mouth as needed. Takes for joint pain   HYDROcodone-acetaminophen 5-325 MG tablet Commonly known as:  NORCO Take 1 tablet by mouth every 6 (six) hours as needed for moderate pain.   lisinopril-hydrochlorothiazide 10-12.5 MG tablet Commonly known as:  PRINZIDE,ZESTORETIC Take 1 tablet by mouth daily.   multivitamin tablet Take 1 tablet by mouth daily.   Turmeric 500 MG Tabs Take 2 tablets by mouth daily.

## 2016-11-24 ENCOUNTER — Other Ambulatory Visit: Payer: Self-pay

## 2016-11-24 ENCOUNTER — Encounter: Payer: Self-pay | Admitting: Family Medicine

## 2016-11-24 MED ORDER — TRAMADOL HCL 50 MG PO TABS: 50.0000 mg | ORAL_TABLET | Freq: Four times a day (QID) | ORAL | 0 refills | Status: DC

## 2016-11-24 NOTE — Progress Notes (Signed)
Erin Bass/C Hydrocodone-APAP/Restart Tramadol 50mg  1q6h prn #60 per 30d/printed for TA to sign per her req/thx dmf

## 2016-11-25 ENCOUNTER — Encounter: Payer: Self-pay | Admitting: Family Medicine

## 2016-11-25 NOTE — Addendum Note (Signed)
Addended by: Hannah Beat on: 11/25/2016 01:43 PM   Modules accepted: Orders

## 2016-12-21 DIAGNOSIS — R7611 Nonspecific reaction to tuberculin skin test without active tuberculosis: Secondary | ICD-10-CM | POA: Diagnosis not present

## 2016-12-21 DIAGNOSIS — Z79899 Other long term (current) drug therapy: Secondary | ICD-10-CM | POA: Diagnosis not present

## 2016-12-21 DIAGNOSIS — M7989 Other specified soft tissue disorders: Secondary | ICD-10-CM | POA: Diagnosis not present

## 2016-12-21 DIAGNOSIS — M25531 Pain in right wrist: Secondary | ICD-10-CM | POA: Diagnosis not present

## 2016-12-21 DIAGNOSIS — M79642 Pain in left hand: Secondary | ICD-10-CM | POA: Diagnosis not present

## 2016-12-21 DIAGNOSIS — M069 Rheumatoid arthritis, unspecified: Secondary | ICD-10-CM | POA: Diagnosis not present

## 2016-12-21 DIAGNOSIS — M0579 Rheumatoid arthritis with rheumatoid factor of multiple sites without organ or systems involvement: Secondary | ICD-10-CM | POA: Diagnosis not present

## 2016-12-21 DIAGNOSIS — M79641 Pain in right hand: Secondary | ICD-10-CM | POA: Diagnosis not present

## 2016-12-21 DIAGNOSIS — M25532 Pain in left wrist: Secondary | ICD-10-CM | POA: Diagnosis not present

## 2016-12-21 DIAGNOSIS — Z227 Latent tuberculosis: Secondary | ICD-10-CM | POA: Insufficient documentation

## 2016-12-31 ENCOUNTER — Other Ambulatory Visit: Payer: Self-pay | Admitting: Family Medicine

## 2017-01-01 MED ORDER — TRAMADOL HCL 50 MG PO TABS: 50.0000 mg | ORAL_TABLET | Freq: Four times a day (QID) | ORAL | 2 refills | Status: DC

## 2017-01-04 NOTE — Telephone Encounter (Signed)
I faxed this already/I have now called in to pharm and left on VM/thx dmf

## 2017-02-11 ENCOUNTER — Ambulatory Visit (INDEPENDENT_AMBULATORY_CARE_PROVIDER_SITE_OTHER): Payer: BLUE CROSS/BLUE SHIELD | Admitting: Family Medicine

## 2017-02-11 VITALS — BP 136/76 | HR 80 | Temp 98.4°F | Ht 61.0 in | Wt 184.8 lb

## 2017-02-11 DIAGNOSIS — I10 Essential (primary) hypertension: Secondary | ICD-10-CM

## 2017-02-11 DIAGNOSIS — R059 Cough, unspecified: Secondary | ICD-10-CM | POA: Insufficient documentation

## 2017-02-11 DIAGNOSIS — R05 Cough: Secondary | ICD-10-CM

## 2017-02-11 DIAGNOSIS — M069 Rheumatoid arthritis, unspecified: Secondary | ICD-10-CM

## 2017-02-11 MED ORDER — TRAMADOL HCL 50 MG PO TABS: 50.0000 mg | ORAL_TABLET | Freq: Four times a day (QID) | ORAL | 2 refills | Status: DC

## 2017-02-11 MED ORDER — HYDROCHLOROTHIAZIDE 25 MG PO TABS: 25.0000 mg | ORAL_TABLET | Freq: Every day | ORAL | 3 refills | Status: DC

## 2017-02-11 MED ORDER — METHYLPREDNISOLONE ACETATE 40 MG/ML IJ SUSP
40.0000 mg | Freq: Once | INTRAMUSCULAR | Status: AC
  Administered 2017-02-11: 40 mg via INTRAMUSCULAR

## 2017-02-11 NOTE — Patient Instructions (Signed)
Great to see you.  Please stop taking your lisinopril-hctz. Start taking hctz 25 mg daily.  Please keep checking your blood pressure and keep me updated.

## 2017-02-11 NOTE — Assessment & Plan Note (Signed)
>  25 minutes spent in face to face time with patient, >50% spent in counselling or coordination of care IM depo medrol given to pt as this has helped in the past. Advised that she call her insurance company about finding a way to get Humira covered. The patient indicates understanding of these issues and agrees with the plan.

## 2017-02-11 NOTE — Addendum Note (Signed)
Addended by: Lerry Liner on: 02/11/2017 01:34 PM   Modules accepted: Orders

## 2017-02-11 NOTE — Progress Notes (Signed)
Subjective:   [name], female    DOB: [birth year], 57 y.o.   MRN: [id number]  Erin Bass is a pleasant 57 y.o. year old female who presents to clinic today with Rheumatoid Arthritis (Patient is here today to discuss Rheumatoid Arthritis.  Declines the flu shot but would like to get the Tdap today.  She is having a flare-up and is having quite a bit of pain in her knees and has swelling.  She was given prednisone but it is ineffective.  Insurance will not cover Humera injections but the 2 shots she did have of it she could not tell if it was beneficial.)  on 02/11/2017  HPI:  RA- diagnosed in 2013 and has been to numerous rheumatologists during that time. Also has had adverse rxs to several rxs including MTX , leflunomide, and Simponi. Most recently saw Dr. Allena Katz on 12/21/16. Note reviewed. He started her on humira sq every 2 weeks but insurance will not cover this.    Also placed on plaquenil.  Has seen Dr. Patsy Lager for this a few times- he placed her on prednisone but she feels this wasn't as helpful as it was in the past.  Tramadol and alleve seem to give her the only relief she gets although still in a lot of pain.  Has had a dry cough for months.  Current Outpatient Medications on File Prior to Visit  Medication Sig Dispense Refill  . ferrous sulfate 325 (65 FE) MG tablet Take 1 tablet (325 mg total) by mouth 3 (three) times daily after meals. (Patient taking differently: Take 325 mg by mouth daily with breakfast. )  3  . fish oil-omega-3 fatty acids 1000 MG capsule Take 1 g by mouth as needed. Takes for joint pain    . lisinopril-hydrochlorothiazide (PRINZIDE,ZESTORETIC) 10-12.5 MG tablet Take 1 tablet by mouth daily. 90 tablet 2  . Multiple Vitamin (MULTIVITAMIN) tablet Take 1 tablet by mouth daily.    . Turmeric 500 MG TABS Take 2 tablets by mouth daily.     No current facility-administered medications on file prior to visit.     Allergies  Allergen  Reactions  . Leflunomide Other (See Comments)    Rash  Incontinence   . Methotrexate Derivatives Other (See Comments)    Elevated Liver Functions and Bruising  . Simponi [Golimumab] Rash    Past Medical History:  Diagnosis Date  . Anemia   . Arthritis   . HTN (hypertension)     Past Surgical History:  Procedure Laterality Date  . ABDOMINAL HYSTERECTOMY     Partial- Uterus only  . bone spur - removal    . CESAREAN SECTION     x2  . PARTIAL HYSTERECTOMY  1/06   fibroids; ovaries left in   . TOTAL HIP ARTHROPLASTY Right 12/20/2012   Procedure: RIGHT TOTAL HIP ARTHROPLASTY ANTERIOR APPROACH;  Surgeon: Shelda Pal, MD;  Location: [facility];  Service: Orthopedics;  Laterality: Right;  . TUBAL LIGATION      Family History  Problem Relation Age of Onset  . Depression Unknown        both sides of family   . Anemia Mother     Social History   Socioeconomic History  . Marital status: Married    Spouse name: Not on file  . Number of children: Not on file  . Years of education: Not on file  . Highest education level: Not on file  Social Needs  . Financial  resource strain: Not on file  . Food insecurity - worry: Not on file  . Food insecurity - inability: Not on file  . Transportation needs - medical: Not on file  . Transportation needs - non-medical: Not on file  Occupational History  . Not on file  Tobacco Use  . Smoking status: Never Smoker  . Smokeless tobacco: Never Used  Substance and Sexual Activity  . Alcohol use: Yes    Comment: occassionally  . Drug use: No  . Sexual activity: Not on file  Other Topics Concern  . Not on file  Social History Narrative   Married; 2 children, 1 at home.    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   Review of Systems  Respiratory: Positive for cough. Negative for apnea,  choking, chest tightness, shortness of breath, wheezing and stridor.   Musculoskeletal: Positive for arthralgias.  All other systems reviewed and are negative.      Objective:    BP 136/76 (BP Location: Right Arm, Patient Position: Sitting, Cuff Size: Large)   Pulse 80   Temp 98.4 F (36.9 C) (Oral)   Ht 5\' 1"  (1.549 m)   Wt 184 lb 12.8 oz (83.8 kg)   SpO2 93%   BMI 34.92 kg/m    Physical Exam  Constitutional: She is oriented to person, place, and time. She appears well-developed and well-nourished. No distress.  HENT:  Head: Normocephalic and atraumatic.  Eyes: Conjunctivae are normal.  Cardiovascular: Normal rate.  Pulmonary/Chest: Effort normal.  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 No cranial nerve deficit.  Skin: Skin is warm and dry. She is not diaphoretic.  Psychiatric: She has a normal mood and affect. Her behavior is normal. Judgment and thought content normal.  Nursing note and vitals reviewed.         Assessment & Plan:   Rheumatoid arthritis involving multiple joints (HCC) No Follow-up on file.

## 2017-02-11 NOTE — Assessment & Plan Note (Signed)
?   Due to ACEI. D/c Prinzide. Start HCTZ 25 mg daily. She will check BP at home and keep me updated. The patient indicates understanding of these issues and agrees with the plan.

## 2017-02-25 ENCOUNTER — Encounter: Payer: Self-pay | Admitting: Family Medicine

## 2017-03-10 ENCOUNTER — Ambulatory Visit: Payer: BLUE CROSS/BLUE SHIELD | Admitting: Family Medicine

## 2017-03-10 ENCOUNTER — Encounter: Payer: Self-pay | Admitting: Family Medicine

## 2017-03-10 VITALS — BP 134/76 | HR 67 | Temp 98.7°F | Ht 61.0 in | Wt 187.0 lb

## 2017-03-10 DIAGNOSIS — M069 Rheumatoid arthritis, unspecified: Secondary | ICD-10-CM

## 2017-03-10 NOTE — Assessment & Plan Note (Signed)
Acute on chronic right knee pain. Unclear if his associated with her flare of rheumatoid. She has started Humira in February and has not reached its therapeutic effects quite yet. Does have degenerative changes throughout the knee. Has either debris or crystals within the effusion - Injection and aspiration today - Sent for cell count and crystals - Counseled on ice and compression - Can follow-up in 4-6 weeks could consider viscous supplementation

## 2017-03-10 NOTE — Progress Notes (Signed)
Erin Bass - 57 y.o. female MRN [id number]  Date of birth: Jan 01, 1961  SUBJECTIVE:  Including CC & ROS.  Chief Complaint  Patient presents with  . Right knee pain    Erin Bass is a 57 y.o. female that is presenting with right knee pain.  Pain is chronic in nature, increasing over the past year. Pain is worse sitting and standing. Described constant ache. Located in the anterior aspect radiates laterally. Admits swelling and tenderness present. She has received previous injections, with no improvement. She has been using a brace, heat/ice therapy and prednisone. She has been using a cane to ambulate. Denies previous injury.      Review of Systems  Constitutional: Negative for fever.  Respiratory: Negative for shortness of breath.   Cardiovascular: Negative for chest pain.  Gastrointestinal: Negative for abdominal pain.  Musculoskeletal: Positive for arthralgias and joint swelling.  Skin: Negative for color change.  Neurological: Negative for weakness.  Hematological: Negative for adenopathy.  Psychiatric/Behavioral: Negative for agitation.    HISTORY: Past Medical, Surgical, Social, and Family History Reviewed & Updated per EMR.   Pertinent Historical Findings include:  Past Medical History:  Diagnosis Date  . Anemia   . Arthritis   . HTN (hypertension)     Past Surgical History:  Procedure Laterality Date  . ABDOMINAL HYSTERECTOMY     Partial- Uterus only  . bone spur - removal    . CESAREAN SECTION     x2  . PARTIAL HYSTERECTOMY  1/06   fibroids; ovaries left in   . TOTAL HIP ARTHROPLASTY Right 12/20/2012   Procedure: RIGHT TOTAL HIP ARTHROPLASTY ANTERIOR APPROACH;  Surgeon: Shelda Pal, MD;  Location: [facility];  Service: Orthopedics;  Laterality: Right;  . TUBAL LIGATION      Allergies  Allergen Reactions  . Leflunomide Other (See Comments)    Rash  Incontinence   . Methotrexate Derivatives Other (See Comments)    Elevated Liver Functions and Bruising    . Simponi [Golimumab] Rash    Family History  Problem Relation Age of Onset  . Depression Unknown        both sides of family   . Anemia Mother      Social History   Socioeconomic History  . Marital status: Married    Spouse name: Not on file  . Number of children: Not on file  . Years of education: Not on file  . Highest education level: Not on file  Social Needs  . Financial resource strain: Not on file  . Food insecurity - worry: Not on file  . Food insecurity - inability: Not on file  . Transportation needs - medical: Not on file  . Transportation needs - non-medical: Not on file  Occupational History  . Not on file  Tobacco Use  . Smoking status: Never Smoker  . Smokeless tobacco: Never Used  Substance and Sexual Activity  . Alcohol use: Yes    Comment: occassionally  . Drug use: No  . Sexual activity: Not on file  Other Topics Concern  . Not on file  Social History Narrative   Married; 2 children, 1 at home.    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     PHYSICAL EXAM:  VS: BP 134/76 (BP Location: Left Arm, Patient Position: Sitting, Cuff Size: Normal)   Pulse 67   Temp 98.7 F (37.1 C) (Oral)   Ht 5\' 1"  (1.549 m)   Wt 187  lb (84.8 kg)   SpO2 98%   BMI 35.33 kg/m  Physical Exam Gen: NAD, alert, cooperative with exam, well-appearing ENT: normal lips, normal nasal mucosa,  Eye: normal EOM, normal conjunctiva and lids CV:  no edema, +2 pedal pulses   Resp: no accessory muscle use, non-labored,  Skin: no rashes, no areas of induration  Neuro: normal tone, normal sensation to touch Psych:  normal insight, alert and oriented MSK:  Right knee: Mild effusion  Tender to palpation over the superior aspect of the knee, no overt redness, some chest palpation of the medial and lateral joint line. Limited flexion and extension secondary to pain Negative McMurray's test. Walking with a limp and a single-point cane. neurovascularly intact    Limited ultrasound: Right knee:  Effusion occurring that has degrees or crystals contained within it Normal Quad and patellar tendon Some joint space narrowing of the medial compartment and outpouching of the medial meniscus.  Summary: Effusion and degenerative changes of the medial joint space  Ultrasound and interpretation by Clare Gandy, MD   Aspiration/Injection Procedure Note [name] 1960-09-18  Procedure: Aspiration and Injection Indications: Right knee pain and effusion  Procedure Details Consent: Risks of procedure as well as the alternatives and risks of each were explained to the (patient/caregiver).  Consent for procedure obtained. Time Out: Verified patient identification, verified procedure, site/side was marked, verified correct patient position, special equipment/implants available, medications/allergies/relevent history reviewed, required imaging and test results available.  Performed.  The area was cleaned with iodine and alcohol swabs.    The right knee joint was aspirated and injected. 4 mL of 1% lidocaine without epinephrine was used to anesthetize the skin and the tract on a 25-gauge 1.5" needle. An 18-gauge 1.5" needle was then inserted on a 60 mL syringe and aspiration took place. Injection took place with 1 cc's of 40 mg Depomedrol and 4 cc's of 0.25% benzocaine.  Ultrasound was used. Images were obtained in Long views showing the injection.    Amount of Fluid Aspirated: 35mL Character of Fluid: Cloudy and yellow Fluid was sent QAS:TMHD count and crystal identification A sterile dressing was applied.  Patient did tolerate procedure well.     ASSESSMENT & PLAN:   Rheumatoid arthritis involving multiple joints (HCC) Acute on chronic right knee pain. Unclear if his associated with her flare of rheumatoid. She has started Humira in February and has not reached its therapeutic effects quite yet. Does have degenerative changes throughout the knee. Has  either debris or crystals within the effusion - Injection and aspiration today - Sent for cell count and crystals - Counseled on ice and compression - Can follow-up in 4-6 weeks could consider viscous supplementation

## 2017-03-10 NOTE — Patient Instructions (Signed)
Take tylenol 650 mg three times a day is the best evidence based medicine we have for arthritis.   Tart cherry juice can help with inflammation  Glucosamine sulfate 750mg  twice a day is a supplement that has been shown to help moderate to severe arthritis.  Vitamin D 2000 IU daily  Fish oil 2 grams daily.   Tumeric 500mg  twice daily.   Capsaicin topically up to four times a day may also help with pain.  It's important that you continue to stay active.   Consider physical therapy to strengthen muscles around the joint that hurts to take pressure off of the joint itself.  Shoe inserts with good arch support may be helpful.     Water aerobics and cycling with low resistance are the best two types of exercise for arthritis.  Follow up with me in 4-6 weeks

## 2017-03-12 ENCOUNTER — Telehealth: Payer: Self-pay | Admitting: Family Medicine

## 2017-03-12 LAB — SYNOVIAL CELL COUNT + DIFF, W/ CRYSTALS
BASOPHILS, %: 0 %
Eosinophils-Synovial: 0 % (ref 0–2)
Lymphocytes-Synovial Fld: 4 % (ref 0–74)
Monocyte/Macrophage: 14 % (ref 0–69)
NEUTROPHIL, SYNOVIAL: 82 % — AB (ref 0–24)
SYNOVIOCYTES, %: 0 % (ref 0–15)
WBC, Synovial: 1238 cells/uL — ABNORMAL HIGH (ref <150)

## 2017-03-12 LAB — URIC ACID, SYNOVIAL FLUID: URIC ACID, SYNOVIAL FLUID: 4.8 mg/dL (ref <6.0)

## 2017-03-12 NOTE — Telephone Encounter (Signed)
Left VM for patient. If she calls back please have her speak with a nurse/CMA and inform that her food did not demonstrate any gout presents. It did not show any infection. It did show inflammatory change which was expected. We could try different kinds of injections going forward if she would like to do that. Those injections could be the hyaluronic acid injections instead of steroid injections.  If any questions then please take the best time and phone number to call and I will try to call her back.   Myra Rude, MD [place] [facility] 03/12/2017, 12:52 PM

## 2017-04-05 DIAGNOSIS — M0579 Rheumatoid arthritis with rheumatoid factor of multiple sites without organ or systems involvement: Secondary | ICD-10-CM | POA: Diagnosis not present

## 2017-04-05 DIAGNOSIS — Z79899 Other long term (current) drug therapy: Secondary | ICD-10-CM | POA: Diagnosis not present

## 2017-04-26 ENCOUNTER — Ambulatory Visit: Payer: Self-pay

## 2017-04-26 ENCOUNTER — Encounter: Payer: Self-pay | Admitting: Family Medicine

## 2017-04-26 ENCOUNTER — Ambulatory Visit: Payer: BLUE CROSS/BLUE SHIELD | Admitting: Family Medicine

## 2017-04-26 VITALS — BP 134/78 | HR 93 | Temp 98.4°F | Ht 61.0 in

## 2017-04-26 DIAGNOSIS — M25562 Pain in left knee: Secondary | ICD-10-CM

## 2017-04-26 DIAGNOSIS — M170 Bilateral primary osteoarthritis of knee: Secondary | ICD-10-CM | POA: Insufficient documentation

## 2017-04-26 NOTE — Assessment & Plan Note (Signed)
Factors contributing are her OA and RA. No injury.  - injection  - counseled on compression and ice  - counseled on HEP  - if no improvement consider visco.

## 2017-04-26 NOTE — Progress Notes (Signed)
Erin Bass - 57 y.o. female MRN [id number]  Date of birth: 09/08/1960  SUBJECTIVE:  Including CC & ROS.  Chief Complaint  Patient presents with  . Left knee pain    Erin Bass is a 57 y.o. female that is presenting with left knee pain. Pain is ongoing for three weeks. She received previous injections in her right knee with improvement. Pain is located on the lateral joint line.   Denies injury or surgeries. Admits to swelling and tenderness over the SPP. She has been taking tylenol and keeping it compressed with an ace bandage. Pain is mild to severe upon flexion and extension. Walking and standing increase pain. She uses a cane to ambulate.  She recently started taking Plaquenil and Humira for her Rheumatoid arthritis.  Independent review of the left knee x-ray from/26 shows mild to moderate medial joint space narrowing.   Review of Systems  Constitutional: Negative for fever.  HENT: Negative for congestion.   Respiratory: Negative for shortness of breath.   Cardiovascular: Negative for chest pain.  Gastrointestinal: Negative for abdominal pain.  Musculoskeletal: Positive for arthralgias and joint swelling.  Skin: Negative for color change.  Allergic/Immunologic: Negative for immunocompromised state.  Neurological: Negative for weakness.  Hematological: Negative for adenopathy.  Psychiatric/Behavioral: Negative for agitation.    HISTORY: Past Medical, Surgical, Social, and Family History Reviewed & Updated per EMR.   Pertinent Historical Findings include:  Past Medical History:  Diagnosis Date  . Anemia   . Arthritis   . HTN (hypertension)     Past Surgical History:  Procedure Laterality Date  . ABDOMINAL HYSTERECTOMY     Partial- Uterus only  . bone spur - removal    . CESAREAN SECTION     x2  . PARTIAL HYSTERECTOMY  1/06   fibroids; ovaries left in   . TOTAL HIP ARTHROPLASTY Right 12/20/2012   Procedure: RIGHT TOTAL HIP ARTHROPLASTY ANTERIOR APPROACH;  Surgeon:  Shelda Pal, MD;  Location: [facility];  Service: Orthopedics;  Laterality: Right;  . TUBAL LIGATION      Allergies  Allergen Reactions  . Leflunomide Other (See Comments)    Rash  Incontinence   . Methotrexate Derivatives Other (See Comments)    Elevated Liver Functions and Bruising  . Simponi [Golimumab] Rash    Family History  Problem Relation Age of Onset  . Depression Unknown        both sides of family   . Anemia Mother      Social History   Socioeconomic History  . Marital status: Married    Spouse name: Not on file  . Number of children: Not on file  . Years of education: Not on file  . Highest education level: Not on file  Occupational History  . Not on file  Social Needs  . Financial resource strain: Not on file  . Food insecurity:    Worry: Not on file    Inability: Not on file  . Transportation needs:    Medical: Not on file    Non-medical: Not on file  Tobacco Use  . Smoking status: Never Smoker  . Smokeless tobacco: Never Used  Substance and Sexual Activity  . Alcohol use: Yes    Comment: occassionally  . Drug use: No  . Sexual activity: Not on file  Lifestyle  . Physical activity:    Days per week: Not on file    Minutes per session: Not on file  . Stress: Not on file  Relationships  . Social connections:    Talks on phone: Not on file    Gets together: Not on file    Attends religious service: Not on file    Active member of club or organization: Not on file    Attends meetings of clubs or organizations: Not on file    Relationship status: Not on file  . Intimate partner violence:    Fear of current or ex partner: Not on file    Emotionally abused: Not on file    Physically abused: Not on file    Forced sexual activity: Not on file  Other Topics Concern  . Not on file  Social History Narrative   Married; 2 children, 1 at home.    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     PHYSICAL EXAM:  VS: BP 134/78 (BP Location:  Right Arm, Patient Position: Sitting, Cuff Size: Normal)   Pulse 93   Temp 98.4 F (36.9 C) (Oral)   Ht 5\' 1"  (1.549 m)   SpO2 93%   BMI 35.33 kg/m  Physical Exam Gen: NAD, alert, cooperative with exam, well-appearing ENT: normal lips, normal nasal mucosa,  Eye: normal EOM, normal conjunctiva and lids CV:  no edema, +2 pedal pulses   Resp: no accessory muscle use, non-labored,  Skin: no rashes, no areas of induration  Neuro: normal tone, normal sensation to touch Psych:  normal insight, alert and oriented MSK:  Left knee:  Effusion noted  Normal ROM  Normal strength to resistance  No instability  Negative McMurray's test  Walking with single point cane.  No redness  Neurovascularly intact.   Limited ultrasound: left knee:  Effusion noted in the SPP. Has some internal changes to suggest synovitis vs gout  Normal QT and PT  Medial joint space narrowing   Summary: effusion vs synovitis. Degenerative changes in medial compartment    Ultrasound and interpretation by , MD    Aspiration/Injection Procedure Note Erin Bass 1960-03-10  Procedure: Injection Indications: left knee pain   Procedure Details Consent: Risks of procedure as well as the alternatives and risks of each were explained to the (patient/caregiver).  Consent for procedure obtained. Time Out: Verified patient identification, verified procedure, site/side was marked, verified correct patient position, special equipment/implants available, medications/allergies/relevent history reviewed, required imaging and test results available.  Performed.  The area was cleaned with iodine and alcohol swabs.    The left knee superiorlateral SPP was injected using 1 cc's of 40 mg/mL kenalog and 4 cc's of 0.25% bupivacaine with a 22 1 1/2" needle.  Ultrasound was used. Images were obtained in Long views showing the injection.    A sterile dressing was applied.  Patient did tolerate procedure  well.    ASSESSMENT & PLAN:   Acute pain of left knee Factors contributing are her OA and RA. No injury.  - injection  - counseled on compression and ice  - counseled on HEP  - if no improvement consider visco.

## 2017-04-26 NOTE — Patient Instructions (Signed)
Good to see you Please try to ice your knee.  Compression will help  Please try the exercises that I have provided.  Please follow up with me in 4-6 weeks if your pain returns.

## 2017-06-23 DIAGNOSIS — R7611 Nonspecific reaction to tuberculin skin test without active tuberculosis: Secondary | ICD-10-CM | POA: Diagnosis not present

## 2017-06-23 DIAGNOSIS — Z79899 Other long term (current) drug therapy: Secondary | ICD-10-CM | POA: Diagnosis not present

## 2017-06-23 DIAGNOSIS — M0579 Rheumatoid arthritis with rheumatoid factor of multiple sites without organ or systems involvement: Secondary | ICD-10-CM | POA: Diagnosis not present

## 2017-06-23 DIAGNOSIS — M153 Secondary multiple arthritis: Secondary | ICD-10-CM | POA: Diagnosis not present

## 2017-08-19 ENCOUNTER — Encounter: Payer: Self-pay | Admitting: Family Medicine

## 2017-08-24 ENCOUNTER — Encounter: Payer: Self-pay | Admitting: Family Medicine

## 2017-08-30 ENCOUNTER — Other Ambulatory Visit: Payer: Self-pay

## 2017-08-30 DIAGNOSIS — E785 Hyperlipidemia, unspecified: Secondary | ICD-10-CM

## 2017-08-30 DIAGNOSIS — I10 Essential (primary) hypertension: Secondary | ICD-10-CM

## 2017-08-30 NOTE — Progress Notes (Signed)
PEC-I Sent MyChart message to pt advising that she will need a fasting Lipid Panel & CMP in order for her form to be completed/future orders created/she just needs to be scheduled/thx dmf

## 2017-09-01 ENCOUNTER — Other Ambulatory Visit (INDEPENDENT_AMBULATORY_CARE_PROVIDER_SITE_OTHER): Payer: BLUE CROSS/BLUE SHIELD

## 2017-09-01 DIAGNOSIS — E785 Hyperlipidemia, unspecified: Secondary | ICD-10-CM

## 2017-09-01 DIAGNOSIS — I10 Essential (primary) hypertension: Secondary | ICD-10-CM

## 2017-09-01 LAB — COMPREHENSIVE METABOLIC PANEL
ALT: 12 U/L (ref 0–35)
AST: 13 U/L (ref 0–37)
Albumin: 4 g/dL (ref 3.5–5.2)
Alkaline Phosphatase: 45 U/L (ref 39–117)
BUN: 23 mg/dL (ref 6–23)
CHLORIDE: 102 meq/L (ref 96–112)
CO2: 29 mEq/L (ref 19–32)
CREATININE: 0.84 mg/dL (ref 0.40–1.20)
Calcium: 9.7 mg/dL (ref 8.4–10.5)
GFR: 89.68 mL/min (ref >60.00)
GLUCOSE: 90 mg/dL (ref 70–99)
POTASSIUM: 3.7 meq/L (ref 3.5–5.1)
SODIUM: 139 meq/L (ref 135–145)
TOTAL PROTEIN: 7.2 g/dL (ref 6.0–8.3)
Total Bilirubin: 0.5 mg/dL (ref 0.2–1.2)

## 2017-09-01 LAB — LIPID PANEL
CHOLESTEROL: 286 mg/dL — AB (ref 0–200)
HDL: 117.9 mg/dL (ref >39.00)
LDL CALC: 152 mg/dL — AB (ref 0–99)
NONHDL: 168.15
Total CHOL/HDL Ratio: 2
Triglycerides: 80 mg/dL (ref 0.0–149.0)
VLDL: 16 mg/dL (ref 0.0–40.0)

## 2017-09-06 ENCOUNTER — Encounter: Payer: Self-pay | Admitting: Family Medicine

## 2017-09-13 ENCOUNTER — Encounter: Payer: Self-pay | Admitting: Family Medicine

## 2017-09-13 ENCOUNTER — Ambulatory Visit: Payer: BLUE CROSS/BLUE SHIELD | Admitting: Family Medicine

## 2017-09-13 VITALS — BP 130/90 | HR 84 | Temp 98.4°F | Ht 61.0 in | Wt 193.6 lb

## 2017-09-13 DIAGNOSIS — M069 Rheumatoid arthritis, unspecified: Secondary | ICD-10-CM | POA: Diagnosis not present

## 2017-09-13 MED ORDER — PREDNISONE 5 MG PO TABS: 5.0000 mg | ORAL_TABLET | Freq: Every day | ORAL | 3 refills | Status: DC

## 2017-09-13 MED ORDER — TRAMADOL HCL 50 MG PO TABS: 50.0000 mg | ORAL_TABLET | Freq: Four times a day (QID) | ORAL | 2 refills | Status: DC

## 2017-09-13 NOTE — Patient Instructions (Signed)
Great to see you. Start taking prednisone 5 mg daily with breakfast and come see me in 2 months.

## 2017-09-13 NOTE — Assessment & Plan Note (Signed)
>  25 minutes spent in face to face time with patient, >50% spent in counselling or coordination of care Very complicated situation.  She truly has tried and has been intolerant to several rxs and her quality of life has suffered tremendously due to poorly controlled RA and the pain and immobility she is dealing with.  Discussed risks of long term steroid use and how bursts can lead to adrenal failure.  She is aware of other risks as well which we discussed today of long term steroid use, such as but not limited to elevated blood sugar, weight gain, osteoporosis.  We agreed to a trial of low dose, daily prednisone,5 mg daily, not a burst but a scheduled daily medication.  She will follow up with me in 2 months. Tramadol as needed for severe pain. She will minimize NSAID use now that she is taking prednisone daily. Follow up in 2 months. The patient indicates understanding of these issues and agrees with the plan.

## 2017-09-13 NOTE — Progress Notes (Signed)
Subjective:   [name], female    DOB: [birth year], 57 y.o.   MRN: [id number]  Erin Bass is a pleasant 57 y.o. year old female who presents to clinic today with Edema (pt c/o swelling in her knees and thighs, pt sts she think it is stimulating from the Humira and some liver failure, pt would like to start back on tramadol for her knee and thigh pain, pt described pain as tight sensations and pressure, pain 8/10)  on 09/13/2017  HPI:  RA- diagnosed in 2013 and has been to numerous rheumatologists during that time. Also has had adverse rxs to several rxs including MTX , leflunomide, and Simponi. Now more recently humira and enbrel.  Has been on 6 total medications. Most recently saw Dr. Allena Katz on 06/23/17.  Note reviewed. CRP elevated. Currently not taking anything.  Enbrel caused side effects too severe- caused elevated LFTs, rashes, hair loss.  Dr. Jordan Likes has seen her for knee injections.  Tramadol and alleve seem to give her the only relief she gets although still in a lot of pain.  She is tearful and understandably frustrated.  Quality of life is now poor because of this.  Copay to see rheumatology is high and every rx she has tried was either too costly or had an adverse reaction.  She is about prednisone as that seems to be the only rx she can tolerate.   Current Outpatient Medications on File Prior to Visit  Medication Sig Dispense Refill  . ferrous sulfate 325 (65 FE) MG tablet Take 1 tablet (325 mg total) by mouth 3 (three) times daily after meals. (Patient taking differently: Take 325 mg by mouth daily with breakfast. )  3  . fish oil-omega-3 fatty acids 1000 MG capsule Take 1 g by mouth as needed. Takes for joint pain    . hydrochlorothiazide (HYDRODIURIL) 25 MG tablet Take 1 tablet (25 mg total) by mouth daily. 90 tablet 3  . Multiple Vitamin (MULTIVITAMIN) tablet Take 1 tablet by mouth daily.    . traMADol (ULTRAM) 50 MG tablet Take 1 tablet (50 mg  total) by mouth every 6 (six) hours. 60 tablet 2  . Turmeric 500 MG TABS Take 2 tablets by mouth daily.    . Adalimumab (HUMIRA PEN) 40 MG/0.4ML PNKT Inject into the skin.    . hydroxychloroquine (PLAQUENIL) 200 MG tablet Take by mouth.     No current facility-administered medications on file prior to visit.     Allergies  Allergen Reactions  . Leflunomide Other (See Comments)    Rash  Incontinence   . Adalimumab Swelling  . Methotrexate Derivatives Other (See Comments)    Elevated Liver Functions and Bruising  . Simponi [Golimumab] Rash    Past Medical History:  Diagnosis Date  . Anemia   . Arthritis   . HTN (hypertension)     Past Surgical History:  Procedure Laterality Date  . ABDOMINAL HYSTERECTOMY     Partial- Uterus only  . bone spur - removal    . CESAREAN SECTION     x2  . PARTIAL HYSTERECTOMY  1/06   fibroids; ovaries left in   . TOTAL HIP ARTHROPLASTY Right 12/20/2012   Procedure: RIGHT TOTAL HIP ARTHROPLASTY ANTERIOR APPROACH;  Surgeon: Shelda Pal, MD;  Location: [facility];  Service: Orthopedics;  Laterality: Right;  . TUBAL LIGATION      Family History  Problem Relation Age of Onset  . Depression Unknown  both sides of family   . Anemia Mother     Social History   Socioeconomic History  . Marital status: Married    Spouse name: Not on file  . Number of children: Not on file  . Years of education: Not on file  . Highest education level: Not on file  Occupational History  . Not on file  Social Needs  . Financial resource strain: Not on file  . Food insecurity:    Worry: Not on file    Inability: Not on file  . Transportation needs:    Medical: Not on file    Non-medical: Not on file  Tobacco Use  . Smoking status: Never Smoker  . Smokeless tobacco: Never Used  Substance and Sexual Activity  . Alcohol use: Yes    Comment: occassionally  . Drug use: No  . Sexual activity: Not on file  Lifestyle  . Physical activity:    Days per  week: Not on file    Minutes per session: Not on file  . Stress: Not on file  Relationships  . Social connections:    Talks on phone: Not on file    Gets together: Not on file    Attends religious service: Not on file    Active member of club or organization: Not on file    Attends meetings of clubs or organizations: Not on file    Relationship status: Not on file  . Intimate partner violence:    Fear of current or ex partner: Not on file    Emotionally abused: Not on file    Physically abused: Not on file    Forced sexual activity: Not on file  Other Topics Concern  . Not on file  Social History Narrative   Married; 2 children, 1 at home.    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   Review of Systems  Constitutional: Positive for fatigue.  Respiratory: Negative.   Cardiovascular: Negative.   Gastrointestinal: Negative.   Endocrine: Negative.   Musculoskeletal: Positive for arthralgias, gait problem, myalgias, neck pain and neck stiffness.  Skin: Positive for rash.  Allergic/Immunologic: Negative.   Hematological: Bruises/bleeds easily.  All other systems reviewed and are negative.      Objective:    BP 130/90 (BP Location: Right Arm, Patient Position: Sitting, Cuff Size: Normal)   Pulse 84   Temp 98.4 F (36.9 C) (Oral)   Ht 5\' 1"  (1.549 m)   Wt 193 lb 9.6 oz (87.8 kg)   SpO2 98%   BMI 36.58 kg/m    Physical Exam  Constitutional: She is oriented to person, place, and time. She appears well-developed and well-nourished. No distress.  HENT:  Head: Normocephalic and atraumatic.  Eyes: EOM are normal.  Cardiovascular: Normal rate.  Pulmonary/Chest: Effort normal.  Musculoskeletal:       Right shoulder: She exhibits normal range of motion, no swelling, no effusion, no deformity and no pain.  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 Walks with a cane, favors left side.  Neurological: She is alert and oriented to person, place, and time.  Skin: Skin is warm. She is not diaphoretic.  Psychiatric: She has a normal mood and affect. Her behavior is normal. Judgment and thought content normal.  Nursing note and vitals reviewed.  Assessment & Plan:   No diagnosis found. No follow-ups on file.

## 2017-09-28 ENCOUNTER — Encounter: Payer: Self-pay | Admitting: Family Medicine

## 2017-09-28 ENCOUNTER — Ambulatory Visit (INDEPENDENT_AMBULATORY_CARE_PROVIDER_SITE_OTHER): Payer: BLUE CROSS/BLUE SHIELD

## 2017-09-28 ENCOUNTER — Ambulatory Visit: Payer: BLUE CROSS/BLUE SHIELD | Admitting: Family Medicine

## 2017-09-28 VITALS — BP 134/74 | HR 88 | Ht 61.0 in | Wt 193.0 lb

## 2017-09-28 DIAGNOSIS — M25562 Pain in left knee: Secondary | ICD-10-CM

## 2017-09-28 DIAGNOSIS — M170 Bilateral primary osteoarthritis of knee: Secondary | ICD-10-CM | POA: Diagnosis not present

## 2017-09-28 MED ORDER — DICLOFENAC SODIUM 2 % TD SOLN: 1.0000 "application " | Freq: Two times a day (BID) | TRANSDERMAL | 3 refills | Status: DC

## 2017-09-28 MED ORDER — NAPROXEN-ESOMEPRAZOLE 500-20 MG PO TBEC: 1.0000 | DELAYED_RELEASE_TABLET | Freq: Two times a day (BID) | ORAL | 3 refills | Status: DC

## 2017-09-28 NOTE — Progress Notes (Signed)
Erin Bass - 57 y.o. female MRN [id number]  Date of birth: Dec 28, 1960  SUBJECTIVE:  Including CC & ROS.  Chief Complaint  Patient presents with  . Follow-up    Erin Bass is a 58 y.o. female that is here today for bilateral knee pain. The pain is acute on chronic. She has noticed swelling recently. Denies any inciting event. Pain is localized to the knee. The pain is moderate. No locking or giving way. She was seen on 04/817 and received a left knee injection with some improvement in her pain. Pain is located on the lateral joint line. She discontinued Humira one month ago for her Rheumatoid Arthritis, currently taking Prednisone daily.   Independent review of the left and right from 2/26 shows moderate joint space narrowing of the medial compartment.   Review of Systems  Constitutional: Negative for fever.  HENT: Negative for congestion.   Respiratory: Negative for cough.   Cardiovascular: Negative for chest pain.  Gastrointestinal: Negative for abdominal pain.  Musculoskeletal: Positive for arthralgias, gait problem and joint swelling.  Skin: Negative for color change.  Neurological: Negative for weakness.  Hematological: Negative for adenopathy.  Psychiatric/Behavioral: Negative for agitation.    HISTORY: Past Medical, Surgical, Social, and Family History Reviewed & Updated per EMR.   Pertinent Historical Findings include:  Past Medical History:  Diagnosis Date  . Anemia   . Arthritis   . HTN (hypertension)     Past Surgical History:  Procedure Laterality Date  . ABDOMINAL HYSTERECTOMY     Partial- Uterus only  . bone spur - removal    . CESAREAN SECTION     x2  . PARTIAL HYSTERECTOMY  1/06   fibroids; ovaries left in   . TOTAL HIP ARTHROPLASTY Right 12/20/2012   Procedure: RIGHT TOTAL HIP ARTHROPLASTY ANTERIOR APPROACH;  Surgeon: Shelda Pal, MD;  Location: [facility];  Service: Orthopedics;  Laterality: Right;  . TUBAL LIGATION      Allergies  Allergen  Reactions  . Leflunomide Other (See Comments)    Rash  Incontinence   . Adalimumab Swelling  . Methotrexate Derivatives Other (See Comments)    Elevated Liver Functions and Bruising  . Simponi [Golimumab] Rash    Family History  Problem Relation Age of Onset  . Depression Unknown        both sides of family   . Anemia Mother      Social History   Socioeconomic History  . Marital status: Married    Spouse name: Not on file  . Number of children: Not on file  . Years of education: Not on file  . Highest education level: Not on file  Occupational History  . Not on file  Social Needs  . Financial resource strain: Not on file  . Food insecurity:    Worry: Not on file    Inability: Not on file  . Transportation needs:    Medical: Not on file    Non-medical: Not on file  Tobacco Use  . Smoking status: Never Smoker  . Smokeless tobacco: Never Used  Substance and Sexual Activity  . Alcohol use: Yes    Comment: occassionally  . Drug use: No  . Sexual activity: Not on file  Lifestyle  . Physical activity:    Days per week: Not on file    Minutes per session: Not on file  . Stress: Not on file  Relationships  . Social connections:    Talks on phone: Not on  file    Gets together: Not on file    Attends religious service: Not on file    Active member of club or organization: Not on file    Attends meetings of clubs or organizations: Not on file    Relationship status: Not on file  . Intimate partner violence:    Fear of current or ex partner: Not on file    Emotionally abused: Not on file    Physically abused: Not on file    Forced sexual activity: Not on file  Other Topics Concern  . Not on file  Social History Narrative   Married; 2 children, 1 at home.    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     PHYSICAL EXAM:  VS: BP 134/74   Pulse 88   Ht 5\' 1"  (1.549 m)   Wt 193 lb (87.5 kg)   SpO2 98%   BMI 36.47 kg/m  Physical Exam Gen: NAD,  alert, cooperative with exam, well-appearing ENT: normal lips, normal nasal mucosa,  Eye: normal EOM, normal conjunctiva and lids CV:  no edema, +2 pedal pulses   Resp: no accessory muscle use, non-labored,  GI: no masses or tenderness, no hernia  Skin: no rashes, no areas of induration  Neuro: normal tone, normal sensation to touch Psych:  normal insight, alert and oriented MSK:  Left and right knee:  Knee: Normal to inspection with no erythema or obvious bony abnormalities. Obvious effusion b/l  Palpation normal with no warmth,or condyle tenderness. Medial joint line tenderness b/l  ROM full in flexion and extension and lower leg rotation. Ligaments with solid consistent endpoints including  LCL, MCL. Negative Mcmurray's tests. Some pain with patellar compression. Patellar glide without crepitus. Patellar and quadriceps tendons unremarkable. Hamstring and quadriceps strength is normal.  Neurovascularly intact    Limited ultrasound: left and right knee:  Left knee:  Moderate effusion   Right knee:  Moderate effusion with medial joint space narrowing  Summary: b/l knee effusions  Ultrasound and interpretation by Clare Gandy, MD   Aspiration/Injection Procedure Note [name] 1960/10/17  Procedure: Injection Indications: right knee pain   Procedure Details Consent: Risks of procedure as well as the alternatives and risks of each were explained to the (patient/caregiver).  Consent for procedure obtained. Time Out: Verified patient identification, verified procedure, site/side was marked, verified correct patient position, special equipment/implants available, medications/allergies/relevent history reviewed, required imaging and test results available.  Performed.  The area was cleaned with iodine and alcohol swabs.    The right knee superior lateral suprapatellar pouch was injected using 1 cc's of 40 mg Depomedrol and 4 cc's of 1% lidocaine with a 25 1 1/2"  needle.  Ultrasound was used. Images were obtained in Long views showing the injection.    A sterile dressing was applied.  Patient did tolerate procedure well.   Aspiration/Injection Procedure Note Erin Bass [birth year]  Procedure: Injection Indications: Left knee pain  Procedure Details Consent: Risks of procedure as well as the alternatives and risks of each were explained to the (patient/caregiver).  Consent for procedure obtained. Time Out: Verified patient identification, verified procedure, site/side was marked, verified correct patient position, special equipment/implants available, medications/allergies/relevent history reviewed, required imaging and test results available.  Performed.  The area was cleaned with iodine and alcohol swabs.    The left knee superior lateral suprapatellar pouch was injected using 1 cc's of 40 mg Depomedrol and 4 cc's of 1% lidocaine with  a 25 1 1/2" needle.  Ultrasound was used. Images were obtained in  Long views showing the injection.    A sterile dressing was applied.  Patient did tolerate procedure well.      ASSESSMENT & PLAN:   Degenerative arthritis of knee, bilateral Each knee has effusion and degenerative changes. Has been unable to tolerate her RA medications and is now maintained on low dose prednisone. Do not appreciate any erosions at this point.  - b/l injections today  - counseled on supportive care - vimovo and pennsaid  - if no improvement consider visco injections.

## 2017-09-28 NOTE — Assessment & Plan Note (Signed)
Each knee has effusion and degenerative changes. Has been unable to tolerate her RA medications and is now maintained on low dose prednisone. Do not appreciate any erosions at this point.  - b/l injections today  - counseled on supportive care - vimovo and pennsaid  - if no improvement consider visco injections.

## 2017-09-28 NOTE — Patient Instructions (Addendum)
Good to see you  Please try the vimovo and pennsaid  Please try to keep your knees wrapped for the compression  Please try ice on the knees  If the injections don't seem to help for very long then we can consider a different injection.

## 2017-10-12 ENCOUNTER — Telehealth: Payer: Self-pay | Admitting: Family Medicine

## 2017-10-12 NOTE — Telephone Encounter (Signed)
Copied from CRM [id number]. Topic: Quick Communication - See Telephone Encounter >> Oct 12, 2017  3:07 PM Windy Kalata, NT wrote: CRM for notification. See Telephone encounter for: 10/12/17.  [facility] pharmacy is calling and is needing the diagnoses and tried and failed medication for Naproxen-Esomeprazole 500-20 MG TBEC  and Diclofenac Sodium (PENNSAID) 2 % SOLN. Please contact.  Priscella Cb# [phone]

## 2017-11-15 ENCOUNTER — Encounter: Payer: Self-pay | Admitting: Family Medicine

## 2017-11-15 ENCOUNTER — Ambulatory Visit: Payer: BLUE CROSS/BLUE SHIELD | Admitting: Family Medicine

## 2017-11-15 VITALS — BP 138/88 | HR 94 | Temp 98.2°F | Ht 61.0 in | Wt 195.0 lb

## 2017-11-15 DIAGNOSIS — M069 Rheumatoid arthritis, unspecified: Secondary | ICD-10-CM

## 2017-11-15 MED ORDER — PREDNISONE 5 MG PO TABS: 7.5000 mg | ORAL_TABLET | Freq: Every day | ORAL | 3 refills | Status: DC

## 2017-11-15 NOTE — Progress Notes (Signed)
Subjective:   [name], female    DOB: 12/02/1960, 57 y.o.   MRN: [id number]  Erin Bass is a pleasant 57 y.o. year old female who presents to clinic today with Follow-up (Has been taking prednisone daily for her RA-has pain intermittently-not as bad as it has been)  on 11/15/2017  HPI:  I last saw her on 09/13/17- note reviewed.  RA- diagnosed in 2013 and has been to numerous rheumatologists during that time. Also has had adverse rxs to several rxs including MTX , leflunomide, and Simponi. Now more recently humira and enbrel.  Note reviewed. CRP elevated. Currently not taking anything.  Enbrel caused side effects too severe- caused elevated LFTs, rashes, hair loss.  Dr. Jordan Likes has seen her for knee injections (09/28/17 most recently).  Tramadol and prednisone seem to give her the most relief.  When I last saw her, she was understandably tearful and frustrated.     Quality of life is now poor because of this.  Copay to see rheumatology is high and every rx she has tried was either too costly or had an adverse reaction.  She asked if I would prescribed prednisone as that seems to be the only rx she can tolerate.  Given complexity of her situation- severe RA with compromised quality of life who has tried multiple rx.   We discussed the  risks of long term steroid use and how bursts can lead to adrenal failure.  She is aware of other risks as well which we discussed today of long term steroid use, such as but not limited to elevated blood sugar, weight gain, osteoporosis.  We agreed to a trial of low dose, daily prednisone,5 mg daily, not a burst but a scheduled daily medication.   She is here to follow this up today.  She feels overall prednisone 5 mg daily has been helpful but over the past several weeks as it is getting colder, she is asking if she can increase her prednisone daily dose.  She also feels cortisone injections were helpful.  "I feel I am living again  and not just surviving."  Current Outpatient Medications on File Prior to Visit  Medication Sig Dispense Refill  . ferrous sulfate 325 (65 FE) MG tablet Take 1 tablet (325 mg total) by mouth 3 (three) times daily after meals. (Patient taking differently: Take 325 mg by mouth daily with breakfast. )  3  . fish oil-omega-3 fatty acids 1000 MG capsule Take 1 g by mouth as needed. Takes for joint pain    . hydrochlorothiazide (HYDRODIURIL) 25 MG tablet Take 1 tablet (25 mg total) by mouth daily. 90 tablet 3  . Multiple Vitamin (MULTIVITAMIN) tablet Take 1 tablet by mouth daily.    . Naproxen-Esomeprazole 500-20 MG TBEC Take 1 tablet by mouth 2 (two) times daily. 60 tablet 3  . predniSONE (DELTASONE) 5 MG tablet Take 1 tablet (5 mg total) by mouth daily with breakfast. 30 tablet 3  . traMADol (ULTRAM) 50 MG tablet Take 1 tablet (50 mg total) by mouth every 6 (six) hours. 60 tablet 2  . Turmeric 500 MG TABS Take 2 tablets by mouth daily.     No current facility-administered medications on file prior to visit.     Allergies  Allergen Reactions  . Leflunomide Other (See Comments)    Rash  Incontinence   . Adalimumab Swelling  . Methotrexate Derivatives Other (See Comments)    Elevated Liver Functions and Bruising  .  Simponi [Golimumab] Rash    Past Medical History:  Diagnosis Date  . Anemia   . Arthritis   . HTN (hypertension)     Past Surgical History:  Procedure Laterality Date  . ABDOMINAL HYSTERECTOMY     Partial- Uterus only  . bone spur - removal    . CESAREAN SECTION     x2  . PARTIAL HYSTERECTOMY  1/06   fibroids; ovaries left in   . TOTAL HIP ARTHROPLASTY Right 12/20/2012   Procedure: RIGHT TOTAL HIP ARTHROPLASTY ANTERIOR APPROACH;  Surgeon: Shelda Pal, MD;  Location: [facility];  Service: Orthopedics;  Laterality: Right;  . TUBAL LIGATION      Family History  Problem Relation Age of Onset  . Depression Unknown        both sides of family   . Anemia Mother      Social History   Socioeconomic History  . Marital status: Married    Spouse name: Not on file  . Number of children: Not on file  . Years of education: Not on file  . Highest education level: Not on file  Occupational History  . Not on file  Social Needs  . Financial resource strain: Not on file  . Food insecurity:    Worry: Not on file    Inability: Not on file  . Transportation needs:    Medical: Not on file    Non-medical: Not on file  Tobacco Use  . Smoking status: Never Smoker  . Smokeless tobacco: Never Used  Substance and Sexual Activity  . Alcohol use: Yes    Comment: occassionally  . Drug use: No  . Sexual activity: Not on file  Lifestyle  . Physical activity:    Days per week: Not on file    Minutes per session: Not on file  . Stress: Not on file  Relationships  . Social connections:    Talks on phone: Not on file    Gets together: Not on file    Attends religious service: Not on file    Active member of club or organization: Not on file    Attends meetings of clubs or organizations: Not on file    Relationship status: Not on file  . Intimate partner violence:    Fear of current or ex partner: Not on file    Emotionally abused: Not on file    Physically abused: Not on file    Forced sexual activity: Not on file  Other Topics Concern  . Not on file  Social History Narrative   Married; 2 children, 1 at home.    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   The PMH, PSH, Social History, Family History, Medications, and allergies have been reviewed in [facility], and have been updated if relevant.   Review of Systems  Constitutional: Negative.   Endocrine: Negative.   Musculoskeletal: Positive for arthralgias and joint swelling. Negative for myalgias, neck pain and neck stiffness.  All other systems reviewed and are negative.      Objective:    BP 138/88   Pulse 94   Temp 98.2 F (36.8 C) (Oral)   Ht 5\' 1"  (1.549 m)   Wt 195 lb (88.5 kg)    SpO2 98%   BMI 36.84 kg/m    Physical Exam  Constitutional: She is oriented to person, place, and time. She appears well-developed and well-nourished. No distress.  HENT:  Head: Normocephalic and atraumatic.  Eyes: EOM are  normal.  Neck: Normal range of motion.  Cardiovascular: Regular rhythm.  Pulmonary/Chest: Effort normal.  Musculoskeletal:       Right hand: She exhibits deformity.       Left hand: She exhibits deformity.  Neurological: She is alert and oriented to person, place, and time. No cranial nerve deficit.  Walking without her cane today  Skin: Skin is warm and dry. She is not diaphoretic.  Psychiatric: She has a normal mood and affect. Her behavior is normal. Judgment and thought content normal.  Nursing note and vitals reviewed.         Assessment & Plan:   No diagnosis found. No follow-ups on file.

## 2017-11-15 NOTE — Patient Instructions (Signed)
Great to see you- we are increasing your prednisone to 7.5 mg daily.  Please schedule an appointment with Dr. Jordan Likes on your way out.  Please come see me in 3 months.

## 2017-11-15 NOTE — Assessment & Plan Note (Signed)
>  15 minutes spent in face to face time with patient, >50% spent in counselling or coordination of care discussing RA and OA. We agreed to increase daily prednisone to 7.5 mg daily with the plan of decreasing it back down to 5 mg daily after the winter months.  She is considering repeat knee injections with Dr. Jordan Likes as well.  Follow up with me in 3 months- labs at that time as well. The patient indicates understanding of these issues and agrees with the plan.

## 2017-11-15 NOTE — Progress Notes (Addendum)
Patient was seen today by Dr. Aron-mentioned she would like to seek further treatment for knee pain wants to pursue bilateral visco gel injections. Knee pain has not improved-received bilateral knee injections on 09/28/17. Please advise and I will get pre-auth for her insurance.

## 2017-11-17 ENCOUNTER — Telehealth: Payer: Self-pay | Admitting: Family Medicine

## 2017-11-17 NOTE — Telephone Encounter (Signed)
Informed that gel injections were approved and see can follow up to schedule orthovisc injections.   Myra Rude, MD [facility] 11/17/2017, 9:46 AM

## 2017-11-22 ENCOUNTER — Ambulatory Visit: Payer: BLUE CROSS/BLUE SHIELD | Admitting: Family Medicine

## 2017-11-22 ENCOUNTER — Ambulatory Visit (INDEPENDENT_AMBULATORY_CARE_PROVIDER_SITE_OTHER): Payer: BLUE CROSS/BLUE SHIELD

## 2017-11-22 ENCOUNTER — Encounter: Payer: Self-pay | Admitting: Family Medicine

## 2017-11-22 VITALS — BP 128/82 | HR 84 | Temp 97.8°F | Ht 61.0 in | Wt 194.0 lb

## 2017-11-22 DIAGNOSIS — M170 Bilateral primary osteoarthritis of knee: Secondary | ICD-10-CM

## 2017-11-22 NOTE — Progress Notes (Signed)
AMREEN RACZKOWSKI - 57 y.o. female MRN [id number]  Date of birth: 07/18/1960  SUBJECTIVE:  Including CC & ROS.  Chief Complaint  Patient presents with  . Knee Pain    pt c/o chronic bilateral knee pain , pt is experiencing swelling . pt is here for knee injections    KIMIA FINAN is a 57 y.o. female that is presenting with bilateral knee pain.  The pain is localized to the knees.  She does admit swelling.  She denies any mechanism of injury.  She denies any inciting event.  She reports the pain is acute on chronic in nature.  Has tried Pennsaid and other medications with limited improvement.  Has received steroid injections in the past with limited improvement.  Has a history of rheumatoid arthritis.  The pain is moderate to severe.  Pain is localized to the knee.  Denies any locking or giving way.  Independent review of her left and right knee x-ray from 2/26/ 18 shows mild medial joint space narrowing bilaterally as well as moderate effusions.   Review of Systems  Constitutional: Negative for fever.  HENT: Negative for congestion.   Respiratory: Negative for cough.   Cardiovascular: Negative for chest pain.  Gastrointestinal: Negative for abdominal pain.  Musculoskeletal: Positive for arthralgias and joint swelling.  Skin: Negative for color change.  Neurological: Negative for weakness.  Hematological: Negative for adenopathy.  Psychiatric/Behavioral: Negative for agitation.    HISTORY: Past Medical, Surgical, Social, and Family History Reviewed & Updated per EMR.   Pertinent Historical Findings include:  Past Medical History:  Diagnosis Date  . Anemia   . Arthritis   . HTN (hypertension)     Past Surgical History:  Procedure Laterality Date  . ABDOMINAL HYSTERECTOMY     Partial- Uterus only  . bone spur - removal    . CESAREAN SECTION     x2  . PARTIAL HYSTERECTOMY  1/06   fibroids; ovaries left in   . TOTAL HIP ARTHROPLASTY Right 12/20/2012   Procedure: RIGHT TOTAL HIP  ARTHROPLASTY ANTERIOR APPROACH;  Surgeon: Shelda Pal, MD;  Location: [facility];  Service: Orthopedics;  Laterality: Right;  . TUBAL LIGATION      Allergies  Allergen Reactions  . Leflunomide Other (See Comments)    Rash  Incontinence   . Adalimumab Swelling  . Methotrexate Derivatives Other (See Comments)    Elevated Liver Functions and Bruising  . Simponi [Golimumab] Rash    Family History  Problem Relation Age of Onset  . Depression Unknown        both sides of family   . Anemia Mother      Social History   Socioeconomic History  . Marital status: Married    Spouse name: Not on file  . Number of children: Not on file  . Years of education: Not on file  . Highest education level: Not on file  Occupational History  . Not on file  Social Needs  . Financial resource strain: Not on file  . Food insecurity:    Worry: Not on file    Inability: Not on file  . Transportation needs:    Medical: Not on file    Non-medical: Not on file  Tobacco Use  . Smoking status: Never Smoker  . Smokeless tobacco: Never Used  Substance and Sexual Activity  . Alcohol use: Yes    Comment: occassionally  . Drug use: No  . Sexual activity: Not on file  Lifestyle  .  Physical activity:    Days per week: Not on file    Minutes per session: Not on file  . Stress: Not on file  Relationships  . Social connections:    Talks on phone: Not on file    Gets together: Not on file    Attends religious service: Not on file    Active member of club or organization: Not on file    Attends meetings of clubs or organizations: Not on file    Relationship status: Not on file  . Intimate partner violence:    Fear of current or ex partner: Not on file    Emotionally abused: Not on file    Physically abused: Not on file    Forced sexual activity: Not on file  Other Topics Concern  . Not on file  Social History Narrative   Married; 2 children, 1 at home.    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     PHYSICAL EXAM:  VS: BP 128/82 (BP Location: Right Arm, Patient Position: Sitting, Cuff Size: Normal)   Pulse 84   Temp 97.8 F (36.6 C) (Oral)   Ht 5\' 1"  (1.549 m)   Wt 194 lb (88 kg)   SpO2 95%   BMI 36.66 kg/m  Physical Exam Gen: NAD, alert, cooperative with exam, well-appearing ENT: normal lips, normal nasal mucosa,  Eye: normal EOM, normal conjunctiva and lids CV:  no edema, +2 pedal pulses   Resp: no accessory muscle use, non-labored,  Skin: no rashes, no areas of induration  Neuro: normal tone, normal sensation to touch Psych:  normal insight, alert and oriented MSK:  Left and right knee: Obvious effusion. Tenderness to palpation of the medial joint line. Normal range of motion. Normal strength resistance. Negative McMurray's test. Instability with valgus testing. No pain with patellar grind. Neurovascularly intact   Aspiration/Injection Procedure Note LEILANNY FLUITT Jun 22, 1960  Procedure: Injection Indications: right knee pain   Procedure Details Consent: Risks of procedure as well as the alternatives and risks of each were explained to the (patient/caregiver).  Consent for procedure obtained. Time Out: Verified patient identification, verified procedure, site/side was marked, verified correct patient position, special equipment/implants available, medications/allergies/relevent history reviewed, required imaging and test results available.  Performed.  The area was cleaned with iodine and alcohol swabs.    The right knee superior lateral suprapatellar pouch was injected using 4 cc's of 1% lidocaine with a 22 1 1/2" needle.  The syringe was switched and a 2 mL 15 mg/mL of Orthovisc was injected. Ultrasound was used. Images were obtained in  Long views showing the injection.    A sterile dressing was applied.  Patient did tolerate procedure well.    Aspiration/Injection Procedure Note MARYAM FEELY [birth year]  Procedure:  Injection Indications: left knee pain   Procedure Details Consent: Risks of procedure as well as the alternatives and risks of each were explained to the (patient/caregiver).  Consent for procedure obtained. Time Out: Verified patient identification, verified procedure, site/side was marked, verified correct patient position, special equipment/implants available, medications/allergies/relevent history reviewed, required imaging and test results available.  Performed.  The area was cleaned with iodine and alcohol swabs.    The left knee superior lateral suprapatellar pouch was injected using 4 cc's of 1% lidocaine with a 22 1 1/2" needle.  The syringe was switched and a 2 mL 15 mg/mL of Orthovisc was injected. Ultrasound was used. Images were obtained in  Long views  showing the injection.    A sterile dressing was applied.  Patient did tolerate procedure well.    ASSESSMENT & PLAN:   Degenerative arthritis of knee, bilateral Started gel injections today of Orthovisc. -Follow-up in 1 week. -Counseled on supportive care

## 2017-11-22 NOTE — Patient Instructions (Signed)
Good to see you  Please see me back in one week  Please try to ice your knees

## 2017-11-22 NOTE — Assessment & Plan Note (Signed)
Started gel injections today of Orthovisc. -Follow-up in 1 week. -Counseled on supportive care

## 2017-12-02 ENCOUNTER — Encounter: Payer: Self-pay | Admitting: Family Medicine

## 2017-12-02 ENCOUNTER — Ambulatory Visit: Payer: BLUE CROSS/BLUE SHIELD | Admitting: Family Medicine

## 2017-12-02 ENCOUNTER — Ambulatory Visit (INDEPENDENT_AMBULATORY_CARE_PROVIDER_SITE_OTHER): Payer: BLUE CROSS/BLUE SHIELD

## 2017-12-02 VITALS — BP 160/94 | HR 97 | Temp 97.4°F | Ht 61.0 in | Wt 195.0 lb

## 2017-12-02 DIAGNOSIS — M170 Bilateral primary osteoarthritis of knee: Secondary | ICD-10-CM

## 2017-12-02 NOTE — Progress Notes (Signed)
Erin Bass - 57 y.o. female MRN [id number]  Date of birth: 1960/03/13  SUBJECTIVE:  Including CC & ROS.  Chief Complaint  Patient presents with  . Knee Pain    f/u on knee pain , knee injections last week . pt still having pain 7/10    Erin Bass is a 57 y.o. female that is presenting for her second bilateral injections of Orthovisc.Marland Kitchen    Review of Systems  HISTORY: Past Medical, Surgical, Social, and Family History Reviewed & Updated per EMR.   Pertinent Historical Findings include:  Past Medical History:  Diagnosis Date  . Anemia   . Arthritis   . HTN (hypertension)     Past Surgical History:  Procedure Laterality Date  . ABDOMINAL HYSTERECTOMY     Partial- Uterus only  . bone spur - removal    . CESAREAN SECTION     x2  . PARTIAL HYSTERECTOMY  1/06   fibroids; ovaries left in   . TOTAL HIP ARTHROPLASTY Right 12/20/2012   Procedure: RIGHT TOTAL HIP ARTHROPLASTY ANTERIOR APPROACH;  Surgeon: Shelda Pal, MD;  Location: [facility];  Service: Orthopedics;  Laterality: Right;  . TUBAL LIGATION      Allergies  Allergen Reactions  . Leflunomide Other (See Comments)    Rash  Incontinence   . Adalimumab Swelling  . Methotrexate Derivatives Other (See Comments)    Elevated Liver Functions and Bruising  . Simponi [Golimumab] Rash    Family History  Problem Relation Age of Onset  . Depression Unknown        both sides of family   . Anemia Mother      Social History   Socioeconomic History  . Marital status: Married    Spouse name: Not on file  . Number of children: Not on file  . Years of education: Not on file  . Highest education level: Not on file  Occupational History  . Not on file  Social Needs  . Financial resource strain: Not on file  . Food insecurity:    Worry: Not on file    Inability: Not on file  . Transportation needs:    Medical: Not on file    Non-medical: Not on file  Tobacco Use  . Smoking status: Never Smoker  . Smokeless  tobacco: Never Used  Substance and Sexual Activity  . Alcohol use: Yes    Comment: occassionally  . Drug use: No  . Sexual activity: Not on file  Lifestyle  . Physical activity:    Days per week: Not on file    Minutes per session: Not on file  . Stress: Not on file  Relationships  . Social connections:    Talks on phone: Not on file    Gets together: Not on file    Attends religious service: Not on file    Active member of club or organization: Not on file    Attends meetings of clubs or organizations: Not on file    Relationship status: Not on file  . Intimate partner violence:    Fear of current or ex partner: Not on file    Emotionally abused: Not on file    Physically abused: Not on file    Forced sexual activity: Not on file  Other Topics Concern  . Not on file  Social History Narrative   Married; 2 children, 1 at home.    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  Consent for procedure obtained. Time Out: Verified patient identification, verified procedure, site/side was marked, verified correct patient position, special equipment/implants available, medications/allergies/relevent history reviewed, required imaging and test results available.  Performed.  The area was cleaned with iodine and alcohol swabs.    The right knee superior lateral suprapatellar pouch was injected using 4 cc's of 1%  lidocaine with a 22 1 1/2" needle. An   18-gauge needle was used for aspiration and the syringe was switched and a 2 mL 15 mg/mL of Orthovisc was injected. Ultrasound was used. Images were obtained in  Long views showing the injection.    Amount of Fluid Aspirated: 43mL Character of Fluid: cloudy and gelatinous Fluid was sent for:n/a A sterile dressing was applied.  Patient did tolerate procedure well.   Aspiration/Injection Procedure Note Erin Bass Nov 14, 1960  Procedure: Aspiration and Injection Indications: Left knee pain  Procedure Details Consent: Risks of procedure as well as the alternatives and risks of each were explained to the (patient/caregiver).  Consent for procedure obtained. Time Out: Verified patient identification, verified procedure, site/side was marked, verified correct patient position, special equipment/implants available, medications/allergies/relevent history reviewed, required imaging and test results available.  Performed.  The area was cleaned with iodine and alcohol swabs.    The left knee superior lateral suprapatellar pouch was injected using 4 cc's of 1% lidocaine with a 22 1 1/2" needle.  An 18-gauge inch and half needle was used for aspiration and the syringe was switched and a 2 mL 15 mg/mL of Orthovisc was injected. Ultrasound was used. Images were obtained in Long views showing the injection.    Amount of Fluid Aspirated: 34mL Character of Fluid: cloudy and gelatinous Fluid was sent for:n/a A sterile dressing was applied.  Patient did tolerate procedure well.     ASSESSMENT & PLAN:   Degenerative arthritis of knee, bilateral Completed her second injections of Orthovisc. -Follow-up in 1 week.

## 2017-12-02 NOTE — Assessment & Plan Note (Signed)
Completed her second injections of Orthovisc. -Follow-up in 1 week.

## 2017-12-02 NOTE — Patient Instructions (Signed)
Good to see you  Please follow up with me in one week.  

## 2017-12-09 ENCOUNTER — Ambulatory Visit: Payer: BLUE CROSS/BLUE SHIELD | Admitting: Family Medicine

## 2017-12-09 ENCOUNTER — Encounter: Payer: Self-pay | Admitting: Family Medicine

## 2017-12-09 ENCOUNTER — Ambulatory Visit (INDEPENDENT_AMBULATORY_CARE_PROVIDER_SITE_OTHER): Payer: BLUE CROSS/BLUE SHIELD

## 2017-12-09 ENCOUNTER — Other Ambulatory Visit: Payer: Self-pay | Admitting: Family Medicine

## 2017-12-09 DIAGNOSIS — M170 Bilateral primary osteoarthritis of knee: Secondary | ICD-10-CM

## 2017-12-09 MED ORDER — TRAMADOL HCL 50 MG PO TABS: 50.0000 mg | ORAL_TABLET | Freq: Four times a day (QID) | ORAL | 2 refills | Status: DC

## 2017-12-09 NOTE — Patient Instructions (Signed)
Good to see you  Please see me back in 4 weeks if you would like to have me check your knees  Have a good Thanksgiving.

## 2017-12-09 NOTE — Assessment & Plan Note (Signed)
Completed her series of Orthovisc injections. -Can follow-up in 4 weeks

## 2017-12-09 NOTE — Progress Notes (Signed)
Erin Bass - 57 y.o. female MRN [id number]  Date of birth: 07-22-1960  SUBJECTIVE:  Including CC & ROS.  No chief complaint on file.   Erin Bass is a 57 y.o. female that is presenting to complete her series of Orthovisc injections.    Review of Systems  HISTORY: Past Medical, Surgical, Social, and Family History Reviewed & Updated per EMR.   Pertinent Historical Findings include:  Past Medical History:  Diagnosis Date  . Anemia   . Arthritis   . HTN (hypertension)     Past Surgical History:  Procedure Laterality Date  . ABDOMINAL HYSTERECTOMY     Partial- Uterus only  . bone spur - removal    . CESAREAN SECTION     x2  . PARTIAL HYSTERECTOMY  1/06   fibroids; ovaries left in   . TOTAL HIP ARTHROPLASTY Right 12/20/2012   Procedure: RIGHT TOTAL HIP ARTHROPLASTY ANTERIOR APPROACH;  Surgeon: Shelda Pal, MD;  Location: [facility];  Service: Orthopedics;  Laterality: Right;  . TUBAL LIGATION      Allergies  Allergen Reactions  . Leflunomide Other (See Comments)    Rash  Incontinence   . Adalimumab Swelling  . Methotrexate Derivatives Other (See Comments)    Elevated Liver Functions and Bruising  . Simponi [Golimumab] Rash    Family History  Problem Relation Age of Onset  . Depression Unknown        both sides of family   . Anemia Mother      Social History   Socioeconomic History  . Marital status: Married    Spouse name: Not on file  . Number of children: Not on file  . Years of education: Not on file  . Highest education level: Not on file  Occupational History  . Not on file  Social Needs  . Financial resource strain: Not on file  . Food insecurity:    Worry: Not on file    Inability: Not on file  . Transportation needs:    Medical: Not on file    Non-medical: Not on file  Tobacco Use  . Smoking status: Never Smoker  . Smokeless tobacco: Never Used  Substance and Sexual Activity  . Alcohol use: Yes    Comment: occassionally  . Drug  use: No  . Sexual activity: Not on file  Lifestyle  . Physical activity:    Days per week: Not on file    Minutes per session: Not on file  . Stress: Not on file  Relationships  . Social connections:    Talks on phone: Not on file    Gets together: Not on file    Attends religious service: Not on file    Active member of club or organization: Not on file    Attends meetings of clubs or organizations: Not on file    Relationship status: Not on file  . Intimate partner violence:    Fear of current or ex partner: Not on file    Emotionally abused: Not on file    Physically abused: Not on file    Forced sexual activity: Not on file  Other Topics Concern  . Not on file  Social History Narrative   Married; 2 children, 1 at home.    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 Physical Exam Gen: NAD, alert, cooperative with exam, well-appearing  Aspiration/Injection Procedure Note Erin Bass 03-Sep-1960  Procedure: Injection Indications: right knee pain   Procedure Details Consent: Risks of procedure as well as the alternatives and risks of each were explained to the (patient/caregiver).  Consent for procedure obtained. Time Out: Verified patient identification, verified procedure, site/side was marked, verified correct patient position, special equipment/implants available, medications/allergies/relevent history reviewed, required imaging and test results available.  Performed.  The area was cleaned with iodine and alcohol swabs.    The right knee superior lateral suprapatellar pouch was injected using 4 cc's of 1% lidocaine with a 22 1 1/2" needle.  The syringe was switched and a 2 mL 15 mg/mL of Orthovisc was injected. Ultrasound was used. Images were obtained in  Long views showing the injection.    A sterile dressing was applied.  Patient did tolerate procedure well.   Aspiration/Injection  Procedure Note Erin Bass 1960/10/10  Procedure: Aspiration and Injection Indications: left knee pain   Procedure Details Consent: Risks of procedure as well as the alternatives and risks of each were explained to the (patient/caregiver).  Consent for procedure obtained. Time Out: Verified patient identification, verified procedure, site/side was marked, verified correct patient position, special equipment/implants available, medications/allergies/relevent history reviewed, required imaging and test results available.  Performed.  The area was cleaned with iodine and alcohol swabs.    The left knee superior lateral suprapatellar pouch was injected using 4 cc's of 1% lidocaine with a 22 1 1/2" needle.  An 18-gauge inch and half needle was inserted to achieve aspiration and the syringe was switched and a 2 mL 15 mg/mL of Orthovisc was injected. Ultrasound was used. Images were obtained in  Long views showing the injection.    Amount of Fluid Aspirated: 58mL Character of Fluid: cloudy and gelatinous Fluid was sent for:n/a A sterile dressing was applied.  Patient did tolerate procedure well.       ASSESSMENT & PLAN:   Degenerative arthritis of knee, bilateral Completed her series of Orthovisc injections. -Can follow-up in 4 weeks

## 2018-01-05 ENCOUNTER — Encounter: Payer: Self-pay | Admitting: Family Medicine

## 2018-01-17 ENCOUNTER — Encounter: Payer: Self-pay | Admitting: Family Medicine

## 2018-01-31 ENCOUNTER — Encounter: Payer: Self-pay | Admitting: Family Medicine

## 2018-02-02 ENCOUNTER — Encounter: Payer: Self-pay | Admitting: Family Medicine

## 2018-02-02 ENCOUNTER — Ambulatory Visit: Payer: BLUE CROSS/BLUE SHIELD | Admitting: Family Medicine

## 2018-02-02 ENCOUNTER — Ambulatory Visit (INDEPENDENT_AMBULATORY_CARE_PROVIDER_SITE_OTHER): Payer: BLUE CROSS/BLUE SHIELD

## 2018-02-02 VITALS — BP 130/90 | HR 82 | Ht 61.0 in | Wt 195.0 lb

## 2018-02-02 DIAGNOSIS — M170 Bilateral primary osteoarthritis of knee: Secondary | ICD-10-CM | POA: Diagnosis not present

## 2018-02-02 NOTE — Progress Notes (Signed)
Erin Bass - 58 y.o. female MRN [id number]  Date of birth: 04/21/1960  SUBJECTIVE:  Including CC & ROS.  Chief Complaint  Patient presents with  . Knee Pain    bilateral knee pain , pt stst the orthovisc injection did not help and would like to try something different. pt sts 5mg  prednisone helps but side effects of hair loss     Erin Bass is a 58 y.o. female that is presenting with left and right knee pain. This is acute on chronic in nature. Has history of rheumatoid. intolerant to medications. Has been taking ibuprofen with some improvement. No inciting event. Pain is constant. Has swelling of each knee. No improvement with the gel injections. Has to use a cane to walk. No numbness or tingling.  Independent review of the left and right knee xray from 03/16/16 shows an effusion bilaterally and medial joint space narrowing.    Review of Systems  Constitutional: Negative for fever.  HENT: Negative for congestion.   Respiratory: Negative for cough.   Cardiovascular: Negative for chest pain.  Gastrointestinal: Negative for abdominal pain.  Musculoskeletal: Positive for arthralgias, gait problem and joint swelling.  Skin: Negative for color change.  Neurological: Negative for weakness.  Hematological: Negative for adenopathy.  Psychiatric/Behavioral: Negative for agitation.    HISTORY: Past Medical, Surgical, Social, and Family History Reviewed & Updated per EMR.   Pertinent Historical Findings include:  Past Medical History:  Diagnosis Date  . Anemia   . Arthritis   . HTN (hypertension)     Past Surgical History:  Procedure Laterality Date  . ABDOMINAL HYSTERECTOMY     Partial- Uterus only  . bone spur - removal    . CESAREAN SECTION     x2  . PARTIAL HYSTERECTOMY  1/06   fibroids; ovaries left in   . TOTAL HIP ARTHROPLASTY Right 12/20/2012   Procedure: RIGHT TOTAL HIP ARTHROPLASTY ANTERIOR APPROACH;  Surgeon: Shelda Pal, MD;  Location: [facility];  Service:  Orthopedics;  Laterality: Right;  . TUBAL LIGATION      Allergies  Allergen Reactions  . Leflunomide Other (See Comments)    Rash  Incontinence   . Adalimumab Swelling  . Methotrexate Derivatives Other (See Comments)    Elevated Liver Functions and Bruising  . Simponi [Golimumab] Rash    Family History  Problem Relation Age of Onset  . Depression Unknown        both sides of family   . Anemia Mother      Social History   Socioeconomic History  . Marital status: Married    Spouse name: Not on file  . Number of children: Not on file  . Years of education: Not on file  . Highest education level: Not on file  Occupational History  . Not on file  Social Needs  . Financial resource strain: Not on file  . Food insecurity:    Worry: Not on file    Inability: Not on file  . Transportation needs:    Medical: Not on file    Non-medical: Not on file  Tobacco Use  . Smoking status: Never Smoker  . Smokeless tobacco: Never Used  Substance and Sexual Activity  . Alcohol use: Yes    Comment: occassionally  . Drug use: No  . Sexual activity: Not on file  Lifestyle  . Physical activity:    Days per week: Not on file    Minutes per session: Not on file  .  Stress: Not on file  Relationships  . Social connections:    Talks on phone: Not on file    Gets together: Not on file    Attends religious service: Not on file    Active member of club or organization: Not on file    Attends meetings of clubs or organizations: Not on file    Relationship status: Not on file  . Intimate partner violence:    Fear of current or ex partner: Not on file    Emotionally abused: Not on file    Physically abused: Not on file    Forced sexual activity: Not on file  Other Topics Concern  . Not on file  Social History Narrative   Married; 2 children, 1 at home.    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     PHYSICAL EXAM:  VS: BP 130/90   Pulse 82   Ht 5\' 1"  (1.549 m)   Wt  195 lb (88.5 kg)   SpO2 96%   BMI 36.84 kg/m  Physical Exam Gen: NAD, alert, cooperative with exam, well-appearing ENT: normal lips, normal nasal mucosa,  Eye: normal EOM, normal conjunctiva and lids CV:  no edema, +2 pedal pulses   Resp: no accessory muscle use, non-labored,  Skin: no rashes, no areas of induration  Neuro: normal tone, normal sensation to touch Psych:  normal insight, alert and oriented MSK:  Left and Right knee:  Effusion b/l  Normal ROM  Normal strength to resistance  TTP of the medial and lateral joint line  Negative McMurray's test  neurovsacularly intact    Aspiration/Injection Procedure Note Erin StarcherDarlene B Bass 06/01/1960  Procedure: Aspiration and Injection Indications: right knee pain   Procedure Details Consent: Risks of procedure as well as the alternatives and risks of each were explained to the (patient/caregiver).  Consent for procedure obtained. Time Out: Verified patient identification, verified procedure, site/side was marked, verified correct patient position, special equipment/implants available, medications/allergies/relevent history reviewed, required imaging and test results available.  Performed.  The area was cleaned with iodine and alcohol swabs.    The right knee superior lateral suprapatellar pouch was injected using 4 cc of 1% lidocaine without epinephrine to anesthetize the skin in the tract.  An 18-gauge inch and a half needle was inserted in order to attempt aspiration.  Unable to aspirate.  The syringe was then exchanged and a mixture containing 1 cc's of 40 mg Kenalog and 4 cc's of 0.25% bupivacaine was injected.  Ultrasound was used. Images were obtained in Long views showing the injection.     A sterile dressing was applied.  Patient did tolerate procedure well.   Aspiration/Injection Procedure Note Erin StarcherDarlene B Bass 05/16/1960  Procedure: Aspiration and Injection Indications: Left knee pain  Procedure Details Consent: Risks of  procedure as well as the alternatives and risks of each were explained to the (patient/caregiver).  Consent for procedure obtained. Time Out: Verified patient identification, verified procedure, site/side was marked, verified correct patient position, special equipment/implants available, medications/allergies/relevent history reviewed, required imaging and test results available.  Performed.  The area was cleaned with iodine and alcohol swabs.    The left knee superior lateral suprapatellar pouch was injected using 4 cc of 1% lidocaine without epinephrine to anesthetize the skin in the tract.  An 18-gauge inch and a half needle was inserted in order to attempt aspiration.  Unable to aspirate.  The syringe was then switched and a mixture containing 1 cc  of 40 mg Kenalog and 4 cc's of 0.25% bupivacaine was injected.  Ultrasound was used. Images were obtained in long views showing the injection.     A sterile dressing was applied.  Patient did tolerate procedure well.        ASSESSMENT & PLAN:   Degenerative arthritis of knee, bilateral Acute on chronic in nature. related to er underlying rheumatoid.  - b/l injection today. Unable to aspirate.  - counseled on supportive care - would get up dated imaging at some point. Could consider joint replacement if pain isn't controlled with injections.

## 2018-02-02 NOTE — Patient Instructions (Signed)
Good to see you  Please use ice on the knees  You can take ibuprofen but try not to take it everyday Please let me know if your wan

## 2018-02-02 NOTE — Assessment & Plan Note (Signed)
Acute on chronic in nature. related to er underlying rheumatoid.  - b/l injection today. Unable to aspirate.  - counseled on supportive care - would get up dated imaging at some point. Could consider joint replacement if pain isn't controlled with injections.

## 2018-02-14 ENCOUNTER — Ambulatory Visit: Payer: BLUE CROSS/BLUE SHIELD | Admitting: Family Medicine

## 2018-02-21 ENCOUNTER — Encounter: Payer: Self-pay | Admitting: Family Medicine

## 2018-02-21 ENCOUNTER — Ambulatory Visit: Payer: BLUE CROSS/BLUE SHIELD | Admitting: Family Medicine

## 2018-02-21 VITALS — BP 116/60 | HR 91 | Temp 97.8°F | Ht 61.0 in | Wt 193.6 lb

## 2018-02-21 DIAGNOSIS — M069 Rheumatoid arthritis, unspecified: Secondary | ICD-10-CM

## 2018-02-21 DIAGNOSIS — E559 Vitamin D deficiency, unspecified: Secondary | ICD-10-CM | POA: Diagnosis not present

## 2018-02-21 DIAGNOSIS — Z1239 Encounter for other screening for malignant neoplasm of breast: Secondary | ICD-10-CM

## 2018-02-21 DIAGNOSIS — E785 Hyperlipidemia, unspecified: Secondary | ICD-10-CM

## 2018-02-21 DIAGNOSIS — I10 Essential (primary) hypertension: Secondary | ICD-10-CM | POA: Diagnosis not present

## 2018-02-21 DIAGNOSIS — D649 Anemia, unspecified: Secondary | ICD-10-CM | POA: Diagnosis not present

## 2018-02-21 LAB — COMPREHENSIVE METABOLIC PANEL
ALT: 13 U/L (ref 0–35)
AST: 13 U/L (ref 0–37)
Albumin: 4 g/dL (ref 3.5–5.2)
Alkaline Phosphatase: 60 U/L (ref 39–117)
BUN: 26 mg/dL — ABNORMAL HIGH (ref 6–23)
CO2: 30 mEq/L (ref 19–32)
Calcium: 9.6 mg/dL (ref 8.4–10.5)
Chloride: 100 mEq/L (ref 96–112)
Creatinine, Ser: 1.01 mg/dL (ref 0.40–1.20)
GFR: 68.1 mL/min (ref >60.00)
Glucose, Bld: 108 mg/dL — ABNORMAL HIGH (ref 70–99)
POTASSIUM: 3.4 meq/L — AB (ref 3.5–5.1)
Sodium: 137 mEq/L (ref 135–145)
Total Bilirubin: 0.3 mg/dL (ref 0.2–1.2)
Total Protein: 7.6 g/dL (ref 6.0–8.3)

## 2018-02-21 LAB — TSH: TSH: 0.76 u[IU]/mL (ref 0.35–4.50)

## 2018-02-21 LAB — SEDIMENTATION RATE: Sed Rate: 114 mm/hr — ABNORMAL HIGH (ref 0–30)

## 2018-02-21 LAB — VITAMIN D 25 HYDROXY (VIT D DEFICIENCY, FRACTURES): VITD: 40.14 ng/mL (ref 30.00–100.00)

## 2018-02-21 MED ORDER — HYDROCHLOROTHIAZIDE 25 MG PO TABS: 25.0000 mg | ORAL_TABLET | Freq: Every day | ORAL | 3 refills | Status: DC

## 2018-02-21 MED ORDER — PREDNISONE 5 MG PO TABS: 7.5000 mg | ORAL_TABLET | Freq: Every day | ORAL | 11 refills | Status: DC

## 2018-02-21 MED ORDER — TRAMADOL HCL 50 MG PO TABS: 50.0000 mg | ORAL_TABLET | Freq: Four times a day (QID) | ORAL | 5 refills | Status: DC

## 2018-02-21 NOTE — Assessment & Plan Note (Signed)
>  40  minutes spent in face to face time with patient, >50% spent in counselling or coordination of care on RA, OA, anemia, Vit D deficiency, HTN.  We agreed to continue prednisone 5 mg daily.  Also refilled her tramadol for severe pain.  Continue to see Dr. Jordan Likes- knee injections have helped tremendously. Tramadol and prednisone have been refilled.

## 2018-02-21 NOTE — Assessment & Plan Note (Signed)
Well controlled.  No changes made. eRx refills sent. Lab Results  Component Value Date   CREATININE 0.84 09/01/2017

## 2018-02-21 NOTE — Patient Instructions (Signed)
Great to see you. I will call you with your lab results from today and you can view them online.   Please call the breast center at [phone] to schedule your mammogram.  

## 2018-02-21 NOTE — Progress Notes (Signed)
Subjective:   [name], female    DOB: 09/26/1960, 58 y.o.   MRN: [id number]  Erin Bass is a pleasant 58 y.o. year old female who presents to clinic today with Follow-up (Patient is here today for a F/U.  She was Dx with RA in 2013 and after trials of many different meds with negative outcomes she is taking Prednisone and Tramadol.  She as been seeing Dr. Jordan LikesSchmitz for bilateral knee pain.  Prednisone was increased to 7.5mg  and then would decrease to 5mg  after the winter months.  She agrees [facility]amm[facility] if order is placed in systmen. She states that she just takes the Prednisone 5mg  since the pharmacy won't cut it for her.)  on 02/21/2018  HPI:   RA- diagnosed in 2013 and has been to numerous rheumatologists during that time. Also has had adverse rxs to several rxs including MTX , leflunomide, and Simponi. Now more recently humira and enbrel.    Tramadol and prednisone seem to give her the most relief.  Copay to see rheumatology is high and every rx she has tried was either too costly or had an adverse reaction. She asked if I would prescribed prednisone as that seems to be the only rx she can tolerate.  Given complexity of her situation- severe RA with compromised quality of life who has tried multiple rx.   We discussed the  risks of long term steroid use and how bursts can lead to adrenal failure. She is aware of other risks as well which we discussed today of long term steroid use, such as but not limited to elevated blood sugar, weight gain, osteoporosis. We agreed to a trial of low dose, daily prednisone,5 mg daily, not a burst but a scheduled daily medication.   She is here to follow this up today.  She is seeing Dr. Jordan LikesSchmitz for knee injections which has also helped tremendously.  Last injection was on 02/02/18.  Not even using her cane.  H/o anemia-  Lab Results  Component Value Date   WBC 8.9 10/16/2014   HGB 11.5  (L) 10/16/2014   HCT 36.9 10/16/2014   MCV 73.8 (L) 10/16/2014   PLT 315.0 10/16/2014   Lab Results  Component Value Date   FERRITIN 277 09/29/2011   Also has h/o Vit D deficiency.  Current Outpatient Medications on File Prior to Visit  Medication Sig Dispense Refill  . ferrous sulfate 325 (65 FE) MG tablet Take 1 tablet (325 mg total) by mouth 3 (three) times daily after meals. (Patient taking differently: Take 325 mg by mouth daily with breakfast. )  3  . fish oil-omega-3 fatty acids 1000 MG capsule Take 1 g by mouth as needed. Takes for joint pain    . Multiple Vitamin (MULTIVITAMIN) tablet Take 1 tablet by mouth daily.    . Turmeric 500 MG TABS Take 2 tablets by mouth daily.     No current facility-administered medications on file prior to visit.     Allergies  Allergen Reactions  . Leflunomide Other (See Comments)    Rash  Incontinence   . Adalimumab Swelling  . Methotrexate Derivatives Other (See Comments)    Elevated Liver Functions and Bruising  . Simponi [Golimumab] Rash    Past Medical History:  Diagnosis Date  . Anemia   . Arthritis   . HTN (hypertension)     Past Surgical History:  Procedure Laterality Date  . ABDOMINAL  HYSTERECTOMY     Partial- Uterus only  . bone spur - removal    . CESAREAN SECTION     x2  . PARTIAL HYSTERECTOMY  1/06   fibroids; ovaries left in   . TOTAL HIP ARTHROPLASTY Right 12/20/2012   Procedure: RIGHT TOTAL HIP ARTHROPLASTY ANTERIOR APPROACH;  Surgeon: Shelda PalMatthew D Olin, MD;  Location: [facility];  Service: Orthopedics;  Laterality: Right;  . TUBAL LIGATION      Family History  Problem Relation Age of Onset  . Depression Unknown        both sides of family   . Anemia Mother     Social History   Socioeconomic History  . Marital status: Married    Spouse name: Not on file  . Number of children: Not on file  . Years of education: Not on file  . Highest education level: Not on file  Occupational History  . Not on file    Social Needs  . Financial resource strain: Not on file  . Food insecurity:    Worry: Not on file    Inability: Not on file  . Transportation needs:    Medical: Not on file    Non-medical: Not on file  Tobacco Use  . Smoking status: Never Smoker  . Smokeless tobacco: Never Used  Substance and Sexual Activity  . Alcohol use: Yes    Comment: occassionally  . Drug use: No  . Sexual activity: Not on file  Lifestyle  . Physical activity:    Days per week: Not on file    Minutes per session: Not on file  . Stress: Not on file  Relationships  . Social connections:    Talks on phone: Not on file    Gets together: Not on file    Attends religious service: Not on file    Active member of club or organization: Not on file    Attends meetings of clubs or organizations: Not on file    Relationship status: Not on file  . Intimate partner violence:    Fear of current or ex partner: Not on file    Emotionally abused: Not on file    Physically abused: Not on file    Forced sexual activity: Not on file  Other Topics Concern  . Not on file  Social History Narrative   Married; 2 children, 1 at home.    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, and have been updated if relevant.   Review of Systems  Constitutional: Negative.   HENT: Negative.   Eyes: Negative.   Cardiovascular: Negative.   Gastrointestinal: Negative.   Genitourinary: Negative.   Musculoskeletal: Positive for myalgias.  Skin: Negative.   Neurological: Negative.   Endo/Heme/Allergies: Negative.   Psychiatric/Behavioral: Negative.   All other systems reviewed and are negative.    Physical Exam  Constitutional: She is oriented to person, place, and time and well-developed, well-nourished, and in no distress. No distress.  HENT:  Head: Normocephalic and atraumatic.  Eyes: Conjunctivae are normal.   Neck: Normal range of motion.  Cardiovascular: Normal rate.  Pulmonary/Chest: Effort normal.  Musculoskeletal:        General: No edema.     Comments: Hand deformity improved, not walking with cane.  Neurological: She is alert and oriented to person, place, and time.  Skin: Skin is warm. She is not diaphoretic.  Psychiatric: Mood, memory, affect and judgment normal.  Nursing note reviewed.   BP 116/60 (BP Location: Right Arm, Patient Position: Sitting, Cuff Size: Normal)   Pulse 91   Temp 97.8 F (36.6 C) (Oral)   Ht  (1.549 m)   Wt 193 lb 9.6 oz (87.8 kg)   SpO2 95%   BMI 36.58 kg/m

## 2018-02-24 LAB — ANTI-NUCLEAR AB-TITER (ANA TITER): ANA Titer 1: 1:160 {titer} — ABNORMAL HIGH

## 2018-02-24 LAB — IRON,TIBC AND FERRITIN PANEL
%SAT: 15 % — AB (ref 16–45)
Ferritin: 140 ng/mL (ref 16–232)
Iron: 40 ug/dL — ABNORMAL LOW (ref 45–160)
TIBC: 274 mcg/dL (calc) (ref 250–450)

## 2018-02-24 LAB — RHEUMATOID FACTOR: Rheumatoid fact SerPl-aCnc: 943 IU/mL — ABNORMAL HIGH (ref <14)

## 2018-02-24 LAB — ANA: Anti Nuclear Antibody(ANA): POSITIVE — AB

## 2018-03-21 ENCOUNTER — Ambulatory Visit
Admission: RE | Admit: 2018-03-21 | Discharge: 2018-03-21 | Disposition: A | Discharge status: Home / Self Care | Payer: BLUE CROSS/BLUE SHIELD | Source: Ambulatory Visit | Attending: Family Medicine | Admitting: Family Medicine

## 2018-03-21 DIAGNOSIS — Z1239 Encounter for other screening for malignant neoplasm of breast: Secondary | ICD-10-CM

## 2018-03-21 DIAGNOSIS — Z1231 Encounter for screening mammogram for malignant neoplasm of breast: Secondary | ICD-10-CM | POA: Diagnosis not present

## 2018-04-19 ENCOUNTER — Ambulatory Visit: Payer: BLUE CROSS/BLUE SHIELD | Admitting: Family Medicine

## 2018-04-19 ENCOUNTER — Ambulatory Visit (INDEPENDENT_AMBULATORY_CARE_PROVIDER_SITE_OTHER): Payer: BLUE CROSS/BLUE SHIELD

## 2018-04-19 ENCOUNTER — Encounter: Payer: Self-pay | Admitting: Family Medicine

## 2018-04-19 ENCOUNTER — Other Ambulatory Visit: Payer: Self-pay

## 2018-04-19 ENCOUNTER — Telehealth: Payer: Self-pay

## 2018-04-19 VITALS — BP 140/90 | HR 79 | Temp 97.9°F | Ht 61.0 in

## 2018-04-19 DIAGNOSIS — M170 Bilateral primary osteoarthritis of knee: Secondary | ICD-10-CM | POA: Diagnosis not present

## 2018-04-19 MED ORDER — PREDNISONE 5 MG PO TBEC: 5.0000 mg | DELAYED_RELEASE_TABLET | Freq: Every day | ORAL | 1 refills | Status: DC

## 2018-04-19 NOTE — Progress Notes (Signed)
Erin StarcherDarlene B Bass - 58 y.o. female MRN [id number]  Date of birth: 07/20/1960  SUBJECTIVE:  Including CC & ROS.  Chief Complaint  Patient presents with  . Pain    both knees painful/ left 2 1/2 week started, R 1 1/2 weeks pain, wants injections    Erin Bass is a 58 y.o. female that is presenting with acute on chronic worsening of her knee pain.  The pain is gotten significantly worse over the past 2 days.  She is having trouble walking and having to use the cane again.  The pain is constant.  The pain ranges from moderate to severe.  Has a history of similar pain.  No inciting event.  Has been started on daily prednisone use for her underlying rheumatoid arthritis.  She has been intolerant to several other medications.  Does report swelling of the joint.  No mechanical symptoms at this point.  Seems to be worse at certain times of the day.  She does get significant relief with the steroid injections.  Has tried gel injections with limited improvement..   ROS: No unexpected weight loss, fever, chills, swelling, instability, muscle pain, numbness/tingling, redness, otherwise see HPI   HISTORY: Past Medical, Surgical, Social, and Family History Reviewed & Updated per EMR.   Pertinent Historical Findings include:  Past Medical History:  Diagnosis Date  . Anemia   . Arthritis   . HTN (hypertension)     Past Surgical History:  Procedure Laterality Date  . ABDOMINAL HYSTERECTOMY     Partial- Uterus only  . bone spur - removal    . CESAREAN SECTION     x2  . PARTIAL HYSTERECTOMY  1/06   fibroids; ovaries left in   . TOTAL HIP ARTHROPLASTY Right 12/20/2012   Procedure: RIGHT TOTAL HIP ARTHROPLASTY ANTERIOR APPROACH;  Surgeon: Shelda PalMatthew D Olin, MD;  Location: [facility];  Service: Orthopedics;  Laterality: Right;  . TUBAL LIGATION      Allergies  Allergen Reactions  . Leflunomide Other (See Comments)    Rash  Incontinence   . Adalimumab Swelling  . Methotrexate Derivatives Other (See  Comments)    Elevated Liver Functions and Bruising  . Simponi [Golimumab] Rash    Family History  Problem Relation Age of Onset  . Anemia Mother   . Depression Other        both sides of family      Social History   Socioeconomic History  . Marital status: Married    Spouse name: Not on file  . Number of children: Not on file  . Years of education: Not on file  . Highest education level: Not on file  Occupational History  . Not on file  Social Needs  . Financial resource strain: Not on file  . Food insecurity:    Worry: Not on file    Inability: Not on file  . Transportation needs:    Medical: Not on file    Non-medical: Not on file  Tobacco Use  . Smoking status: Never Smoker  . Smokeless tobacco: Never Used  Substance and Sexual Activity  . Alcohol use: Yes    Comment: occassionally  . Drug use: No  . Sexual activity: Not on file  Lifestyle  . Physical activity:    Days per week: Not on file    Minutes per session: Not on file  . Stress: Not on file  Relationships  . Social connections:    Talks on phone: Not on file  Gets together: Not on file    Attends religious service: Not on file    Active member of club or organization: Not on file    Attends meetings of clubs or organizations: Not on file    Relationship status: Not on file  . Intimate partner violence:    Fear of current or ex partner: Not on file    Emotionally abused: Not on file    Physically abused: Not on file    Forced sexual activity: Not on file  Other Topics Concern  . Not on file  Social History Narrative   Married; 2 children, 1 at home.    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     PHYSICAL EXAM:  VS: BP 140/90   Pulse 79   Temp 97.9 F (36.6 C) (Oral)   Ht 5\' 1"  (1.549 m)   SpO2 97%   BMI 36.58 kg/m  Physical Exam Gen: NAD, alert, cooperative with exam, well-appearing ENT: normal lips, normal nasal mucosa,  Eye: normal EOM, normal conjunctiva and lids  CV:  no edema, +2 pedal pulses   Resp: no accessory muscle use, non-labored,  Skin: no rashes, no areas of induration  Neuro: normal tone, normal sensation to touch Psych:  normal insight, alert and oriented MSK:  Right and left knee:  Effusion in each knee with worse in the left  Normal ROm  Normal strength to resistance  Instability with valgus and varus testing No pain with patellar grind  Neurovascularly intact    Aspiration/Injection Procedure Note Erin Bass 05-Sep-1960  Procedure: Injection Indications: right knee pain   Procedure Details Consent: Risks of procedure as well as the alternatives and risks of each were explained to the (patient/caregiver).  Consent for procedure obtained. Time Out: Verified patient identification, verified procedure, site/side was marked, verified correct patient position, special equipment/implants available, medications/allergies/relevent history reviewed, required imaging and test results available.  Performed.  The area was cleaned with iodine and alcohol swabs.    The right knee superior lateral suprapatellar pouch was injected using 1 cc's of 40 mg Depo-Medrol and 4 cc's of 0.25% bupivacaine with a 22 1 1/2" needle.  Ultrasound was used. Images were obtained in long views showing the injection.     A sterile dressing was applied.  Patient did tolerate procedure well.   Aspiration/Injection Procedure Note Erin Bass 08/02/1960  Procedure: Injection Indications: left knee pain   Procedure Details Consent: Risks of procedure as well as the alternatives and risks of each were explained to the (patient/caregiver).  Consent for procedure obtained. Time Out: Verified patient identification, verified procedure, site/side was marked, verified correct patient position, special equipment/implants available, medications/allergies/relevent history reviewed, required imaging and test results available.  Performed.  The area was cleaned with  iodine and alcohol swabs.    The left knee superior lateral suprapatellar pouch was injected using 1 cc's of 40 mg Depo-Medrol and 4 cc's of 0.25% bupivacaine with a 22 1 1/2" needle.  Ultrasound was used. Images were obtained in ;pmg views showing the injection.     A sterile dressing was applied.  Patient did tolerate procedure well.       ASSESSMENT & PLAN:   Degenerative arthritis of knee, bilateral Acute on chronic worsening of her underlying symptoms.  Effusions noted but no aspiration attempted. -Injections bilaterally. -Provided Rayos.  She can try this in place of the prednisone.  Can continue if she feels less side effects from it. -  Counseled on supportive care. -Could consider imaging at some point.

## 2018-04-19 NOTE — Assessment & Plan Note (Signed)
Acute on chronic worsening of her underlying symptoms.  Effusions noted but no aspiration attempted. -Injections bilaterally. -Provided Rayos.  She can try this in place of the prednisone.  Can continue if she feels less side effects from it. -Counseled on supportive care. -Could consider imaging at some point.

## 2018-04-19 NOTE — Patient Instructions (Signed)
Good to see you  Please try to wait longer than 3 months for the next set of injections.  Please stop the prednisone and try the rayos. You can take this for a few weeks and see how it does for you. If you feel that it works better then you can continue it.  Please see [place] back if your knee pain doesn't improve.

## 2018-04-19 NOTE — Telephone Encounter (Signed)
PA started (Key: [id number]) waiting for results.

## 2018-04-21 NOTE — Telephone Encounter (Signed)
Dr. Jordan Likes please advise PA was denied. Do you have alternative?

## 2018-04-21 NOTE — Telephone Encounter (Signed)
Will hold off on rayos if not approved.   Myra Rude, MD [facility] 04/21/2018, 3:47 PM

## 2018-06-21 ENCOUNTER — Telehealth: Payer: Self-pay

## 2018-06-21 NOTE — Telephone Encounter (Signed)
Pt scheduled for 6/3

## 2018-06-21 NOTE — Telephone Encounter (Signed)
Left pt VM to call back, pt requested appt with Dr. Dayton Martes.  Copied from CRM [id number]. Topic: General - Other >> Jun 20, 2018  4:01 PM Jaquita Rector A wrote: Reason for CRM: Patient called to schedule an appointment with Dr Dayton Martes. Please call Ph# [phone]

## 2018-06-21 NOTE — Progress Notes (Signed)
Virtual Visit via Video   Due to the COVID-19 pandemic, this visit was completed with telemedicine (audio/video) technology to reduce patient and provider exposure as well as to preserve personal protective equipment.   I connected with Romelle Starcherarlene B Karney by a video enabled telemedicine application and verified that I am speaking with the correct person using two identifiers. Location patient: Home Location provider: [facility], Office Persons participating in the virtual visit: Mady Haagensenarlene B Study, Jahmari Esbenshade, MD   I discussed the limitations of evaluation and management by telemedicine and the availability of in person appointments. The patient expressed understanding and agreed to proceed.  Care Team   Patient Care Team: Dianne DunAron, Neo Yepiz M, MD as PCP - General  Subjective:   HPI:   Bilateral LE edema- started 3-4 days ago.  It is starting to improve but still painful to walk on her feet.  No change in color or warmth.  No SOB or CP.  Almost gone now that she soaked in epsom salts- "almost normal."  RA- diagnosed in 2013 and has been to numerous rheumatologists during that time. Also has had adverse rxs to several rxs including MTX , leflunomide, and Simponi. Now more recently humira and enbrel.    Tramadol andprednisone seem to give her the most relief. Copay to see rheumatology is high and every rx she has tried was either too costly or had an adverse reaction. Sheasked if I would prescribedprednisone as that seems to be the only rx she can tolerate.  Given complexity of her situation- severe RA with compromised quality of life who has tried multiple rx.  We discussed therisks of long term steroid use and how bursts can lead to adrenal failure. She is aware of other risks as well which we discussed today of long term steroid use, such as but not limited to elevated blood sugar, weight gain, osteoporosis. We agreed to a trial of low dose, daily prednisone,5 mg daily, not a  burst but a scheduled daily medication.Today she would like to discuss going back to tapers/bursts of prednisone.    Review of Systems  Constitutional: Negative.   HENT: Negative.   Eyes: Negative.   Respiratory: Negative for sputum production and shortness of breath.   Cardiovascular: Positive for leg swelling. Negative for chest pain, palpitations, orthopnea, claudication and PND.  Gastrointestinal: Negative.   Genitourinary: Negative.   Musculoskeletal: Positive for joint pain.  Skin: Negative.   Neurological: Negative.   Psychiatric/Behavioral: Negative.   All other systems reviewed and are negative.    Patient Active Problem List   Diagnosis Date Noted  . Bilateral lower extremity edema 06/22/2018  . Degenerative arthritis of knee, bilateral 04/26/2017  . 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 Rheumatoid arthritis involving multiple joints (HCC) 10/22/2011  . POSTMENOPAUSAL STATUS 03/28/2009  . HLD (hyperlipidemia) 01/24/2008  . Essential hypertension 09/06/2006    Social History   Tobacco Use  . Smoking status: Never Smoker  . Smokeless tobacco: Never Used  Substance Use Topics  . Alcohol use: Yes    Comment: occassionally    Current Outpatient Medications:  .  ferrous sulfate 325 (65 FE) MG tablet, Take 1 tablet (325 mg total) by mouth 3 (three) times daily after meals. (Patient taking differently: Take 325 mg by mouth daily with breakfast. ), Disp: , Rfl: 3 .  fish oil-omega-3 fatty acids 1000 MG capsule, Take 1 g by mouth as needed. Takes for joint pain,  Disp: , Rfl:  .  hydrochlorothiazide (HYDRODIURIL) 25 MG tablet, Take 1 tablet (25 mg total) by mouth daily., Disp: 90 tablet, Rfl: 3 .  Multiple Vitamin (MULTIVITAMIN) tablet, Take 1 tablet by mouth daily., Disp: , Rfl:  .  predniSONE (DELTASONE) 5 MG tablet, Take 1.5 tablets (7.5 mg total) by mouth daily with breakfast. (Patient taking differently: Take 5 mg by mouth  daily with breakfast. ), Disp: 45 tablet, Rfl: 11 .  traMADol (ULTRAM) 50 MG tablet, Take 1 tablet (50 mg total) by mouth every 6 (six) hours., Disp: 60 tablet, Rfl: 5 .  Turmeric 500 MG TABS, Take 2 tablets by mouth daily., Disp: , Rfl:   Allergies  Allergen Reactions  . Leflunomide Other (See Comments)    Rash  Incontinence   . Adalimumab Swelling  . Methotrexate Derivatives Other (See Comments)    Elevated Liver Functions and Bruising  . Simponi [Golimumab] Rash    Objective:   VITALS: Per patient if applicable, see vitals. GENERAL: Alert, appears well and in no acute distress. HEENT: Atraumatic, conjunctiva clear, no obvious abnormalities on inspection of external nose and ears. NECK: Normal movements of the head and neck. CARDIOPULMONARY: No increased WOB. Speaking in clear sentences. I:E ratio WNL.  MS: Moves all visible extremities without noticeable abnormality. PSYCH: Pleasant and cooperative, well-groomed. Speech normal rate and rhythm. Affect is appropriate. Insight and judgement are appropriate. Attention is focused, linear, and appropriate.  NEURO: CN grossly intact. Oriented as arrived to appointment on time with no prompting. Moves both UE equally.  SKIN: No obvious lesions, wounds, erythema, or cyanosis noted on face or hands.  Depression screen [facility] 2/9 02/21/2018 11/15/2017 11/12/2016  Decreased Interest 0 0 0  Down, Depressed, Hopeless 0 0 0  PHQ - 2 Score 0 0 0    Assessment and Plan:   Keyah was seen today for edema and rheumatoid arthritis.  Diagnoses and all orders for this visit:  Rheumatoid arthritis involving multiple joints (HCC)  Bilateral lower extremity edema    . COVID-19 Education: The signs and symptoms of COVID-19 were discussed with the patient and how to seek care for testing if needed. The importance of social distancing was discussed today. . Reviewed expectations re: course of current medical issues. . Discussed self-management of  symptoms. . Outlined signs and symptoms indicating need for more acute intervention. . Patient verbalized understanding and all questions were answered. Marland Kitchen Health Maintenance issues including appropriate healthy diet, exercise, and smoking avoidance were discussed with patient. . See orders for this visit as documented in the electronic medical record.  Ruthe Mannan, MD  Records requested if needed. Time spent: 25  minutes, of which >50% was spent in obtaining information about her symptoms, reviewing her previous labs, evaluations, and treatments, counseling her about her condition (please see the discussed topics above), and developing a plan to further investigate it; she had a number of questions which I addressed.

## 2018-06-22 ENCOUNTER — Encounter: Payer: Self-pay | Admitting: Family Medicine

## 2018-06-22 ENCOUNTER — Telehealth (INDEPENDENT_AMBULATORY_CARE_PROVIDER_SITE_OTHER): Payer: BC Managed Care – PPO | Admitting: Family Medicine

## 2018-06-22 VITALS — Wt 187.0 lb

## 2018-06-22 DIAGNOSIS — M069 Rheumatoid arthritis, unspecified: Secondary | ICD-10-CM | POA: Diagnosis not present

## 2018-06-22 DIAGNOSIS — R600 Localized edema: Secondary | ICD-10-CM | POA: Diagnosis not present

## 2018-06-22 MED ORDER — PREDNISONE 10 MG PO TABS: ORAL_TABLET | ORAL | 0 refills | Status: DC

## 2018-06-22 NOTE — Assessment & Plan Note (Signed)
Resolved.   Call or send my chart message prn if these symptoms worsen or fail to improve as anticipated.

## 2018-06-22 NOTE — Assessment & Plan Note (Addendum)
>  25 minutes spent in face to face time with patient, >50% spent in counselling or coordination of care discussing her RA treatment and LE edema. She does want to stop taking prednisone 5 mg despite what we discussed - advised her her to wean off of prednisone 5 mg by taking one tablet every other day for one week, then 1 tablet every 2-3 days for one week, then 1/2 tablet 2.5 mg every few days for one week and stop.  She will go ahead and take a short steroid burst now since her symptoms are worsening.  Call or send my chart message prn if these symptoms worsen or fail to improve as anticipated. The patient indicates understanding of these issues and agrees with the plan.

## 2018-07-11 ENCOUNTER — Encounter: Payer: Self-pay | Admitting: Family Medicine

## 2018-07-25 ENCOUNTER — Encounter: Payer: Self-pay | Admitting: Family Medicine

## 2018-07-25 ENCOUNTER — Other Ambulatory Visit: Payer: Self-pay | Admitting: Family Medicine

## 2018-07-25 MED ORDER — PREDNISONE 10 MG PO TABS: ORAL_TABLET | ORAL | 0 refills | Status: DC

## 2018-08-29 ENCOUNTER — Other Ambulatory Visit: Payer: Self-pay | Admitting: Family Medicine

## 2018-08-30 ENCOUNTER — Encounter: Payer: Self-pay | Admitting: Family Medicine

## 2018-09-01 ENCOUNTER — Ambulatory Visit (INDEPENDENT_AMBULATORY_CARE_PROVIDER_SITE_OTHER): Payer: BC Managed Care – PPO | Admitting: Family Medicine

## 2018-09-01 ENCOUNTER — Encounter: Payer: Self-pay | Admitting: Family Medicine

## 2018-09-01 DIAGNOSIS — M069 Rheumatoid arthritis, unspecified: Secondary | ICD-10-CM | POA: Diagnosis not present

## 2018-09-01 MED ORDER — PREDNISONE 10 MG PO TABS: ORAL_TABLET | ORAL | 0 refills | Status: DC

## 2018-09-01 NOTE — Progress Notes (Signed)
Virtual Visit via Video   Due to the COVID-19 pandemic, this visit was completed with telemedicine (audio/video) technology to reduce patient and provider exposure as well as to preserve personal protective equipment.   I connected with Erin Bass by a video enabled telemedicine application and verified that I am speaking with the correct person using two identifiers. Location patient: Home Location provider: [facility], Office Persons participating in the virtual visit: [name], MD   I discussed the limitations of evaluation and management by telemedicine and the availability of in person appointments. The patient expressed understanding and agreed to proceed.  Care Team   Patient Care Team: [name], MD as PCP - General  Subjective:   HPI:  RA- last saw pt for for this 06/22/18-   RA- diagnosed in 2013 and has been to numerous rheumatologists during that time. Also has had adverse rxs to several rxs including MTX , leflunomide, and Simponi. Now more recently humira and enbrel.   Tramadol andprednisone seems to give her the most relief, although Tramadol has been causing headaches of late.   Copay to see rheumatology is high and every rx she has tried was either too costly or had an adverse reaction. Sheasked if I would prescribedprednisone as that seems to be the only rx she can tolerate.  Given complexity of her situation- severe RA with compromised quality of life who has tried multiple rx.  We discussed therisks of long term steroid use and how bursts can lead to adrenal failure. She is aware of other risks as well of long term steroid use, such as but not limited to elevated blood sugar, weight gain, osteoporosis. We agreed to a trial of low dose, daily prednisone,5 mg daily, not a burst but a scheduled daily medication but at that office visit she wanted a trial of steroid bursts every few months.  Pt sent the following message  yesterday-  "Erin Bass,  I have a question about my predisone. I started with the medication the first week in July, I noticed the first morning in takings the 30 dosage I started to feel good,  the inflammation in my ankle went down and I was able to control my bladder,  I felt good.  Right now I'm starting to swell again, so I want to know if I start another taper, can I do 30MG   for three to four days instead of five and when taper down, I need 10 or 20mg  per day,  my body needs this for the inflammation since I'm not treating the RA. I'm debating if I should  start another treatment program for arthritis, but I'm also worried about the side effects.  Thank you."  I wrote her back asking her to make this appointment sending her the following note:  Hi [name], We can't keep you on high dose bursts of prednisone like this.  We will cause your bones to get weak, diabetes, adrenal insuffiencey.  I worry about doing more tapers back to back but I know you have tried so many treatments.  What are your thoughts?  Maybe we should schedule you for a virtual visit tomorrow.  Does 11:20 work for you?  Or any time tomorrow?  WE could even just talk on the phone about it. Let me know. [name]  Review of Systems  Constitutional: Positive for malaise/fatigue.  Musculoskeletal: Positive for joint pain.  All other systems reviewed and are negative.  Patient Active Problem List   Diagnosis Date Noted  . Bilateral lower extremity edema 06/22/2018  . Degenerative arthritis of knee, bilateral 04/26/2017  . 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 Rheumatoid arthritis involving multiple joints (HCC) 10/22/2011  . POSTMENOPAUSAL STATUS 03/28/2009  . HLD (hyperlipidemia) 01/24/2008  . Essential hypertension 09/06/2006    Social History   Tobacco Use  . Smoking status: Never Smoker  . Smokeless tobacco: Never Used  Substance Use Topics  . Alcohol use: Yes    Comment:  occassionally    Current Outpatient Medications:  .  ferrous sulfate 325 (65 FE) MG tablet, Take 1 tablet (325 mg total) by mouth 3 (three) times daily after meals. (Patient taking differently: Take 325 mg by mouth daily with breakfast. ), Disp: , Rfl: 3 .  fish oil-omega-3 fatty acids 1000 MG capsule, Take 1 g by mouth as needed. Takes for joint pain, Disp: , Rfl:  .  hydrochlorothiazide (HYDRODIURIL) 25 MG tablet, Take 1 tablet (25 mg total) by mouth daily., Disp: 90 tablet, Rfl: 3 .  Multiple Vitamin (MULTIVITAMIN) tablet, Take 1 tablet by mouth daily., Disp: , Rfl:  .  predniSONE (DELTASONE) 10 MG tablet, 3 tabs by mouth x 5 days, 2 tabs by mouth x 5 days, 1 tab by mouth x 5 day, 1/2 tab by mouth for 5 days, Disp: 40 tablet, Rfl: 0 .  traMADol (ULTRAM) 50 MG tablet, TAKE 1 TABLET BY MOUTH EVERY 6 HOURS, Disp: 60 tablet, Rfl: 0 .  Turmeric 500 MG TABS, Take 2 tablets by mouth daily., Disp: , Rfl:   Allergies  Allergen Reactions  . Leflunomide Other (See Comments)    Rash  Incontinence   . Adalimumab Swelling  . Methotrexate Derivatives Other (See Comments)    Elevated Liver Functions and Bruising  . Simponi [Golimumab] Rash    Objective:  There were no vitals taken for this visit.  VITALS: Per patient if applicable, see vitals. GENERAL: Alert, appears well and in no acute distress. HEENT: Atraumatic, conjunctiva clear, no obvious abnormalities on inspection of external nose and ears. NECK: Normal movements of the head and neck. CARDIOPULMONARY: No increased WOB. Speaking in clear sentences. I:E ratio WNL.  MS: Moves all visible extremities without noticeable abnormality. PSYCH: Pleasant and cooperative, well-groomed. Speech normal rate and rhythm. Affect is appropriate. Insight and judgement are appropriate. Attention is focused, linear, and appropriate.  NEURO: CN grossly intact. Oriented as arrived to appointment on time with no prompting. Moves both UE equally.  SKIN: No  obvious lesions, wounds, erythema, or cyanosis noted on face or hands.  Depression screen [facility] 2/9 02/21/2018 11/15/2017 11/12/2016  Decreased Interest 0 0 0  Down, Depressed, Hopeless 0 0 0  PHQ - 2 Score 0 0 0    Assessment and Plan:   Myrikal was seen today for rheumatoid arthritis.  Diagnoses and all orders for this visit:  Rheumatoid arthritis involving multiple joints (HCC)    . COVID-19 Education: The signs and symptoms of COVID-19 were discussed with the patient and how to seek care for testing if needed. The importance of social distancing was discussed today. . Reviewed expectations re: course of current medical issues. . Discussed self-management of symptoms. . Outlined signs and symptoms indicating need for more acute intervention. . Patient verbalized understanding and all questions were answered. Marland Kitchen Health Maintenance issues including appropriate healthy diet, exercise, and smoking avoidance were discussed with patient. . See orders  for this visit as documented in the electronic medical record.  Ruthe Mannan, MD  Records requested if needed. Time spent: 25 minutes, of which >50% was spent in obtaining information about her symptoms, reviewing her previous labs, evaluations, and treatments, counseling her about her condition (please see the discussed topics above), and developing a plan to further investigate it; she had a number of questions which I addressed.

## 2018-09-01 NOTE — Assessment & Plan Note (Signed)
>  25 minutes spent in face to face time with patient, >50% spent in counselling or coordination of care discussing treatment plan.  I am concerned about her joints eroding and she is aware that I want her to take an rx for RA.  We agreed the best course of action would be referral to pain management and then a prednisone burst follow by low dose daily prednisone until we can get further recommendations from pain management. The patient indicates understanding of these issues and agrees with the plan. Orders Placed This Encounter  Procedures  . Ambulatory referral to Pain Clinic

## 2018-09-21 DIAGNOSIS — Z79899 Other long term (current) drug therapy: Secondary | ICD-10-CM | POA: Diagnosis not present

## 2018-09-25 ENCOUNTER — Other Ambulatory Visit: Payer: Self-pay | Admitting: Family Medicine

## 2018-09-27 ENCOUNTER — Encounter: Payer: Self-pay | Admitting: Family Medicine

## 2018-10-24 ENCOUNTER — Other Ambulatory Visit: Payer: Self-pay | Admitting: Family Medicine

## 2018-10-27 DIAGNOSIS — M0579 Rheumatoid arthritis with rheumatoid factor of multiple sites without organ or systems involvement: Secondary | ICD-10-CM | POA: Diagnosis not present

## 2018-10-27 DIAGNOSIS — Z79899 Other long term (current) drug therapy: Secondary | ICD-10-CM | POA: Diagnosis not present

## 2018-10-27 DIAGNOSIS — Z8615 Personal history of latent tuberculosis infection: Secondary | ICD-10-CM | POA: Diagnosis not present

## 2018-11-19 ENCOUNTER — Other Ambulatory Visit: Payer: Self-pay | Admitting: Family Medicine

## 2018-11-21 NOTE — Telephone Encounter (Signed)
TA-Plz see refill req for Tramadol 50mg  #60 per 30d/last filled 10.06.20 per [place] PMP no red flags pt is compliant/thx dmf

## 2018-11-26 DIAGNOSIS — R300 Dysuria: Secondary | ICD-10-CM | POA: Diagnosis not present

## 2018-11-26 DIAGNOSIS — R1084 Generalized abdominal pain: Secondary | ICD-10-CM | POA: Diagnosis not present

## 2018-11-26 DIAGNOSIS — Z1159 Encounter for screening for other viral diseases: Secondary | ICD-10-CM | POA: Diagnosis not present

## 2018-12-05 ENCOUNTER — Encounter: Payer: Self-pay | Admitting: Family Medicine

## 2019-01-05 ENCOUNTER — Encounter: Payer: Self-pay | Admitting: Family Medicine

## 2019-01-11 ENCOUNTER — Encounter: Payer: Self-pay | Admitting: Family Medicine

## 2019-01-12 ENCOUNTER — Other Ambulatory Visit: Payer: Self-pay | Admitting: Family Medicine

## 2019-01-12 NOTE — Progress Notes (Signed)
Letter sent to patient through my chart.

## 2019-01-27 DIAGNOSIS — M0579 Rheumatoid arthritis with rheumatoid factor of multiple sites without organ or systems involvement: Secondary | ICD-10-CM | POA: Diagnosis not present

## 2019-01-27 DIAGNOSIS — Z79899 Other long term (current) drug therapy: Secondary | ICD-10-CM | POA: Diagnosis not present

## 2019-02-28 ENCOUNTER — Other Ambulatory Visit: Payer: Self-pay | Admitting: Family Medicine

## 2019-03-21 ENCOUNTER — Other Ambulatory Visit: Payer: Self-pay | Admitting: Internal Medicine

## 2019-03-21 DIAGNOSIS — Z114 Encounter for screening for human immunodeficiency virus [HIV]: Secondary | ICD-10-CM | POA: Diagnosis not present

## 2019-03-21 DIAGNOSIS — Z1231 Encounter for screening mammogram for malignant neoplasm of breast: Secondary | ICD-10-CM

## 2019-03-21 DIAGNOSIS — R5383 Other fatigue: Secondary | ICD-10-CM | POA: Diagnosis not present

## 2019-03-21 DIAGNOSIS — Z0000 Encounter for general adult medical examination without abnormal findings: Secondary | ICD-10-CM | POA: Diagnosis not present

## 2019-03-21 DIAGNOSIS — R300 Dysuria: Secondary | ICD-10-CM | POA: Diagnosis not present

## 2019-03-21 DIAGNOSIS — Z20822 Contact with and (suspected) exposure to covid-19: Secondary | ICD-10-CM | POA: Diagnosis not present

## 2019-03-21 DIAGNOSIS — M859 Disorder of bone density and structure, unspecified: Secondary | ICD-10-CM | POA: Diagnosis not present

## 2019-03-21 DIAGNOSIS — Z131 Encounter for screening for diabetes mellitus: Secondary | ICD-10-CM | POA: Diagnosis not present

## 2019-03-21 DIAGNOSIS — Z1331 Encounter for screening for depression: Secondary | ICD-10-CM | POA: Diagnosis not present

## 2019-03-21 DIAGNOSIS — R0602 Shortness of breath: Secondary | ICD-10-CM | POA: Diagnosis not present

## 2019-03-21 DIAGNOSIS — M7989 Other specified soft tissue disorders: Secondary | ICD-10-CM | POA: Diagnosis not present

## 2019-04-04 DIAGNOSIS — R1084 Generalized abdominal pain: Secondary | ICD-10-CM | POA: Diagnosis not present

## 2019-04-04 DIAGNOSIS — E7800 Pure hypercholesterolemia, unspecified: Secondary | ICD-10-CM | POA: Diagnosis not present

## 2019-04-04 DIAGNOSIS — Z114 Encounter for screening for human immunodeficiency virus [HIV]: Secondary | ICD-10-CM | POA: Diagnosis not present

## 2019-04-04 DIAGNOSIS — Z20822 Contact with and (suspected) exposure to covid-19: Secondary | ICD-10-CM | POA: Diagnosis not present

## 2019-04-04 LAB — HEPATIC FUNCTION PANEL
ALT: 12 (ref 7–35)
AST: 16 (ref 13–35)
Alkaline Phosphatase: 55 (ref 25–125)

## 2019-04-04 LAB — COMPREHENSIVE METABOLIC PANEL
Albumin: 3.9 (ref 3.5–5.0)
Calcium: 9.8 (ref 8.7–10.7)
Globulin: 2.9

## 2019-04-04 LAB — BASIC METABOLIC PANEL
BUN: 26 — AB (ref 4–21)
CO2: 23 — AB (ref 13–22)
Chloride: 95 — AB (ref 99–108)
Creatinine: 1.1 (ref 0.5–1.1)
Glucose: 91
Potassium: 3.5 (ref 3.4–5.3)
Sodium: 143 (ref 137–147)

## 2019-04-04 LAB — LIPID PANEL
Cholesterol: 256 — AB (ref 0–200)
HDL: 100 — AB (ref 35–70)
LDL Cholesterol: 134
Triglycerides: 108 (ref 40–160)

## 2019-04-04 LAB — TSH: TSH: 1.42 (ref 0.41–5.90)

## 2019-04-06 DIAGNOSIS — M7989 Other specified soft tissue disorders: Secondary | ICD-10-CM | POA: Diagnosis not present

## 2019-04-10 DIAGNOSIS — R0602 Shortness of breath: Secondary | ICD-10-CM | POA: Diagnosis not present

## 2019-04-10 DIAGNOSIS — R9431 Abnormal electrocardiogram [ECG] [EKG]: Secondary | ICD-10-CM | POA: Diagnosis not present

## 2019-04-17 ENCOUNTER — Other Ambulatory Visit: Payer: Self-pay

## 2019-04-17 ENCOUNTER — Ambulatory Visit
Admission: RE | Admit: 2019-04-17 | Discharge: 2019-04-17 | Disposition: A | Discharge status: Home / Self Care | Payer: BC Managed Care – PPO | Source: Ambulatory Visit | Attending: Internal Medicine | Admitting: Internal Medicine

## 2019-04-17 DIAGNOSIS — Z1231 Encounter for screening mammogram for malignant neoplasm of breast: Secondary | ICD-10-CM | POA: Diagnosis not present

## 2019-04-24 DIAGNOSIS — M069 Rheumatoid arthritis, unspecified: Secondary | ICD-10-CM | POA: Diagnosis not present

## 2019-04-24 DIAGNOSIS — D539 Nutritional anemia, unspecified: Secondary | ICD-10-CM | POA: Diagnosis not present

## 2019-04-24 DIAGNOSIS — G8929 Other chronic pain: Secondary | ICD-10-CM | POA: Diagnosis not present

## 2019-04-24 DIAGNOSIS — I10 Essential (primary) hypertension: Secondary | ICD-10-CM | POA: Diagnosis not present

## 2019-05-08 DIAGNOSIS — M069 Rheumatoid arthritis, unspecified: Secondary | ICD-10-CM | POA: Diagnosis not present

## 2019-05-08 DIAGNOSIS — F329 Major depressive disorder, single episode, unspecified: Secondary | ICD-10-CM | POA: Diagnosis not present

## 2019-05-08 DIAGNOSIS — M25562 Pain in left knee: Secondary | ICD-10-CM | POA: Diagnosis not present

## 2019-05-08 DIAGNOSIS — M25561 Pain in right knee: Secondary | ICD-10-CM | POA: Diagnosis not present

## 2019-05-10 ENCOUNTER — Ambulatory Visit: Payer: BC Managed Care – PPO | Admitting: Family Medicine

## 2019-05-10 ENCOUNTER — Encounter: Payer: Self-pay | Admitting: Family Medicine

## 2019-05-10 ENCOUNTER — Other Ambulatory Visit: Payer: Self-pay

## 2019-05-10 VITALS — BP 140/90 | HR 93 | Temp 98.2°F | Ht 61.0 in | Wt 194.0 lb

## 2019-05-10 DIAGNOSIS — M069 Rheumatoid arthritis, unspecified: Secondary | ICD-10-CM | POA: Diagnosis not present

## 2019-05-10 DIAGNOSIS — M170 Bilateral primary osteoarthritis of knee: Secondary | ICD-10-CM | POA: Diagnosis not present

## 2019-05-10 MED ORDER — METHYLPREDNISOLONE ACETATE 40 MG/ML IJ SUSP
80.0000 mg | Freq: Once | INTRAMUSCULAR | Status: AC
  Administered 2019-05-10: 12:00:00 80 mg via INTRA_ARTICULAR

## 2019-05-10 NOTE — Progress Notes (Signed)
Rasaan Brotherton T. Siearra Amberg, MD, [place] at [facility] [place], [address]  Phone: [phone]  FAX: [phone]  BRESHAY ILG - 59 y.o. female  MRN [id number]  Date of Birth: Aug 24, 1960  Date: 05/10/2019  PCP: [name], MD  Referral: [name], MD  Chief Complaint  Patient presents with  . Knee Pain    Bilateral    This visit occurred during the SARS-CoV-2 public health emergency.  Safety protocols were in place, including screening questions prior to the visit, additional usage of staff PPE, and extensive cleaning of exam room while observing appropriate contact time as indicated for disinfecting solutions.   Subjective:   KORAYMA HAGWOOD is a 59 y.o. very pleasant female patient with Body mass index is 36.66 kg/m. who presents with the following:  B knee pain in a setting of RA.  She has known longstanding rheumatoid arthritis, and she needs to have seen a number of different rheumatologist in the past, and she has failed multiple DMARDs including methotrexate and a number of other Biologics.  She was managed with some low-dose oral prednisone for some time with one of my partners and occasional prednisone burst.  She is seeing a new doctor now and some meloxicam does help quite a bit with her symptoms, but her knees are bothering her quite a bit.  She saw one of my partners 1 year ago, and at that point he did inject both of her knees with some corticosteroid.  She had some good relief of her symptoms at that point.  Review of Systems is noted in the HPI, as appropriate   Objective:   BP 140/90   Pulse 93   Temp 98.2 F (36.8 C) (Temporal)   Ht 5\' 1"  (1.549 m)   Wt 194 lb (88 kg)   SpO2 97%   BMI 36.66 kg/m   GEN: No acute distress; alert,appropriate. PULM: Breathing comfortably in no respiratory distress PSYCH: Normally interactive.   Bilateral knee effusions.   Lacks 3 degrees of extension and flexion to 105 degrees.  Stable to varus and valgus stress with significant medial greater than lateral joint line tenderness.  ACL, PCL, MCL, and LCL are intact.  All flexion causes pain.  Also pain with forced extension.  Radiology:   Assessment and Plan:     ICD-10-CM   1. Rheumatoid arthritis involving multiple joints (HCC)  M06.9   2. Primary osteoarthritis of both knees  M17.0 methylPREDNISolone acetate (DEPO-MEDROL) injection 80 mg    methylPREDNISolone acetate (DEPO-MEDROL) injection 80 mg   The primary issue here is rheumatoid arthritis.  Ideally the patient would find a DMARD that would control her symptoms.  Challenging case.  She is followed by rheumatology and I will completely defer her management to this from an RA standpoint.  Did to bilateral knee injections today which will likely provide her some relief.  Aspiration/Injection Procedure Note CAMIE HAUSS 1960-08-08 Date of procedure: 05/10/2019  Procedure: Large Joint Joint Aspiration / Injection of the Right Knee Indications: Pain  Procedure Details Patient verbally consented to procedure. Risks (including potential rare risk of infection), benefits, and alternatives explained. Sterilely prepped with Chloraprep. Ethyl cholride used for anesthesia. 8 cc Lidocaine 1% mixed with 2 mL Depo-Medrol 40 mg injected using the anteromedial approach without difficulty. No complications with procedure and tolerated well. Patient had decreased pain post-injection.  Medication: 2 mL of  Depo-Medrol 40 mg, equaling Depo-Medrol 80 mg total  Aspiration/Injection Procedure Note CARLOTA PHILLEY 1960-02-26 Date of procedure: 05/10/2019  Procedure: Large Joint Aspiration / Injection of the Left Knee Indications: Pain  Procedure Details Patient verbally consented to procedure. Risks (including potential rare risk of infection), benefits, and alternatives explained. Sterilely prepped with Chloraprep.  Ethyl cholride used for anesthesia. 8 cc Lidocaine 1% mixed with 2 mL Depo-Medrol 40 mg injected using the anteromedial approach without difficulty. No complications with procedure and tolerated well. Patient had decreased pain post-injection.  Medication: 2 mL of Depo-Medrol 40 mg, equaling Depo-Medrol 80 mg total  Follow-up: No follow-ups on file.  Meds ordered this encounter  Medications  . methylPREDNISolone acetate (DEPO-MEDROL) injection 80 mg  . methylPREDNISolone acetate (DEPO-MEDROL) injection 80 mg   Medications Discontinued During This Encounter  Medication Reason  . Turmeric 500 MG TABS Duplicate  . fish oil-omega-3 fatty acids 1000 MG capsule Completed Course  . Multiple Vitamin (MULTIVITAMIN) tablet Completed Course  . predniSONE (DELTASONE) 10 MG tablet Completed Course  . ferrous sulfate 325 (65 FE) MG tablet Duplicate   No orders of the defined types were placed in this encounter.   Signed,  Elpidio Galea. Alonte Wulff, MD   Outpatient Encounter Medications as of 05/10/2019  Medication Sig  . Black Pepper-Turmeric (TURMERIC COMPLEX/BLACK PEPPER) 3-500 MG CAPS Take by mouth.  . ferrous sulfate 325 (65 FE) MG tablet Take 325 mg by mouth daily with breakfast.  . hydrochlorothiazide (HYDRODIURIL) 25 MG tablet Take 1 tablet by mouth once daily  . meloxicam (MOBIC) 15 MG tablet Take by mouth.  . traMADol (ULTRAM) 50 MG tablet TAKE 1 TABLET BY MOUTH EVERY 6 HOURS  . [DISCONTINUED] ferrous sulfate 325 (65 FE) MG tablet Take 1 tablet (325 mg total) by mouth 3 (three) times daily after meals. (Patient taking differently: Take 325 mg by mouth daily with breakfast. )  . [DISCONTINUED] fish oil-omega-3 fatty acids 1000 MG capsule Take 1 g by mouth as needed. Takes for joint pain  . [DISCONTINUED] Multiple Vitamin (MULTIVITAMIN) tablet Take 1 tablet by mouth daily.  . [DISCONTINUED] predniSONE (DELTASONE) 10 MG tablet 3 tabs by mouth x 3 days, 2 tabs by mouth x 3 days, 1 tab by mouth x 3  days, 1/2 tab by mouth daily until discontinued.  . [DISCONTINUED] Turmeric 500 MG TABS Take 2 tablets by mouth daily.  . [EXPIRED] methylPREDNISolone acetate (DEPO-MEDROL) injection 80 mg   . [EXPIRED] methylPREDNISolone acetate (DEPO-MEDROL) injection 80 mg    No facility-administered encounter medications on file as of 05/10/2019.

## 2019-05-30 ENCOUNTER — Other Ambulatory Visit: Payer: Self-pay | Admitting: *Deleted

## 2019-05-30 MED ORDER — TRAMADOL HCL 50 MG PO TABS: 50.0000 mg | ORAL_TABLET | Freq: Four times a day (QID) | ORAL | 1 refills | Status: DC

## 2019-05-30 NOTE — Telephone Encounter (Signed)
Last office visit 05/10/2019 for knee pain with Dr. Patsy Lager.  Patient of Dr. Dayton Martes.  Last refilled 11/21/2018 for #60 with 5 refills by Dr. Dayton Martes.  No future appointments.  Refill?

## 2019-05-30 NOTE — Telephone Encounter (Signed)
I am comfortable managing this for now until the patient gets a new primary care doctor.  Would you please call her and let her know that she should get a new primary care doctor since Dr. Dayton Martes is no longer in practice.

## 2019-05-30 NOTE — Telephone Encounter (Signed)
Erin Bass notified as instructed by telephone.  She states that she will get established with new PCP hopefully sometime in June.

## 2019-06-13 ENCOUNTER — Other Ambulatory Visit: Payer: Self-pay

## 2019-06-14 ENCOUNTER — Ambulatory Visit: Payer: BC Managed Care – PPO | Admitting: Family Medicine

## 2019-06-14 ENCOUNTER — Encounter: Payer: Self-pay | Admitting: Family Medicine

## 2019-06-14 VITALS — BP 158/100 | HR 84 | Temp 96.9°F | Ht 61.0 in | Wt 190.0 lb

## 2019-06-14 DIAGNOSIS — I10 Essential (primary) hypertension: Secondary | ICD-10-CM | POA: Diagnosis not present

## 2019-06-14 DIAGNOSIS — M170 Bilateral primary osteoarthritis of knee: Secondary | ICD-10-CM | POA: Diagnosis not present

## 2019-06-14 DIAGNOSIS — E611 Iron deficiency: Secondary | ICD-10-CM

## 2019-06-14 DIAGNOSIS — M069 Rheumatoid arthritis, unspecified: Secondary | ICD-10-CM

## 2019-06-14 DIAGNOSIS — D649 Anemia, unspecified: Secondary | ICD-10-CM

## 2019-06-14 MED ORDER — MELOXICAM 15 MG PO TABS: 15.0000 mg | ORAL_TABLET | Freq: Every day | ORAL | 0 refills | Status: DC

## 2019-06-14 MED ORDER — CHLORTHALIDONE 25 MG PO TABS: 25.0000 mg | ORAL_TABLET | Freq: Every day | ORAL | 1 refills | Status: DC

## 2019-06-14 NOTE — Progress Notes (Addendum)
Established Patient Office Visit  Subjective:  [name], female    DOB: 1960-04-26  Age: 59 y.o. MRN: [id number]  CC:  Chief Complaint  Patient presents with  . Establish Care    Pt here for TOC, pt c/o knee pain/swelling bilateral going on for a long time.  [facility]    HPI Erin Bass presents for follow-up of her hypertension and anemia and bilateral knee pain.  Currently taking HCTZ for her blood pressure without issue.  She has been taking iron regularly over the last few months.  Longstanding history of rheumatoid arthritis with associated bilateral knee pain.  Is responded to prednisone and meloxicam.  She has had a difficult time finding a rheumatologist.  Past Medical History:  Diagnosis Date  . Anemia   . Arthritis   . HTN (hypertension)     Past Surgical History:  Procedure Laterality Date  . ABDOMINAL HYSTERECTOMY     Partial- Uterus only  . bone spur - removal    . CESAREAN SECTION     x2  . PARTIAL HYSTERECTOMY  1/06   fibroids; ovaries left in   . TOTAL HIP ARTHROPLASTY Right 12/20/2012   Procedure: RIGHT TOTAL HIP ARTHROPLASTY ANTERIOR APPROACH;  Surgeon: Shelda Pal, MD;  Location: [facility];  Service: Orthopedics;  Laterality: Right;  . TUBAL LIGATION      Family History  Problem Relation Age of Onset  . Anemia Mother   . Depression Other        both sides of family     Social History   Socioeconomic History  . Marital status: Married    Spouse name: Not on file  . Number of children: Not on file  . Years of education: Not on file  . Highest education level: Not on file  Occupational History  . Not on file  Tobacco Use  . Smoking status: Never Smoker  . Smokeless tobacco: Never Used  Substance and Sexual Activity  . Alcohol use: Yes    Comment: occassionally  . Drug use: No  . Sexual activity: Not on file  Other Topics Concern  . Not on file  Social History Narrative   Married; 2  children, 1 at home.    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   Social Determinants of Health   Financial Resource Strain:   . Difficulty of Paying Living Expenses:   Food Insecurity:   . Worried About Programme researcher, broadcasting/film/video in the Last Year:   . Barista in the Last Year:   Transportation Needs:   . Freight forwarder (Medical):   Marland Kitchen Lack of Transportation (Non-Medical):   Physical Activity:   . Days of Exercise per Week:   . Minutes of Exercise per Session:   Stress:   . Feeling of Stress :   Social Connections:   . Frequency of Communication with Friends and Family:   . Frequency of Social Gatherings with Friends and Family:   . Attends Religious Services:   . Active Member of Clubs or Organizations:   . Attends Banker Meetings:   Marland Kitchen Marital Status:   Intimate Partner Violence:   . Fear of Current or Ex-Partner:   . Emotionally Abused:   Marland Kitchen Physically Abused:   . Sexually Abused:     Outpatient Medications Prior to Visit  Medication Sig Dispense Refill  . Black Pepper-Turmeric (TURMERIC  COMPLEX/BLACK PEPPER) 3-500 MG CAPS Take by mouth.    . ferrous sulfate 325 (65 FE) MG tablet Take 325 mg by mouth daily with breakfast.    . predniSONE (DELTASONE) 5 MG tablet Take 5 mg by mouth daily.    . traMADol (ULTRAM) 50 MG tablet Take 1 tablet (50 mg total) by mouth every 6 (six) hours. 60 tablet 1  . hydrochlorothiazide (HYDRODIURIL) 25 MG tablet Take 1 tablet by mouth once daily 90 tablet 3  . meloxicam (MOBIC) 15 MG tablet Take by mouth.     No facility-administered medications prior to visit.    Allergies  Allergen Reactions  . Leflunomide Other (See Comments)    Rash  Incontinence   . Adalimumab Swelling  . Methotrexate Derivatives Other (See Comments)    Elevated Liver Functions and Bruising  . Simponi [Golimumab] Rash    ROS Review of Systems  Constitutional: Negative.   HENT: Negative.   Respiratory: Negative.     Cardiovascular: Negative.   Gastrointestinal: Negative.   Musculoskeletal: Positive for arthralgias and joint swelling.  Neurological: Negative.   Psychiatric/Behavioral: Negative.       Objective:    Physical Exam  Constitutional: She is oriented to person, place, and time. She appears well-developed and well-nourished. No distress.  HENT:  Head: Normocephalic and atraumatic.  Right Ear: External ear normal.  Left Ear: External ear normal.  Eyes: Conjunctivae are normal. Right eye exhibits no discharge. Left eye exhibits no discharge. No scleral icterus.  Neck: No JVD present. No tracheal deviation present.  Cardiovascular: Normal rate and regular rhythm.  Pulmonary/Chest: Effort normal and breath sounds normal. No stridor.  Musculoskeletal:     Right knee: Swelling and effusion present. Decreased range of motion. Tenderness present over the medial joint line and lateral joint line.     Left knee: Swelling and effusion present. Decreased range of motion. Tenderness present over the medial joint line and lateral joint line.  Neurological: She is alert and oriented to person, place, and time.  Skin: Skin is warm and dry. She is not diaphoretic.  Psychiatric: She has a normal mood and affect. Her behavior is normal.    BP (!) 158/100 (BP Location: Left Arm, Patient Position: Sitting, Cuff Size: Normal)   Pulse 84   Temp (!) 96.9 F (36.1 C) (Temporal)   Ht 5\' 1"  (1.549 m)   Wt 190 lb (86.2 kg)   SpO2 95%   BMI 35.90 kg/m  Wt Readings from Last 3 Encounters:  06/14/19 190 lb (86.2 kg)  05/10/19 194 lb (88 kg)  06/22/18 187 lb (84.8 kg)     Health Maintenance Due  Topic Date Due  . COVID-19 Vaccine (1) Never done  . TETANUS/TDAP  04/20/2014    There are no preventive care reminders to display for this patient.  Lab Results  Component Value Date   TSH 0.76 02/21/2018   Lab Results  Component Value Date   WBC 11.0 (H) 06/14/2019   HGB 10.1 (L) 06/14/2019   HCT  33.2 (L) 06/14/2019   MCV 71.2 (L) 06/14/2019   PLT 380.0 06/14/2019   Lab Results  Component Value Date   NA 137 06/14/2019   K 4.1 06/14/2019   CO2 28 06/14/2019   GLUCOSE 116 (H) 06/14/2019   BUN 25 (H) 06/14/2019   CREATININE 0.81 06/14/2019   BILITOT 0.3 02/21/2018   ALKPHOS 60 02/21/2018   AST 13 02/21/2018   ALT 13 02/21/2018   PROT 7.6 02/21/2018  ALBUMIN 4.0 02/21/2018   CALCIUM 9.5 06/14/2019   GFR 87.45 06/14/2019   Lab Results  Component Value Date   CHOL 286 (H) 09/01/2017   Lab Results  Component Value Date   HDL 117.90 09/01/2017   Lab Results  Component Value Date   LDLCALC 152 (H) 09/01/2017   Lab Results  Component Value Date   TRIG 80.0 09/01/2017   Lab Results  Component Value Date   CHOLHDL 2 09/01/2017   No results found for: HGBA1C    Assessment & Plan:   Problem List Items Addressed This Visit      Cardiovascular and Mediastinum   Essential hypertension - Primary   Relevant Medications   chlorthalidone (HYGROTON) 25 MG tablet   Other Relevant Orders   Basic metabolic panel (Completed)     Musculoskeletal and Integument   Rheumatoid arthritis involving multiple joints (HCC)   Relevant Medications   predniSONE (DELTASONE) 5 MG tablet   meloxicam (MOBIC) 15 MG tablet   Other Relevant Orders   Ambulatory referral to Rheumatology   Osteoarthritis of both knees   Relevant Medications   predniSONE (DELTASONE) 5 MG tablet   meloxicam (MOBIC) 15 MG tablet     Other   Anemia   Relevant Medications   Iron, Ferrous Gluconate, 256 (28 Fe) MG TABS   Other Relevant Orders   CBC (Completed)   Iron, TIBC and Ferritin Panel (Completed)   Iron deficiency   Relevant Medications   Iron, Ferrous Gluconate, 256 (28 Fe) MG TABS      Meds ordered this encounter  Medications  . chlorthalidone (HYGROTON) 25 MG tablet    Sig: Take 1 tablet (25 mg total) by mouth daily.    Dispense:  100 tablet    Refill:  1  . meloxicam (MOBIC) 15 MG  tablet    Sig: Take 1 tablet (15 mg total) by mouth daily.    Dispense:  30 tablet    Refill:  0  . Iron, Ferrous Gluconate, 256 (28 Fe) MG TABS    Sig: Take one tablet daily.    Dispense:  90 tablet    Refill:  1    Follow-up: Return in about 1 month (around 07/15/2019).   Have changed her blood pressure medication to chlorthalidone for better 24-hour coverage.  Discussed the importance of rheumatology follow-up for her rheumatoid arthritis. Mliss Sax, MD

## 2019-06-15 LAB — BASIC METABOLIC PANEL
BUN: 25 mg/dL — ABNORMAL HIGH (ref 6–23)
CO2: 28 mEq/L (ref 19–32)
Calcium: 9.5 mg/dL (ref 8.4–10.5)
Chloride: 101 mEq/L (ref 96–112)
Creatinine, Ser: 0.81 mg/dL (ref 0.40–1.20)
GFR: 87.45 mL/min (ref >60.00)
Glucose, Bld: 116 mg/dL — ABNORMAL HIGH (ref 70–99)
Potassium: 4.1 mEq/L (ref 3.5–5.1)
Sodium: 137 mEq/L (ref 135–145)

## 2019-06-15 LAB — CBC
HCT: 33.2 % — ABNORMAL LOW (ref 36.0–46.0)
Hemoglobin: 10.1 g/dL — ABNORMAL LOW (ref 12.0–15.0)
MCHC: 30.6 g/dL (ref 30.0–36.0)
MCV: 71.2 fl — ABNORMAL LOW (ref 78.0–100.0)
Platelets: 380 10*3/uL (ref 150.0–400.0)
RBC: 4.66 Mil/uL (ref 3.87–5.11)
RDW: 15.7 % — ABNORMAL HIGH (ref 11.5–15.5)
WBC: 11 10*3/uL — ABNORMAL HIGH (ref 4.0–10.5)

## 2019-06-15 LAB — IRON,TIBC AND FERRITIN PANEL
%SAT: 7 % (calc) — ABNORMAL LOW (ref 16–45)
Ferritin: 168 ng/mL (ref 16–232)
Iron: 19 ug/dL — ABNORMAL LOW (ref 45–160)
TIBC: 269 mcg/dL (calc) (ref 250–450)

## 2019-06-16 DIAGNOSIS — E611 Iron deficiency: Secondary | ICD-10-CM | POA: Insufficient documentation

## 2019-06-16 MED ORDER — IRON (FERROUS GLUCONATE) 256 (28 FE) MG PO TABS: ORAL_TABLET | ORAL | 1 refills | Status: DC

## 2019-06-16 NOTE — Addendum Note (Signed)
Addended by: Andrez Grime on: 06/16/2019 08:35 AM   Modules accepted: Orders

## 2019-07-12 ENCOUNTER — Other Ambulatory Visit: Payer: Self-pay

## 2019-07-13 ENCOUNTER — Encounter: Payer: Self-pay | Admitting: Nurse Practitioner

## 2019-07-13 ENCOUNTER — Ambulatory Visit: Payer: BC Managed Care – PPO | Admitting: Nurse Practitioner

## 2019-07-13 VITALS — BP 128/82 | HR 82 | Temp 96.7°F | Ht 61.0 in | Wt 188.2 lb

## 2019-07-13 DIAGNOSIS — M069 Rheumatoid arthritis, unspecified: Secondary | ICD-10-CM

## 2019-07-13 DIAGNOSIS — M170 Bilateral primary osteoarthritis of knee: Secondary | ICD-10-CM | POA: Diagnosis not present

## 2019-07-13 DIAGNOSIS — I10 Essential (primary) hypertension: Secondary | ICD-10-CM | POA: Diagnosis not present

## 2019-07-13 MED ORDER — MELOXICAM 15 MG PO TABS: 15.0000 mg | ORAL_TABLET | Freq: Every day | ORAL | 5 refills | Status: AC

## 2019-07-13 NOTE — Patient Instructions (Signed)
Go to lab for repeat BMP  Continue current medications  Sign medical release to get records from [facility]

## 2019-07-13 NOTE — Progress Notes (Signed)
Subjective:  [name], female    DOB: [birth year]  Age: 59 y.o. MRN: [id number]  CC: Establish Care (TOC from [facility] follow up-pt doesn't check BP at home but feels like it is stable-no tetanus today//no covid vaccine)  HPI HTN: Improve BP with chlorthalidone BP Readings from Last 3 Encounters:  07/13/19 128/82  06/14/19 (!) 158/100  05/10/19 140/90   RA: Improved joint pain with meloxicam She does not want to schedule appt with rheumatology at this time.  Reviewed past Medical, Social and Family history today.  Outpatient Medications Prior to Visit  Medication Sig Dispense Refill  . Black Pepper-Turmeric (TURMERIC COMPLEX/BLACK PEPPER) 3-500 MG CAPS Take by mouth.    . chlorthalidone (HYGROTON) 25 MG tablet Take 1 tablet (25 mg total) by mouth daily. 100 tablet 1  . ferrous sulfate 325 (65 FE) MG tablet Take 325 mg by mouth daily with breakfast.    . predniSONE (DELTASONE) 5 MG tablet Take 5 mg by mouth daily.    . traMADol (ULTRAM) 50 MG tablet Take 1 tablet (50 mg total) by mouth every 6 (six) hours. 60 tablet 1  . meloxicam (MOBIC) 15 MG tablet Take 1 tablet (15 mg total) by mouth daily. 30 tablet 0  . Iron, Ferrous Gluconate, 256 (28 Fe) MG TABS Take one tablet daily. (Patient not taking: Reported on 07/13/2019) 90 tablet 1   No facility-administered medications prior to visit.    ROS See HPI  Objective:  BP 128/82   Pulse 82   Temp (!) 96.7 F (35.9 C) (Tympanic)   Ht 5\' 1"  (1.549 m)   Wt 188 lb 3.2 oz (85.4 kg)   SpO2 97%   BMI 35.56 kg/m   BP Readings from Last 3 Encounters:  07/13/19 128/82  06/14/19 (!) 158/100  05/10/19 140/90    Wt Readings from Last 3 Encounters:  07/13/19 188 lb 3.2 oz (85.4 kg)  06/14/19 190 lb (86.2 kg)  05/10/19 194 lb (88 kg)   Physical Exam Cardiovascular:     Rate and Rhythm: Normal rate and regular rhythm.     Pulses: Normal pulses.     Heart sounds: Normal heart sounds.  Pulmonary:     Effort:  Pulmonary effort is normal.     Breath sounds: Normal breath sounds.  Neurological:     Mental Status: She is alert and oriented to person, place, and time.    Lab Results  Component Value Date   WBC 11.0 (H) 06/14/2019   HGB 10.1 (L) 06/14/2019   HCT 33.2 (L) 06/14/2019   PLT 380.0 06/14/2019   GLUCOSE 116 (H) 06/14/2019   CHOL 286 (H) 09/01/2017   TRIG 80.0 09/01/2017   HDL 117.90 09/01/2017   LDLDIRECT 142.9 12/02/2012   LDLCALC 152 (H) 09/01/2017   ALT 13 02/21/2018   AST 13 02/21/2018   NA 137 06/14/2019   K 4.1 06/14/2019   CL 101 06/14/2019   CREATININE 0.81 06/14/2019   BUN 25 (H) 06/14/2019   CO2 28 06/14/2019   TSH 0.76 02/21/2018   INR 1.00 12/14/2012    Assessment & Plan:  This visit occurred during the SARS-CoV-2 public health emergency.  Safety protocols were in place, including screening questions prior to the visit, additional usage of staff PPE, and extensive cleaning of exam room while observing appropriate contact time as indicated for disinfecting solutions.   Laine was seen today for establish care.  Diagnoses and all orders for this visit:  Essential  hypertension -     Basic metabolic panel  Rheumatoid arthritis involving multiple joints (HCC) -     meloxicam (MOBIC) 15 MG tablet; Take 1 tablet (15 mg total) by mouth daily. -     Basic metabolic panel  Osteoarthritis of both knees, unspecified osteoarthritis type -     meloxicam (MOBIC) 15 MG tablet; Take 1 tablet (15 mg total) by mouth daily. -     Basic metabolic panel   I am having Zenya B. Glantz maintain her Black Pepper-Turmeric, ferrous sulfate, traMADol, predniSONE, chlorthalidone, Iron (Ferrous Gluconate), and meloxicam.  Meds ordered this encounter  Medications  . meloxicam (MOBIC) 15 MG tablet    Sig: Take 1 tablet (15 mg total) by mouth daily.    Dispense:  30 tablet    Refill:  5    Order Specific Question:   Supervising Provider    Answer:   Overton Mam [[id number]]      Problem List Items Addressed This Visit      Cardiovascular and Mediastinum   Essential hypertension - Primary   Relevant Orders   Basic metabolic panel     Musculoskeletal and Integument   Osteoarthritis of both knees   Relevant Medications   meloxicam (MOBIC) 15 MG tablet   Other Relevant Orders   Basic metabolic panel   Rheumatoid arthritis involving multiple joints (HCC)   Relevant Medications   meloxicam (MOBIC) 15 MG tablet   Other Relevant Orders   Basic metabolic panel       Follow-up: Return in about 6 months (around 01/12/2020) for HTN.  [name], NP

## 2019-07-14 LAB — BASIC METABOLIC PANEL
BUN: 21 mg/dL (ref 6–23)
CO2: 29 mEq/L (ref 19–32)
Calcium: 9.5 mg/dL (ref 8.4–10.5)
Chloride: 100 mEq/L (ref 96–112)
Creatinine, Ser: 0.89 mg/dL (ref 0.40–1.20)
GFR: 78.42 mL/min (ref >60.00)
Glucose, Bld: 69 mg/dL — ABNORMAL LOW (ref 70–99)
Potassium: 3.8 mEq/L (ref 3.5–5.1)
Sodium: 140 mEq/L (ref 135–145)

## 2019-07-23 ENCOUNTER — Other Ambulatory Visit: Payer: Self-pay | Admitting: Family Medicine

## 2019-07-25 NOTE — Telephone Encounter (Signed)
Last office visit 07/13/2019 for HTN.  Last refilled 05/30/2019 for #60 with 1 refill by Dr. Patsy Lager.  Next Appt with PCP on 01/15/2020 for 6 month follow up on HTN.  Ok to refill?

## 2019-08-04 ENCOUNTER — Other Ambulatory Visit: Payer: Self-pay

## 2019-08-06 NOTE — Progress Notes (Signed)
Erin Tomes T. Chue Berkovich, MD, CAQ Sports Medicine  Primary Care and Sports Medicine [facility] [address] [place], [address]  Phone: [phone]  FAX: [phone]  Erin Bass - 59 y.o. female  MRN [id number]  Date of Birth: Feb 06, 1960  Date: 08/07/2019  PCP: Anne Ng, NP  Referral: Anne Ng, NP  Chief Complaint  Patient presents with  . Knee Pain    Bilateral Knee Injections    This visit occurred during the SARS-CoV-2 public health emergency.  Safety protocols were in place, including screening questions prior to the visit, additional usage of staff PPE, and extensive cleaning of exam room while observing appropriate contact time as indicated for disinfecting solutions.   Subjective:   Erin Bass is a 59 y.o. very pleasant female patient with Body mass index is 35.05 kg/m. who presents with the following:  She is a pleasant young lady at age 43 and she is troubled with rheumatoid arthritis.  She does not do any DMARD therapy.  She is not followed by rheumatology.  Has a Rheumatologist?   She has not been on any DMARD for quite a long time.  She reports that she has seen approximately 5 different rheumatologist over the years, and she has been on various agents and not able to tolerate them including methotrexate and other Biologics.  She asked about ibuprofen as a possible option, but she is already on meloxicam at a maximum dose.  She also asked about pain medication, and she is already on tramadol 50 mg.  She is taking this approximately twice daily.  She does have bilateral knee pain that affect her daily and impaired her function.  Review of Systems is noted in the HPI, as appropriate  Objective:   BP (!) 150/100   Pulse 94   Temp 98.8 F (37.1 C) (Temporal)   Ht 5\' 1"  (1.549 m)   Wt 185 lb 8 oz (84.1 kg)   SpO2 99%   BMI 35.05 kg/m   GEN: No acute distress; alert,appropriate. PULM:  Breathing comfortably in no respiratory distress PSYCH: Normally interactive.   Bilateral knees: Mild effusion.  Stable to varus and valgus stress.  ACL and PCL are intact but she does have significant medial and lateral joint line tenderness.  She does have pain with flexion at the knees as well as complete extension.  McMurray's causes pain.  Laboratory and Imaging Data:  Assessment and Plan:     ICD-10-CM   1. Rheumatoid arthritis involving both knees, unspecified whether rheumatoid factor present (HCC)  M06.9 DG Knee 4 Views W/Patella Left    DG Knee 4 Views W/Patella Right    Ambulatory referral to Rheumatology  2. Chronic pain of both knees  M25.561 DG Knee 4 Views W/Patella Left   M25.562 DG Knee 4 Views W/Patella Right   G89.29 Ambulatory referral to Rheumatology   Total encounter time: 30 minutes. On the day of the patient encounter, this can include review of prior records, labs, and imaging.  Additional time can include counselling, consultation with peer MD in person or by telephone.  This also includes independent review of Radiology.  Chart review.  Discussion regarding anti-inflammatory use.  Discussion regarding of appropriate time for joint replacement.  Discussion about rheumatological agents and DMARD.  Review and recommendations for NSAIDs as well as other pain management.  Recommended strongly that she consider seeing rheumatology again in consider going back on DMARD.  New  x-rays today, and we are going to do 2 intra-articular injections as well.  Aspiration/Injection Procedure Note Erin Bass May 03, 1960 Date of procedure: 08/07/2019  Procedure: Large Joint Joint Aspiration / Injection of the Right Knee Indications: Pain  Procedure Details Patient verbally consented to procedure. Risks (including potential rare risk of infection), benefits, and alternatives explained. Sterilely prepped with Chloraprep. Ethyl cholride used for anesthesia. 8 cc Lidocaine 1% mixed  with 2 mL Depo-Medrol 40 mg injected using the anteromedial approach without difficulty. No complications with procedure and tolerated well. Patient had decreased pain post-injection.  Medication: 2 mL of Depo-Medrol 40 mg, equaling Depo-Medrol 80 mg total  Aspiration/Injection Procedure Note Erin DIPRIMA 1960/10/28 Date of procedure: 08/07/2019  Procedure: Large Joint Aspiration / Injection of the Left Knee Indications: Pain  Procedure Details Patient verbally consented to procedure. Risks (including potential rare risk of infection), benefits, and alternatives explained. Sterilely prepped with Chloraprep. Ethyl cholride used for anesthesia. 8 cc Lidocaine 1% mixed with 2 mL Depo-Medrol 40 mg injected using the anteromedial approach without difficulty. No complications with procedure and tolerated well. Patient had decreased pain post-injection.  Medication: 2 mL of Depo-Medrol 40 mg, equaling Depo-Medrol 80 mg total  Follow-up: No follow-ups on file.  No orders of the defined types were placed in this encounter.  Medications Discontinued During This Encounter  Medication Reason  . Iron, Ferrous Gluconate, 256 (28 Fe) MG TABS Duplicate   Orders Placed This Encounter  Procedures  . DG Knee 4 Views W/Patella Left  . DG Knee 4 Views W/Patella Right  . Ambulatory referral to Rheumatology    Signed,  Karleen Hampshire T. Earla Charlie, MD   Outpatient Encounter Medications as of 08/07/2019  Medication Sig  . Black Pepper-Turmeric (TURMERIC COMPLEX/BLACK PEPPER) 3-500 MG CAPS Take by mouth.  . chlorthalidone (HYGROTON) 25 MG tablet Take 1 tablet (25 mg total) by mouth daily.  . ferrous sulfate 325 (65 FE) MG tablet Take 325 mg by mouth daily with breakfast.  . meloxicam (MOBIC) 15 MG tablet Take 1 tablet (15 mg total) by mouth daily.  . traMADol (ULTRAM) 50 MG tablet Take 1 tablet (50 mg total) by mouth every 12 (twelve) hours as needed. Need appointment for additional refills .  . predniSONE  (DELTASONE) 5 MG tablet Take 5 mg by mouth daily. (Patient not taking: Reported on 08/07/2019)  . [DISCONTINUED] Iron, Ferrous Gluconate, 256 (28 Fe) MG TABS Take one tablet daily. (Patient not taking: Reported on 07/13/2019)   No facility-administered encounter medications on file as of 08/07/2019.

## 2019-08-07 ENCOUNTER — Encounter: Payer: Self-pay | Admitting: Family Medicine

## 2019-08-07 ENCOUNTER — Ambulatory Visit (INDEPENDENT_AMBULATORY_CARE_PROVIDER_SITE_OTHER)
Admission: RE | Admit: 2019-08-07 | Discharge: 2019-08-07 | Disposition: A | Discharge status: Home / Self Care | Payer: BC Managed Care – PPO | Source: Ambulatory Visit | Attending: Family Medicine | Admitting: Family Medicine

## 2019-08-07 ENCOUNTER — Ambulatory Visit: Payer: BC Managed Care – PPO | Admitting: Family Medicine

## 2019-08-07 ENCOUNTER — Other Ambulatory Visit: Payer: Self-pay

## 2019-08-07 ENCOUNTER — Ambulatory Visit
Admission: RE | Admit: 2019-08-07 | Discharge: 2019-08-07 | Disposition: A | Discharge status: Home / Self Care | Payer: BC Managed Care – PPO | Source: Ambulatory Visit | Attending: Family Medicine | Admitting: Family Medicine

## 2019-08-07 VITALS — BP 150/100 | HR 94 | Temp 98.8°F | Ht 61.0 in | Wt 185.5 lb

## 2019-08-07 DIAGNOSIS — M25562 Pain in left knee: Secondary | ICD-10-CM | POA: Diagnosis not present

## 2019-08-07 DIAGNOSIS — M1712 Unilateral primary osteoarthritis, left knee: Secondary | ICD-10-CM | POA: Diagnosis not present

## 2019-08-07 DIAGNOSIS — M25561 Pain in right knee: Secondary | ICD-10-CM | POA: Diagnosis not present

## 2019-08-07 DIAGNOSIS — M069 Rheumatoid arthritis, unspecified: Secondary | ICD-10-CM

## 2019-08-07 DIAGNOSIS — G8929 Other chronic pain: Secondary | ICD-10-CM

## 2019-08-07 DIAGNOSIS — M25462 Effusion, left knee: Secondary | ICD-10-CM | POA: Diagnosis not present

## 2019-08-07 DIAGNOSIS — M1711 Unilateral primary osteoarthritis, right knee: Secondary | ICD-10-CM | POA: Diagnosis not present

## 2019-08-07 DIAGNOSIS — M25461 Effusion, right knee: Secondary | ICD-10-CM | POA: Diagnosis not present

## 2019-08-07 MED ORDER — METHYLPREDNISOLONE ACETATE 40 MG/ML IJ SUSP
80.0000 mg | Freq: Once | INTRAMUSCULAR | Status: AC
  Administered 2019-08-07: 80 mg via INTRA_ARTICULAR

## 2019-08-07 NOTE — Addendum Note (Signed)
Addended by: Damita Lack on: 08/07/2019 09:56 AM   Modules accepted: Orders

## 2019-08-28 ENCOUNTER — Encounter: Payer: Self-pay | Admitting: Nurse Practitioner

## 2019-08-28 LAB — BRAIN NATRIURETIC PEPTIDE: B Natriuretic Peptide: 43.52

## 2019-08-28 LAB — T4, FREE: Free T4: 1.11

## 2019-08-28 NOTE — Progress Notes (Signed)
Abstracted result and sent to scan  

## 2019-08-30 DIAGNOSIS — M0579 Rheumatoid arthritis with rheumatoid factor of multiple sites without organ or systems involvement: Secondary | ICD-10-CM | POA: Diagnosis not present

## 2019-08-30 DIAGNOSIS — M150 Primary generalized (osteo)arthritis: Secondary | ICD-10-CM | POA: Diagnosis not present

## 2019-08-30 DIAGNOSIS — E669 Obesity, unspecified: Secondary | ICD-10-CM | POA: Diagnosis not present

## 2019-08-30 DIAGNOSIS — Z6833 Body mass index (BMI) 33.0-33.9, adult: Secondary | ICD-10-CM | POA: Diagnosis not present

## 2019-09-11 ENCOUNTER — Encounter: Payer: Self-pay | Admitting: Nurse Practitioner

## 2019-09-11 LAB — SARS-COV-2 ANTIBODIES

## 2019-09-11 NOTE — Progress Notes (Signed)
Abstracted result and sent to scan  

## 2019-10-11 DIAGNOSIS — M0579 Rheumatoid arthritis with rheumatoid factor of multiple sites without organ or systems involvement: Secondary | ICD-10-CM | POA: Diagnosis not present

## 2019-10-11 DIAGNOSIS — M150 Primary generalized (osteo)arthritis: Secondary | ICD-10-CM | POA: Diagnosis not present

## 2019-11-14 ENCOUNTER — Other Ambulatory Visit: Payer: Self-pay

## 2019-11-15 ENCOUNTER — Encounter: Payer: Self-pay | Admitting: Nurse Practitioner

## 2019-11-15 ENCOUNTER — Ambulatory Visit: Payer: BC Managed Care – PPO | Admitting: Nurse Practitioner

## 2019-11-15 VITALS — BP 140/90 | HR 70 | Temp 97.0°F | Ht 61.0 in | Wt 190.4 lb

## 2019-11-15 DIAGNOSIS — N3000 Acute cystitis without hematuria: Secondary | ICD-10-CM | POA: Diagnosis not present

## 2019-11-15 DIAGNOSIS — R300 Dysuria: Secondary | ICD-10-CM

## 2019-11-15 LAB — POCT URINALYSIS DIPSTICK
Blood, UA: NEGATIVE
Glucose, UA: NEGATIVE
Ketones, UA: NEGATIVE
Nitrite, UA: NEGATIVE
Protein, UA: POSITIVE — AB
Spec Grav, UA: 1.025 (ref 1.010–1.025)
Urobilinogen, UA: 0.2 E.U./dL
pH, UA: 6 (ref 5.0–8.0)

## 2019-11-15 MED ORDER — PHENAZOPYRIDINE HCL 100 MG PO TABS: 100.0000 mg | ORAL_TABLET | Freq: Three times a day (TID) | ORAL | 0 refills | Status: DC | PRN

## 2019-11-15 NOTE — Progress Notes (Addendum)
Subjective:  [name], female    DOB: 09/05/1960  Age: 59 y.o. MRN: [id number]  CC: Acute Visit (Pt c/o burning when urinating, foul odor and urinary frequency x2 weeks. )  Dysuria  This is a new problem. The current episode started 1 to 4 weeks ago. The problem occurs every urination. The problem has been unchanged. The quality of the pain is described as burning. There has been no fever. She is not sexually active. There is no history of pyelonephritis. Associated symptoms include frequency and urgency. Pertinent negatives include no chills, discharge, flank pain, hematuria, hesitancy, nausea, possible pregnancy, sweats or vomiting. She has tried nothing for the symptoms. There is no history of kidney stones, recurrent UTIs or urinary stasis.  denies any vaginal discharge or rash Denies any constipation or diarrhea  Reviewed past Medical, Social and Family history today.  Outpatient Medications Prior to Visit  Medication Sig Dispense Refill   Black Pepper-Turmeric (TURMERIC COMPLEX/BLACK PEPPER) 3-500 MG CAPS Take by mouth.     chlorthalidone (HYGROTON) 25 MG tablet Take 1 tablet (25 mg total) by mouth daily. 100 tablet 1   DULoxetine (CYMBALTA) 60 MG capsule Take 60 mg by mouth daily.     ferrous sulfate 325 (65 FE) MG tablet Take 325 mg by mouth daily with breakfast.     meloxicam (MOBIC) 15 MG tablet Take 15 mg by mouth daily.     RINVOQ 15 MG TB24 Take 1 tablet by mouth daily.     traMADol (ULTRAM) 50 MG tablet Take 1 tablet (50 mg total) by mouth every 12 (twelve) hours as needed. Need appointment for additional refills . 60 tablet 0   predniSONE (DELTASONE) 5 MG tablet Take 5 mg by mouth daily. (Patient not taking: Reported on 08/07/2019)     No facility-administered medications prior to visit.    ROS See HPI  Objective:  BP 140/90 (BP Location: Left Arm, Patient Position: Sitting, Cuff Size: Large)    Pulse 70    Temp (!) 97 F (36.1 C) (Temporal)     Ht 5\' 1"  (1.549 m)    Wt 190 lb 6.4 oz (86.4 kg)    SpO2 98%    BMI 35.98 kg/m   Physical Exam Vitals reviewed.  Cardiovascular:     Rate and Rhythm: Normal rate.     Pulses: Normal pulses.  Abdominal:     General: Bowel sounds are normal. There is no distension.     Palpations: Abdomen is soft.     Tenderness: There is no abdominal tenderness. There is no right CVA tenderness, left CVA tenderness or guarding.  Neurological:     Mental Status: She is alert.    Assessment & Plan:  This visit occurred during the SARS-CoV-2 public health emergency.  Safety protocols were in place, including screening questions prior to the visit, additional usage of staff PPE, and extensive cleaning of exam room while observing appropriate contact time as indicated for disinfecting solutions.   Erin Bass was seen today for acute visit.  Diagnoses and all orders for this visit:  Acute cystitis without hematuria -     POCT Urinalysis Dipstick -     Urine Culture -     phenazopyridine (PYRIDIUM) 100 MG tablet; Take 1 tablet (100 mg total) by mouth 3 (three) times daily as needed for pain (with food). -     nitrofurantoin, macrocrystal-monohydrate, (MACROBID) 100 MG capsule; Take 1 capsule (100 mg total) by mouth 2 (two) times  daily for 7 days.   Problem List Items Addressed This Visit    None    Visit Diagnoses    Acute cystitis without hematuria    -  Primary   Relevant Medications   phenazopyridine (PYRIDIUM) 100 MG tablet   nitrofurantoin, macrocrystal-monohydrate, (MACROBID) 100 MG capsule   Other Relevant Orders   POCT Urinalysis Dipstick (Completed)   Urine Culture (Completed)      Follow-up: Return if symptoms worsen or fail to improve.  [name], NP

## 2019-11-15 NOTE — Patient Instructions (Signed)
Your urine sent for culture. You will be call with results. Use pyridium for dysuria. Maintain adequate oral hydration.

## 2019-11-17 LAB — URINE CULTURE
MICRO NUMBER:: 11125777
SPECIMEN QUALITY:: ADEQUATE

## 2019-11-21 MED ORDER — NITROFURANTOIN MONOHYD MACRO 100 MG PO CAPS: 100.0000 mg | ORAL_CAPSULE | Freq: Two times a day (BID) | ORAL | 0 refills | Status: AC

## 2019-11-21 NOTE — Addendum Note (Signed)
Addended by: [name] L on: 11/21/2019 12:45 PM   Modules accepted: Orders

## 2019-11-22 ENCOUNTER — Telehealth: Payer: Self-pay | Admitting: Nurse Practitioner

## 2019-11-22 NOTE — Telephone Encounter (Signed)
Patient is returning the call, please advise. CB is [phone]

## 2019-11-23 NOTE — Telephone Encounter (Signed)
Results already given to patient.

## 2019-11-24 ENCOUNTER — Other Ambulatory Visit: Payer: Self-pay | Admitting: Family Medicine

## 2019-11-24 DIAGNOSIS — I10 Essential (primary) hypertension: Secondary | ICD-10-CM

## 2019-12-18 DIAGNOSIS — M25561 Pain in right knee: Secondary | ICD-10-CM | POA: Diagnosis not present

## 2019-12-18 DIAGNOSIS — M0579 Rheumatoid arthritis with rheumatoid factor of multiple sites without organ or systems involvement: Secondary | ICD-10-CM | POA: Diagnosis not present

## 2019-12-18 DIAGNOSIS — M25562 Pain in left knee: Secondary | ICD-10-CM | POA: Diagnosis not present

## 2019-12-18 DIAGNOSIS — M150 Primary generalized (osteo)arthritis: Secondary | ICD-10-CM | POA: Diagnosis not present

## 2020-01-03 ENCOUNTER — Other Ambulatory Visit: Payer: Self-pay | Admitting: Family Medicine

## 2020-01-03 NOTE — Telephone Encounter (Signed)
Refill request for:  Erin Bass 15 mg LR 11/12/19  (historical provider) LOV 11/15/19 & 07/13/19 ([name]) FOV none scheduled.    Please review and advise.   Thanks. Dm/cma

## 2020-01-15 ENCOUNTER — Ambulatory Visit: Payer: BC Managed Care – PPO | Admitting: Nurse Practitioner

## 2020-01-29 ENCOUNTER — Other Ambulatory Visit: Payer: Self-pay

## 2020-01-30 ENCOUNTER — Encounter: Payer: Self-pay | Admitting: Nurse Practitioner

## 2020-01-30 ENCOUNTER — Ambulatory Visit: Payer: BC Managed Care – PPO | Admitting: Nurse Practitioner

## 2020-01-30 VITALS — BP 130/86 | HR 86 | Temp 97.8°F | Ht 61.0 in | Wt 192.8 lb

## 2020-01-30 DIAGNOSIS — M62838 Other muscle spasm: Secondary | ICD-10-CM

## 2020-01-30 DIAGNOSIS — M5030 Other cervical disc degeneration, unspecified cervical region: Secondary | ICD-10-CM

## 2020-01-30 MED ORDER — METHOCARBAMOL 500 MG PO TABS
500.0000 mg | ORAL_TABLET | Freq: Three times a day (TID) | ORAL | 0 refills | Status: DC | PRN
Start: 2020-01-30 — End: 2020-04-03

## 2020-01-30 NOTE — Patient Instructions (Signed)
Continue mobic Start robaxin (can cause drowsiness, so do not take and rive) You will be contacted to schedule an appt for PT Alternated between warm and cold compress as needed

## 2020-01-30 NOTE — Progress Notes (Signed)
Subjective:  [name], female    DOB: Sep 02, 1960  Age: 60 y.o. MRN: [id number]  CC: Acute Visit (Pt c/o neck pain and spasms x 5 weeks. Pt states she has been taking the prednisone 5 mg prescribed but it is not helping. )  Neck Pain  This is a chronic problem. The current episode started more than 1 year ago. The problem occurs intermittently. The problem has been waxing and waning. The pain is associated with nothing. The pain is present in the right side. The quality of the pain is described as aching and cramping. The symptoms are aggravated by twisting. The pain is same all the time. Pertinent negatives include no numbness, paresis, tingling, trouble swallowing or weakness. She has tried NSAIDs (and oral prednisone) for the symptoms. The treatment provided mild relief.  hx of rheumatoid arthritis  Reviewed past Medical, Social and Family history today.  Outpatient Medications Prior to Visit  Medication Sig Dispense Refill  . Black Pepper-Turmeric 3-500 MG CAPS Take by mouth.    . chlorthalidone (HYGROTON) 25 MG tablet TAKE 1 TABLET EVERY DAY 90 tablet 1  . DULoxetine (CYMBALTA) 60 MG capsule Take 60 mg by mouth daily.    . ferrous sulfate 325 (65 FE) MG tablet Take 325 mg by mouth daily with breakfast.    . meloxicam (MOBIC) 15 MG tablet TAKE 1 TABLET EVERY DAY 30 tablet 3  . prednisoLONE 5 MG TABS tablet Take by mouth.    Marland Kitchen RINVOQ 15 MG TB24 Take 1 tablet by mouth daily.    . phenazopyridine (PYRIDIUM) 100 MG tablet Take 1 tablet (100 mg total) by mouth 3 (three) times daily as needed for pain (with food). (Patient not taking: Reported on 01/30/2020) 10 tablet 0  . traMADol (ULTRAM) 50 MG tablet Take 1 tablet (50 mg total) by mouth every 12 (twelve) hours as needed. Need appointment for additional refills . (Patient not taking: Reported on 01/30/2020) 60 tablet 0   No facility-administered medications prior to visit.    ROS See HPI  Objective:  BP 130/86 (BP  Location: Left Arm, Patient Position: Sitting, Cuff Size: Large)   Pulse 86   Temp 97.8 F (36.6 C) (Temporal)   Ht 5\' 1"  (1.549 m)   Wt 192 lb 12.8 oz (87.5 kg)   SpO2 98%   BMI 36.43 kg/m   Physical Exam Neck:     Thyroid: No thyroid mass, thyromegaly or thyroid tenderness.     Trachea: Trachea normal.  Musculoskeletal:     Cervical back: Tenderness present. No erythema or torticollis. Pain with movement and muscular tenderness present. No spinous process tenderness. Decreased range of motion.  Lymphadenopathy:     Cervical: No cervical adenopathy.  Neurological:     Mental Status: She is alert.    Assessment & Plan:  This visit occurred during the SARS-CoV-2 public health emergency.  Safety protocols were in place, including screening questions prior to the visit, additional usage of staff PPE, and extensive cleaning of exam room while observing appropriate contact time as indicated for disinfecting solutions.   Erin Bass was seen today for acute visit.  Diagnoses and all orders for this visit:  Cervical paraspinal muscle spasm -     Ambulatory referral to Physical Therapy -     methocarbamol (ROBAXIN) 500 MG tablet; Take 1 tablet (500 mg total) by mouth every 8 (eight) hours as needed for muscle spasms.  DDD (degenerative disc disease), cervical -  Ambulatory referral to Physical Therapy -     methocarbamol (ROBAXIN) 500 MG tablet; Take 1 tablet (500 mg total) by mouth every 8 (eight) hours as needed for muscle spasms.  Continue mobic Start robaxin (can cause drowsiness, so do not take and rive) You will be contacted to schedule an appt for PT Alternated between warm and cold compress as needed  Problem List Items Addressed This Visit   None   Visit Diagnoses    Cervical paraspinal muscle spasm    -  Primary   Relevant Medications   methocarbamol (ROBAXIN) 500 MG tablet   Other Relevant Orders   Ambulatory referral to Physical Therapy   DDD (degenerative disc  disease), cervical       Relevant Medications   prednisoLONE 5 MG TABS tablet   methocarbamol (ROBAXIN) 500 MG tablet   Other Relevant Orders   Ambulatory referral to Physical Therapy      Follow-up: No follow-ups on file.  Erin Penna, NP

## 2020-01-31 ENCOUNTER — Ambulatory Visit: Payer: BC Managed Care – PPO | Admitting: Nurse Practitioner

## 2020-02-15 ENCOUNTER — Ambulatory Visit: Payer: BC Managed Care – PPO | Attending: Nurse Practitioner | Admitting: Physical Therapy

## 2020-02-15 ENCOUNTER — Encounter: Payer: Self-pay | Admitting: Physical Therapy

## 2020-02-15 ENCOUNTER — Other Ambulatory Visit: Payer: Self-pay

## 2020-02-15 DIAGNOSIS — M6281 Muscle weakness (generalized): Secondary | ICD-10-CM | POA: Insufficient documentation

## 2020-02-15 DIAGNOSIS — R293 Abnormal posture: Secondary | ICD-10-CM | POA: Diagnosis not present

## 2020-02-15 DIAGNOSIS — M542 Cervicalgia: Secondary | ICD-10-CM | POA: Diagnosis not present

## 2020-02-15 NOTE — Therapy (Signed)
[facility] Outpatient Rehabilitation [facility] [address]  [address] [place], [place], [address] Phone: [phone]   Fax:  [phone]  Physical Therapy Evaluation  Patient Details  Name: Erin Bass MRN: [id number] Date of Birth: November 08, 1960 Referring Provider (PT): Anne Ng, NP   Encounter Date: 02/15/2020   PT End of Session - 02/15/20 0907    Visit Number 1    Number of Visits 7    Date for PT Re-Evaluation 03/28/20    Authorization Type BCBS    PT Start Time 0801    PT Stop Time 0840    PT Time Calculation (min) 39 min    Activity Tolerance Patient tolerated treatment well;Patient limited by pain    Behavior During Therapy [facility] for tasks assessed/performed           Past Medical History:  Diagnosis Date  . Anemia   . Arthritis   . HTN (hypertension)     Past Surgical History:  Procedure Laterality Date  . ABDOMINAL HYSTERECTOMY     Partial- Uterus only  . bone spur - removal    . CESAREAN SECTION     x2  . PARTIAL HYSTERECTOMY  1/06   fibroids; ovaries left in   . TOTAL HIP ARTHROPLASTY Right 12/20/2012   Procedure: RIGHT TOTAL HIP ARTHROPLASTY ANTERIOR APPROACH;  Surgeon: Shelda Pal, MD;  Location: [facility];  Service: Orthopedics;  Laterality: Right;  . TUBAL LIGATION      There were no vitals filed for this visit.    Subjective Assessment - 02/15/20 0803    Subjective Patient reports neck pain for the past 2 months. Reports that she has RA and not sure if it is d/t the medication she has taken for her RA or the disease process itself. Pain is located over the R posterior mid neck and shoots to the back of the ear. Denies HAs. Pain worse if she sits at the computer too much, with cold weather, and notices popping when turning to the L. Also reports difficulty sleeping and putting on socks d/t neck pain. Better with Biofreeze. Denies radiation or N/T.    Pertinent History HTN, anemia, R THA, RA    Limitations Lifting;House  hold activities;Sitting;Reading    Diagnostic tests none    Patient Stated Goals decrease pain    Currently in Pain? Yes    Pain Score 5     Pain Location Neck    Pain Orientation Left    Pain Descriptors / Indicators Aching    Pain Type Acute pain              [facility] PT Assessment - 02/15/20 0807      Assessment   Medical Diagnosis Cervical paraspinal muscle spasm, cervical DDD    Referring Provider (PT) Anne Ng, NP    Onset Date/Surgical Date 12/16/19    Hand Dominance Right    Next MD Visit not scheduled    Prior Therapy no      Precautions   Precautions None      Balance Screen   Has the patient fallen in the past 6 months Yes    How many times? 2   fell out of a chair and sustained bruising   Has the patient had a decrease in activity level because of a fear of falling?  No    Is the patient reluctant to leave their home because of a fear of falling?  No  Home Environment   Living Environment Private residence   townhome   Living Arrangements Spouse/significant other    Available Help at Discharge Family    Type of Home House      Prior Function   Level of Independence Independent    Vocation On disability    Leisure none      Cognition   Overall Cognitive Status Within Functional Limits for tasks assessed      Observation/Other Assessments   Focus on Therapeutic Outcomes (FOTO)  cervical: 43 (59 predicted)      Sensation   Light Touch Appears Intact      Coordination   Gross Motor Movements are Fluid and Coordinated Yes      Posture/Postural Control   Posture/Postural Control Postural limitations    Postural Limitations Forward head;Rounded Shoulders    Posture Comments excessive cervical lordosis      ROM / Strength   AROM / PROM / Strength AROM;Strength      AROM   AROM Assessment Site Cervical    Cervical Flexion 50   severe pain   Cervical Extension 47   severe pain   Cervical - Right Side Bend 45   c/o popping; moderate  pain   Cervical - Left Side Bend 32   moderate pain   Cervical - Right Rotation 65   severe pain   Cervical - Left Rotation 44   mild pain     Strength   Strength Assessment Site Shoulder;Wrist;Hand;Elbow    Right/Left Shoulder Right;Left    Right Shoulder Flexion 4+/5    Right Shoulder ABduction 4+/5    Right Shoulder Internal Rotation 4/5    Right Shoulder External Rotation 4-/5    Left Shoulder Flexion 4/5    Left Shoulder ABduction 4+/5    Left Shoulder Internal Rotation 4/5    Left Shoulder External Rotation 4/5    Right/Left Elbow Right;Left    Right Elbow Flexion 4/5    Right Elbow Extension 4/5    Left Elbow Flexion 4/5    Left Elbow Extension 4/5    Right/Left Wrist Right;Left    Right Wrist Flexion 4-/5   pain at baseline   Right Wrist Extension 4-/5   pain at baseline   Left Wrist Flexion 4-/5   pain at baseline   Left Wrist Extension 4-/5   pain at baseline   Right/Left hand Right;Left    Right Hand Grip (lbs) 2.33   2, 2, 3   Left Hand Grip (lbs) 8.33   10, 10, 5     Palpation   Palpation comment TTP over R LS, rhomboids, cervical paraspinals and R mastoid process                      Objective measurements completed on examination: See above findings.               PT Education - 02/15/20 0907    Education Details prognosis, POC, HEP- advised not to push into pain    Person(s) Educated Patient    Methods Explanation;Demonstration;Tactile cues;Verbal cues;Handout    Comprehension Returned demonstration;Verbalized understanding            PT Short Term Goals - 02/15/20 0913      PT SHORT TERM GOAL #1   Title Patient to be independent with initial HEP.    Time 2    Period Weeks    Status New    Target Date 02/29/20  PT Long Term Goals - 02/15/20 0913      PT LONG TERM GOAL #1   Title Patient to be independent with advanced HEP.    Time 6    Period Weeks    Status New    Target Date 03/28/20      PT  LONG TERM GOAL #2   Title Patient to demonstrate B UE strength >/=4+/5 and grip strength atleast 20lbs.    Time 6    Period Weeks    Status New    Target Date 03/28/20      PT LONG TERM GOAL #3   Title Patient to demonstrate cervical AROM WFL and with only mild pain remaining.    Time 6    Period Weeks    Status New    Target Date 03/28/20      PT LONG TERM GOAL #4   Title Patient to report 75% improvement in sleeping tolerance d/t neck pain.    Time 6    Period Weeks    Status New    Target Date 03/28/20      PT LONG TERM GOAL #5   Title Patient to report tolerance for donning socks without pain limiting.    Time 6    Period Weeks    Status New    Target Date 03/28/20                  Plan - 02/15/20 0909    Clinical Impression Statement Patient is a 60 y/o F presenting to OPPT with c/o insidious neck pain for the past 2 months. Pain occurs over the R posterior mid c-spine which shoots to the mastoid process. Denies radiation or N/T. Worse with prolonged computer work, cold weather, turning to the L, and putting on socks. Patient also reports difficulty sleeping as a result of pain. Patient today presenting with excessive cervical lordosis and rounded shoulders, limited and painful cervical AROM, decreased B UE and grip strength, and TTP over R LS, rhomboids, cervical paraspinals and R mastoid process. Patient was educated on gentle stretching and postural correctrion HEP- patient reported understanding. Would benefit from skilled PT services 1x/week for 6 weeks to address aforementioned impairments.    Personal Factors and Comorbidities Age;Comorbidity 3+;Fitness;Past/Current Experience;Time since onset of injury/illness/exacerbation    Comorbidities HTN, anemia, R THA, RA    Examination-Activity Limitations Bed Mobility;Sleep;Sit;Bend;Carry;Dressing;Hygiene/Grooming;Lift;Reach Overhead;Transfers    Examination-Participation Restrictions Church;Cleaning;Community  Activity;Driving;Laundry;Meal Prep;Shop    Stability/Clinical Decision Making Stable/Uncomplicated    Clinical Decision Making Low    Rehab Potential Good    PT Frequency 1x / week    PT Duration 6 weeks    PT Treatment/Interventions ADLs/Self Care Home Management;Cryotherapy;Electrical Stimulation;Moist Heat;Traction;Therapeutic exercise;Therapeutic activities;Functional mobility training;Ultrasound;Neuromuscular re-education;Patient/family education;Manual techniques;Taping;Energy conservation;Dry needling;Passive range of motion    PT Next Visit Plan reassess HEP; progress cervical AROM and UE strengthening    Consulted and Agree with Plan of Care Patient           Patient will benefit from skilled therapeutic intervention in order to improve the following deficits and impairments:  Hypomobility,Decreased activity tolerance,Decreased strength,Increased fascial restricitons,Impaired UE functional use,Pain,Increased muscle spasms,Improper body mechanics,Decreased range of motion,Impaired flexibility,Postural dysfunction  Visit Diagnosis: Cervicalgia  Muscle weakness (generalized)  Abnormal posture     Problem List Patient Active Problem List   Diagnosis Date Noted  . Iron deficiency 06/16/2019  . Bilateral lower extremity edema 06/22/2018  . Osteoarthritis of both knees 04/26/2017  . 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 Rheumatoid arthritis involving multiple joints (HCC) 10/22/2011  . Anemia 05/28/2011  . POSTMENOPAUSAL STATUS 03/28/2009  . HLD (hyperlipidemia) 01/24/2008  . Essential hypertension 09/06/2006     Anette Guarneri, PT, DPT 02/15/20 9:16 AM   [facility] [address]  [address] [place], [place], [address] Phone: [phone]   Fax:  [phone]  Name: Erin Bass MRN: [id number] Date of Birth: [birth year]

## 2020-03-04 ENCOUNTER — Encounter: Payer: Self-pay | Admitting: Physical Therapy

## 2020-03-04 ENCOUNTER — Other Ambulatory Visit: Payer: Self-pay

## 2020-03-04 ENCOUNTER — Ambulatory Visit: Payer: BC Managed Care – PPO | Attending: Nurse Practitioner | Admitting: Physical Therapy

## 2020-03-04 DIAGNOSIS — M6281 Muscle weakness (generalized): Secondary | ICD-10-CM | POA: Insufficient documentation

## 2020-03-04 DIAGNOSIS — M542 Cervicalgia: Secondary | ICD-10-CM | POA: Insufficient documentation

## 2020-03-04 DIAGNOSIS — R293 Abnormal posture: Secondary | ICD-10-CM | POA: Diagnosis not present

## 2020-03-04 NOTE — Therapy (Signed)
[facility] [address]  [place], [place], [address] Phone: [phone]   Fax:  [phone]  Physical Therapy Treatment  Patient Details  Name: Erin Bass MRN: [id number] Date of Birth: 05/24/1960 Referring Provider (PT): Flossie Buffy, NP   Encounter Date: 03/04/2020   PT End of Session - 03/04/20 0842    Visit Number 2    Number of Visits 7    Date for PT Re-Evaluation 03/28/20    Authorization Type BCBS    PT Start Time 0800    PT Stop Time 0841    PT Time Calculation (min) 41 min    Activity Tolerance Patient tolerated treatment well;Patient limited by pain    Behavior During Therapy [facility] for tasks assessed/performed           Past Medical History:  Diagnosis Date  . Anemia   . Arthritis   . HTN (hypertension)     Past Surgical History:  Procedure Laterality Date  . ABDOMINAL HYSTERECTOMY     Partial- Uterus only  . bone spur - removal    . CESAREAN SECTION     x2  . PARTIAL HYSTERECTOMY  1/06   fibroids; ovaries left in   . TOTAL HIP ARTHROPLASTY Right 12/20/2012   Procedure: RIGHT TOTAL HIP ARTHROPLASTY ANTERIOR APPROACH;  Surgeon: Mauri Pole, MD;  Location: [facility];  Service: Orthopedics;  Laterality: Right;  . TUBAL LIGATION      There were no vitals filed for this visit.   Subjective Assessment - 03/04/20 0802    Subjective Reports "it is not as bad as it was" but still having some soreness/tenderness. Denies questions on HEP and reports consistent compliance.    Pertinent History HTN, anemia, R THA, RA    Diagnostic tests none    Patient Stated Goals decrease pain    Currently in Pain? Yes    Pain Score 5     Pain Location Neck    Pain Orientation Left;Right    Pain Descriptors / Indicators Aching    Pain Type Acute pain                             [facility] Adult PT Treatment/Exercise - 03/04/20 0001      Exercises   Exercises Neck      Neck  Exercises: Machines for Strengthening   UBE (Upper Arm Bike) L1.0 3 min forward/3 min back      Neck Exercises: Standing   Other Standing Exercises red TB row 10x; red TB extension 10x   cueing to maintain shoulder depression     Neck Exercises: Seated   Neck Retraction 5 reps;3 secs    Neck Retraction Limitations pt demonstrating trouble wiht proper positioning    Shoulder Rolls Backwards;Forwards;10 reps   good ROM   Other Seated Exercise scapular retraction 10x3"   cues to depress rather than elevate shoulders     Neck Exercises: Supine   Neck Retraction 5 reps;3 secs    Neck Retraction Limitations towel roll under head; slightly improved form    Cervical Rotation Right;Left;10 reps    Cervical Rotation Limitations SNAG to tolerance   manual cueing for hand positioning; more discomfort to R     Neck Exercises: Stretches   Levator Stretch Right;Left;1 rep;30 seconds    Levator Stretch Limitations holding onto edge of seat   c/o pain L>R but tolerable  Other Neck Stretches R/L SCM stretch 30" each   cues to avoid pushing into pain; manual cues for alignment                 PT Education - 03/04/20 0841    Education Details update to HEP; advised patient to speak to PCP about R ear pain and dizziness    Person(s) Educated Patient    Methods Explanation;Demonstration;Tactile cues;Verbal cues;Handout    Comprehension Verbalized understanding;Returned demonstration            PT Short Term Goals - 03/04/20 0848      PT SHORT TERM GOAL #1   Title Patient to be independent with initial HEP.    Time 2    Period Weeks    Status Partially Met    Target Date 02/29/20             PT Long Term Goals - 03/04/20 0849      PT LONG TERM GOAL #1   Title Patient to be independent with advanced HEP.    Time 6    Period Weeks    Status On-going      PT LONG TERM GOAL #2   Title Patient to demonstrate B UE strength >/=4+/5 and grip strength atleast 20lbs.    Time 6     Period Weeks    Status On-going      PT LONG TERM GOAL #3   Title Patient to demonstrate cervical AROM WFL and with only mild pain remaining.    Time 6    Period Weeks    Status On-going      PT LONG TERM GOAL #4   Title Patient to report 75% improvement in sleeping tolerance d/t neck pain.    Time 6    Period Weeks    Status On-going      PT LONG TERM GOAL #5   Title Patient to report tolerance for donning socks without pain limiting.    Time 6    Period Weeks    Status On-going                 Plan - 03/04/20 0844    Clinical Impression Statement Patient reporting improvement in symptoms since initial eval. Denies questions on HEP and reports consistent compliance. HEP was reviewed with cueing required for form. Patient however demonstrating good tolerance with these exercises. Patient with trouble demonstrating proper form with cervical retractions- better form in supine. Patient reported brief sensation of "spinning" upon laying supine which resolved within 15 seconds; nystagmus not observed. Patient with several questions about R ear pain and dizziness, thus advised patient to discuss with her PCP.  Proceeded with cervical stretching to patient's tolerance. Updated HEP with exercises that were well-tolerated today. Patient reported understanding and without complaints at end of session.    Comorbidities HTN, anemia, R THA, RA    PT Treatment/Interventions ADLs/Self Care Home Management;Cryotherapy;Electrical Stimulation;Moist Heat;Traction;Therapeutic exercise;Therapeutic activities;Functional mobility training;Ultrasound;Neuromuscular re-education;Patient/family education;Manual techniques;Taping;Energy conservation;Dry needling;Passive range of motion    PT Next Visit Plan progress cervical AROM and UE strengthening    Consulted and Agree with Plan of Care Patient           Patient will benefit from skilled therapeutic intervention in order to improve the following  deficits and impairments:  Hypomobility,Decreased activity tolerance,Decreased strength,Increased fascial restricitons,Impaired UE functional use,Pain,Increased muscle spasms,Improper body mechanics,Decreased range of motion,Impaired flexibility,Postural dysfunction  Visit Diagnosis: Cervicalgia  Muscle weakness (generalized)  Abnormal posture  Problem List Patient Active Problem List   Diagnosis Date Noted  . Iron deficiency 06/16/2019  . Bilateral lower extremity edema 06/22/2018  . Osteoarthritis of both knees 04/26/2017  . 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 Rheumatoid arthritis involving multiple joints ([place]) 10/22/2011  . Anemia 05/28/2011  . POSTMENOPAUSAL STATUS 03/28/2009  . HLD (hyperlipidemia) 01/24/2008  . Essential hypertension 09/06/2006    Janene Harvey, PT, DPT 03/04/20 8:50 AM   [facility] [address]  [place], [place], [address] Phone: [phone]   Fax:  [phone]  Name: Erin Bass MRN: [id number] Date of Birth: Oct 30, 1960

## 2020-03-11 ENCOUNTER — Other Ambulatory Visit: Payer: Self-pay

## 2020-03-11 ENCOUNTER — Encounter: Payer: Self-pay | Admitting: Physical Therapy

## 2020-03-11 ENCOUNTER — Ambulatory Visit: Payer: BC Managed Care – PPO | Admitting: Physical Therapy

## 2020-03-11 DIAGNOSIS — R293 Abnormal posture: Secondary | ICD-10-CM | POA: Diagnosis not present

## 2020-03-11 DIAGNOSIS — M542 Cervicalgia: Secondary | ICD-10-CM

## 2020-03-11 DIAGNOSIS — M6281 Muscle weakness (generalized): Secondary | ICD-10-CM

## 2020-03-11 NOTE — Therapy (Signed)
[facility] Outpatient Rehabilitation [facility] [address]  [address] [place], [place], [address] Phone: [phone]   Fax:  [phone]  Physical Therapy Treatment  Patient Details  Name: KORTNIE STOVALL MRN: [id number] Date of Birth: Oct 20, 1960 Referring Provider (PT): Anne Ng, NP   Encounter Date: 03/11/2020   PT End of Session - 03/11/20 0850    Visit Number 3    Number of Visits 7    Date for PT Re-Evaluation 03/28/20    Authorization Type BCBS    PT Start Time 0758    PT Stop Time 0854    PT Time Calculation (min) 56 min    Activity Tolerance Patient tolerated treatment well    Behavior During Therapy [facility] for tasks assessed/performed           Past Medical History:  Diagnosis Date  . Anemia   . Arthritis   . HTN (hypertension)     Past Surgical History:  Procedure Laterality Date  . ABDOMINAL HYSTERECTOMY     Partial- Uterus only  . bone spur - removal    . CESAREAN SECTION     x2  . PARTIAL HYSTERECTOMY  1/06   fibroids; ovaries left in   . TOTAL HIP ARTHROPLASTY Right 12/20/2012   Procedure: RIGHT TOTAL HIP ARTHROPLASTY ANTERIOR APPROACH;  Surgeon: Shelda Pal, MD;  Location: [facility];  Service: Orthopedics;  Laterality: Right;  . TUBAL LIGATION      There were no vitals filed for this visit.   Subjective Assessment - 03/11/20 0800    Subjective Has had a HA for the past couple of days and noticing redness in her eyes. Reports that she would like to get some imaging on her neck.    Pertinent History HTN, anemia, R THA, RA    Diagnostic tests none    Patient Stated Goals decrease pain    Currently in Pain? Yes    Pain Score 3     Pain Location Neck    Pain Orientation Right;Left    Pain Descriptors / Indicators Aching    Pain Type Acute pain    Multiple Pain Sites Yes    Pain Score 7    Pain Location Head    Pain Orientation Right;Left;Posterior;Anterior    Pain Descriptors / Indicators Aching                              [facility] Adult PT Treatment/Exercise - 03/11/20 0001      Self-Care   Self-Care Other Self-Care Comments    Other Self-Care Comments  edu, demonstration, practice using self-STM using ball on wall to R side of neck   pt noting good relief from STM and cavitation experienced in c-spine     Neck Exercises: Machines for Strengthening   UBE (Upper Arm Bike) L1.0 3 min forward/3 min back      Neck Exercises: Seated   Neck Retraction 10 reps;3 secs    Neck Retraction Limitations cueing for alignment and hand positioning   c/o nonpainful "popping"   Other Seated Exercise cervical retraction SNAG 10x to tolerance   cues to avoid pushing into pain; c/o nonpainful "popping"     Modalities   Modalities Moist Heat      Moist Heat Therapy   Number Minutes Moist Heat 10 Minutes    Moist Heat Location Cervical      Manual Therapy   Manual Therapy  Soft tissue mobilization;Myofascial release;Passive ROM    Manual therapy comments supine    Soft tissue mobilization STM to B cervical paraspinals, suboccipitals, UT    Myofascial Release manual TPR to R suboccipitals; suboccipital release    Passive ROM R/L suboccipital stretching to tolerance 2x30"                  PT Education - 03/11/20 0850    Education Details update to HEP    Person(s) Educated Patient    Methods Explanation;Demonstration;Tactile cues;Verbal cues;Handout    Comprehension Verbalized understanding;Returned demonstration            PT Short Term Goals - 03/11/20 0851      PT SHORT TERM GOAL #1   Title Patient to be independent with initial HEP.    Time 2    Period Weeks    Status Achieved    Target Date 02/29/20             PT Long Term Goals - 03/04/20 0849      PT LONG TERM GOAL #1   Title Patient to be independent with advanced HEP.    Time 6    Period Weeks    Status On-going      PT LONG TERM GOAL #2   Title Patient to demonstrate B UE strength >/=4+/5 and  grip strength atleast 20lbs.    Time 6    Period Weeks    Status On-going      PT LONG TERM GOAL #3   Title Patient to demonstrate cervical AROM WFL and with only mild pain remaining.    Time 6    Period Weeks    Status On-going      PT LONG TERM GOAL #4   Title Patient to report 75% improvement in sleeping tolerance d/t neck pain.    Time 6    Period Weeks    Status On-going      PT LONG TERM GOAL #5   Title Patient to report tolerance for donning socks without pain limiting.    Time 6    Period Weeks    Status On-going                 Plan - 03/11/20 7035    Clinical Impression Statement Patient arrived to session with report of HAs for the past 3 days. Expresses concern about her neck pain and HAs and would like to pursue imaging- advised patient to discuss this with her referring MD. Patient tolerated MT to address limited ROM and soft tissue restriction, with most tenderness demonstrated in R suboccipitals. Patient reporting decrease in HA intensity from to 7/10 to 3/10 after MT. Educated patient on self-STM for management at home which she reported relief with. Proceeded with cervical mobility work with patient reporting frequent "popping" with subsequent relief, noting improved ROM. Updated HEP with exercises that were well-tolerated today. Patient reported understanding. Ended session with moist heat to cervical spine at end of session. Patient reported improvement in pain at end of session.    Comorbidities HTN, anemia, R THA, RA    PT Treatment/Interventions ADLs/Self Care Home Management;Cryotherapy;Electrical Stimulation;Moist Heat;Traction;Therapeutic exercise;Therapeutic activities;Functional mobility training;Ultrasound;Neuromuscular re-education;Patient/family education;Manual techniques;Taping;Energy conservation;Dry needling;Passive range of motion    PT Next Visit Plan progress cervical AROM and UE strengthening    Consulted and Agree with Plan of Care Patient            Patient will benefit from skilled therapeutic intervention in order to  improve the following deficits and impairments:  Hypomobility,Decreased activity tolerance,Decreased strength,Increased fascial restricitons,Impaired UE functional use,Pain,Increased muscle spasms,Improper body mechanics,Decreased range of motion,Impaired flexibility,Postural dysfunction  Visit Diagnosis: Cervicalgia  Muscle weakness (generalized)  Abnormal posture     Problem List Patient Active Problem List   Diagnosis Date Noted  . Iron deficiency 06/16/2019  . Bilateral lower extremity edema 06/22/2018  . Osteoarthritis of both knees 04/26/2017  . 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 Rheumatoid arthritis involving multiple joints (HCC) 10/22/2011  . Anemia 05/28/2011  . POSTMENOPAUSAL STATUS 03/28/2009  . HLD (hyperlipidemia) 01/24/2008  . Essential hypertension 09/06/2006     Anette Guarneri, PT, DPT 03/11/20 9:24 AM   [facility] [address]  [address] [place], [place], [address] Phone: [phone]   Fax:  [phone]  Name: AMARYLLIS MALMQUIST MRN: [id number] Date of Birth: 10-28-1960

## 2020-03-18 ENCOUNTER — Other Ambulatory Visit: Payer: Self-pay

## 2020-03-18 ENCOUNTER — Encounter: Payer: Self-pay | Admitting: Physical Therapy

## 2020-03-18 ENCOUNTER — Ambulatory Visit: Payer: BC Managed Care – PPO | Admitting: Physical Therapy

## 2020-03-18 DIAGNOSIS — M6281 Muscle weakness (generalized): Secondary | ICD-10-CM | POA: Diagnosis not present

## 2020-03-18 DIAGNOSIS — M25561 Pain in right knee: Secondary | ICD-10-CM | POA: Diagnosis not present

## 2020-03-18 DIAGNOSIS — R293 Abnormal posture: Secondary | ICD-10-CM

## 2020-03-18 DIAGNOSIS — M542 Cervicalgia: Secondary | ICD-10-CM

## 2020-03-18 DIAGNOSIS — M25562 Pain in left knee: Secondary | ICD-10-CM | POA: Diagnosis not present

## 2020-03-18 DIAGNOSIS — M150 Primary generalized (osteo)arthritis: Secondary | ICD-10-CM | POA: Diagnosis not present

## 2020-03-18 DIAGNOSIS — M0579 Rheumatoid arthritis with rheumatoid factor of multiple sites without organ or systems involvement: Secondary | ICD-10-CM | POA: Diagnosis not present

## 2020-03-18 NOTE — Therapy (Signed)
[facility] [address]  [address] [place], [place], [address] Phone: [phone]   Fax:  [phone]  Physical Therapy Treatment  Patient Details  Name: SMT. DOBBERTIN MRN: [id number] Date of Birth: 04-09-1960 Referring Provider (PT): Anne Ng, NP   Encounter Date: 03/18/2020   PT End of Session - 03/18/20 0851    Visit Number 4    Number of Visits 7    Date for PT Re-Evaluation 03/28/20    Authorization Type BCBS    PT Start Time 0759    PT Stop Time 0852   miost heat   PT Time Calculation (min) 53 min    Activity Tolerance Patient tolerated treatment well    Behavior During Therapy [facility] for tasks assessed/performed           Past Medical History:  Diagnosis Date  . Anemia   . Arthritis   . HTN (hypertension)     Past Surgical History:  Procedure Laterality Date  . ABDOMINAL HYSTERECTOMY     Partial- Uterus only  . bone spur - removal    . CESAREAN SECTION     x2  . PARTIAL HYSTERECTOMY  1/06   fibroids; ovaries left in   . TOTAL HIP ARTHROPLASTY Right 12/20/2012   Procedure: RIGHT TOTAL HIP ARTHROPLASTY ANTERIOR APPROACH;  Surgeon: Shelda Pal, MD;  Location: [facility];  Service: Orthopedics;  Laterality: Right;  . TUBAL LIGATION      There were no vitals filed for this visit.   Subjective Assessment - 03/18/20 0800    Subjective "I'm doing pretty good today. Can't complain."    Pertinent History HTN, anemia, R THA, RA    Diagnostic tests none    Patient Stated Goals decrease pain    Currently in Pain? No/denies                             [facility] Adult PT Treatment/Exercise - 03/18/20 0001      Exercises   Exercises Hand      Neck Exercises: Machines for Strengthening   UBE (Upper Arm Bike) L2.0 3 min forward/3 min back      Neck Exercises: Seated   Cervical Rotation Right;Left;10 reps    Cervical Rotation Limitations R/L SNAG to tolerance   requiring correction of  hand position   Other Seated Exercise cervical extension SNAG 10x to tolerance   advised patient to use towel at home rather than TB     Neck Exercises: Supine   Other Supine Exercise B shoulder ER with yellow TB 2x10    Other Supine Exercise B horizontal abduction with yellow TB 2x10   cues for scap retraction     Hand Exercises   Digiticizer R hand 3x10 with green   excessive ulnar deviation   Other Hand Exercises practice and review of R hand grip strengthening exercises including full handed grip, 2 pt piunch, 3 pt pinch, key grip with yellow putty      Moist Heat Therapy   Number Minutes Moist Heat 10 Minutes    Moist Heat Location Cervical      Neck Exercises: Stretches   Other Neck Stretches R/L suboccipital stretch 2x30" each to tolerance with gentle self-OP   c/o pain towards R, improved with decreased OP and correction of form                 PT Education -  03/18/20 0850    Education Details update to HEP; administered yellow putty    Person(s) Educated Patient    Methods Explanation;Demonstration;Tactile cues;Verbal cues;Handout    Comprehension Verbalized understanding;Returned demonstration            PT Short Term Goals - 03/11/20 0851      PT SHORT TERM GOAL #1   Title Patient to be independent with initial HEP.    Time 2    Period Weeks    Status Achieved    Target Date 02/29/20             PT Long Term Goals - 03/04/20 0849      PT LONG TERM GOAL #1   Title Patient to be independent with advanced HEP.    Time 6    Period Weeks    Status On-going      PT LONG TERM GOAL #2   Title Patient to demonstrate B UE strength >/=4+/5 and grip strength atleast 20lbs.    Time 6    Period Weeks    Status On-going      PT LONG TERM GOAL #3   Title Patient to demonstrate cervical AROM WFL and with only mild pain remaining.    Time 6    Period Weeks    Status On-going      PT LONG TERM GOAL #4   Title Patient to report 75% improvement in  sleeping tolerance d/t neck pain.    Time 6    Period Weeks    Status On-going      PT LONG TERM GOAL #5   Title Patient to report tolerance for donning socks without pain limiting.    Time 6    Period Weeks    Status On-going                 Plan - 03/18/20 1638    Clinical Impression Statement Patient reporting no complaints at start of session today. Worked on gentle cervical stretching to patient's tolerance, with c/o pain towards R vs. L. Tolerance improved with decreased overpressure and correction of form. Patient still with tendency to guard and elevate shoulders during cervical ther-ex, requiring consistent cueing. Patient did seem to tolerate today's exercises better today, however still with c/o intermittent "popping" in the neck. R hand grip strengthening was performed with light resistance- patient tolerated this well. Updated HEP with exercises that were well-tolerated today. Ended session with moist heat to neck for post-exercise soreness. Patient without complaints at end of session.    Comorbidities HTN, anemia, R THA, RA    PT Treatment/Interventions ADLs/Self Care Home Management;Cryotherapy;Electrical Stimulation;Moist Heat;Traction;Therapeutic exercise;Therapeutic activities;Functional mobility training;Ultrasound;Neuromuscular re-education;Patient/family education;Manual techniques;Taping;Energy conservation;Dry needling;Passive range of motion    PT Next Visit Plan progress cervical AROM and UE strengthening    Consulted and Agree with Plan of Care Patient           Patient will benefit from skilled therapeutic intervention in order to improve the following deficits and impairments:  Hypomobility,Decreased activity tolerance,Decreased strength,Increased fascial restricitons,Impaired UE functional use,Pain,Increased muscle spasms,Improper body mechanics,Decreased range of motion,Impaired flexibility,Postural dysfunction  Visit Diagnosis: Cervicalgia  Muscle  weakness (generalized)  Abnormal posture     Problem List Patient Active Problem List   Diagnosis Date Noted  . Iron deficiency 06/16/2019  . Bilateral lower extremity edema 06/22/2018  . Osteoarthritis of both knees 04/26/2017  . 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 Rheumatoid arthritis  involving multiple joints (HCC) 10/22/2011  . Anemia 05/28/2011  . POSTMENOPAUSAL STATUS 03/28/2009  . HLD (hyperlipidemia) 01/24/2008  . Essential hypertension 09/06/2006     Anette Guarneri, PT, DPT 03/18/20 9:00 AM   [facility] [address]  [address] [place], [place], [address] Phone: [phone]   Fax:  [phone]  Name: JAILEE JAQUEZ MRN: [id number] Date of Birth: 08/09/1960

## 2020-03-26 ENCOUNTER — Encounter: Payer: BC Managed Care – PPO | Admitting: Physical Therapy

## 2020-03-27 ENCOUNTER — Other Ambulatory Visit: Payer: Self-pay

## 2020-03-27 ENCOUNTER — Encounter: Payer: Self-pay | Admitting: Physical Therapy

## 2020-03-27 ENCOUNTER — Ambulatory Visit: Payer: BC Managed Care – PPO | Attending: Nurse Practitioner | Admitting: Physical Therapy

## 2020-03-27 VITALS — BP 140/90 | HR 93

## 2020-03-27 DIAGNOSIS — M6281 Muscle weakness (generalized): Secondary | ICD-10-CM | POA: Insufficient documentation

## 2020-03-27 DIAGNOSIS — R293 Abnormal posture: Secondary | ICD-10-CM | POA: Diagnosis not present

## 2020-03-27 DIAGNOSIS — M542 Cervicalgia: Secondary | ICD-10-CM | POA: Insufficient documentation

## 2020-03-27 NOTE — Therapy (Addendum)
[facility] [address]  [place], [place], [address] Phone: [phone]   Fax:  [phone]  Physical Therapy Treatment  Patient Details  Name: Erin Bass MRN: [id number] Date of Birth: February 26, 1960 Referring Provider (PT): Flossie Buffy, NP   Encounter Date: 03/27/2020   PT End of Session - 03/27/20 0843    Visit Number 5    Number of Visits 7    Date for PT Re-Evaluation 03/28/20    Authorization Type BCBS    PT Start Time 0758    PT Stop Time 0841    PT Time Calculation (min) 43 min    Activity Tolerance Patient tolerated treatment well;Patient limited by pain    Behavior During Therapy [facility] for tasks assessed/performed           Past Medical History:  Diagnosis Date  . Anemia   . Arthritis   . HTN (hypertension)     Past Surgical History:  Procedure Laterality Date  . ABDOMINAL HYSTERECTOMY     Partial- Uterus only  . bone spur - removal    . CESAREAN SECTION     x2  . PARTIAL HYSTERECTOMY  1/06   fibroids; ovaries left in   . TOTAL HIP ARTHROPLASTY Right 12/20/2012   Procedure: RIGHT TOTAL HIP ARTHROPLASTY ANTERIOR APPROACH;  Surgeon: Mauri Pole, MD;  Location: [facility];  Service: Orthopedics;  Laterality: Right;  . TUBAL LIGATION      Vitals:   03/27/20 0812  BP: 140/90  Pulse: 93  SpO2: 95%     Subjective Assessment - 03/27/20 0800    Subjective Feeling ready to wrap up with therapy. Reports 60-70% improvement in the neck. Worried about having a heart problem d/t cramping in her feet, neck pain, and sometimes waking up gasping for air. Denies chest pain or SOB. Denies having a Cardiologist.    Pertinent History HTN, anemia, R THA, RA    Currently in Pain? Yes    Pain Score 7     Pain Location Neck    Pain Orientation Right;Left    Pain Descriptors / Indicators Aching    Pain Type Acute pain              [facility] PT Assessment - 03/27/20 0001      Assessment   Medical  Diagnosis Cervical paraspinal muscle spasm, cervical DDD    Referring Provider (PT) Flossie Buffy, NP    Onset Date/Surgical Date 12/16/19      Observation/Other Assessments   Focus on Therapeutic Outcomes (FOTO)  cervical: 63 (59 predicted)      AROM   Cervical Flexion 51    Cervical Extension 88   c/o "pop" and moderate-severe pain   Cervical - Right Side Bend 34    Cervical - Left Side Bend 34    Cervical - Right Rotation 56   moderate pain   Cervical - Left Rotation 62   moderate pain     Strength   Right Shoulder Flexion 4+/5    Right Shoulder ABduction 4+/5    Right Shoulder Internal Rotation 4+/5    Right Shoulder External Rotation 4/5    Left Shoulder Flexion 4+/5    Left Shoulder ABduction 4+/5    Left Shoulder Internal Rotation 4/5    Left Shoulder External Rotation 4+/5    Right Elbow Flexion 4+/5    Right Elbow Extension 4+/5    Left Elbow Flexion 4+/5  Left Elbow Extension 4+/5    Right Wrist Flexion 4+/5    Right Wrist Extension 4/5    Left Wrist Flexion 4+/5    Left Wrist Extension 4+/5    Right Hand Grip (lbs) 9.33   14, 9, 5   Left Hand Grip (lbs) 13   13, 13, 13                        [facility] Adult PT Treatment/Exercise - 03/27/20 0001      Neck Exercises: Machines for Strengthening   UBE (Upper Arm Bike) L2.0 3 min forward/3 min back      Neck Exercises: Seated   Other Seated Exercise R/L shoulder IR with red TB 10x    Other Seated Exercise B shoulder ER with red TB 10x                  PT Education - 03/27/20 0842    Education Details update/consolidation of HEP; discussion on objective progrss and remaining impairments; advised patient to contact her PCP about other concerns discussed today    Person(s) Educated Patient    Methods Explanation;Demonstration;Tactile cues;Verbal cues;Handout    Comprehension Verbalized understanding;Returned demonstration            PT Short Term Goals - 03/27/20 0804      PT  SHORT TERM GOAL #1   Title Patient to be independent with initial HEP.    Time 2    Period Weeks    Status Achieved    Target Date 02/29/20             PT Long Term Goals - 03/27/20 0804      PT LONG TERM GOAL #1   Title Patient to be independent with advanced HEP.    Time 6    Period Weeks    Status Achieved      PT LONG TERM GOAL #2   Title Patient to demonstrate B UE strength >/=4+/5 and grip strength atleast 20lbs.    Time 6    Period Weeks    Status Partially Met   Good improvement in B shoulder, elbow, wrist, and grip strength was demonstrated today. Mild remaining weakness evident in R shoulder ER, L shoulder IR, and grip     PT LONG TERM GOAL #3   Title Patient to demonstrate cervical AROM WFL and with only mild pain remaining.    Time 6    Period Weeks    Status Partially Met   improved in flexion, extension, and L sidebending and rotation     PT LONG TERM GOAL #4   Title Patient to report 75% improvement in sleeping tolerance d/t neck pain.    Time 6    Period Weeks    Status Partially Met   reportds 25%     PT LONG TERM GOAL #5   Title Patient to report tolerance for donning socks without pain limiting.    Time 6    Period Weeks    Status Partially Met   reports 30% improvement                Plan - 03/27/20 0843    Clinical Impression Statement Patient reporting 60-70% improvement in neck pain since initial eval. Reports improvement in "popping" sensation. Patient with new concern of having a heart problem d/t cramping in her feet, neck pain, and sometimes waking up gasping for air. Took vitals, with slightly elevated BP today  but WFL.  Advised patient to contact her PCP to address these new concerns. Patient reports 25% improvement in sleeping tolerance and reports 30% improvement in tolerance for putting on socks. Good improvement in B shoulder, elbow, wrist, and grip strength was demonstrated today. Mild remaining weakness evident in R shoulder ER,  L shoulder IR, and grip. Cervical AROM has improved in flexion, extension, and L sidebending and rotation. Worked on addressing remaining shoulder weakness with ther-ex with cueing to correct form. Updated HEP with exercises to address remaining impairments; patient reported understanding and without complaints at end of session. Patient has made good progress towards goals, is independent with HEP, and ready for 30 day hold per her request at this time.    Comorbidities HTN, anemia, R THA, RA    PT Treatment/Interventions ADLs/Self Care Home Management;Cryotherapy;Electrical Stimulation;Moist Heat;Traction;Therapeutic exercise;Therapeutic activities;Functional mobility training;Ultrasound;Neuromuscular re-education;Patient/family education;Manual techniques;Taping;Energy conservation;Dry needling;Passive range of motion    PT Next Visit Plan 30 day hold at this time    Consulted and Agree with Plan of Care Patient           Patient will benefit from skilled therapeutic intervention in order to improve the following deficits and impairments:  Hypomobility,Decreased activity tolerance,Decreased strength,Increased fascial restricitons,Impaired UE functional use,Pain,Increased muscle spasms,Improper body mechanics,Decreased range of motion,Impaired flexibility,Postural dysfunction  Visit Diagnosis: Cervicalgia  Muscle weakness (generalized)  Abnormal posture     Problem List Patient Active Problem List   Diagnosis Date Noted  . Iron deficiency 06/16/2019  . Bilateral lower extremity edema 06/22/2018  . Osteoarthritis of both knees 04/26/2017  . 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 Rheumatoid arthritis involving multiple joints ([place]) 10/22/2011  . Anemia 05/28/2011  . POSTMENOPAUSAL STATUS 03/28/2009  . HLD (hyperlipidemia) 01/24/2008  . Essential hypertension 09/06/2006     Janene Harvey, PT, DPT 03/27/20 8:50 AM   [facility] [address]  [place], [place], [address] Phone: [phone]   Fax:  [phone]  Name: JAVAE BRAATEN MRN: [id number] Date of Birth: 08-Dec-1960    PHYSICAL THERAPY DISCHARGE SUMMARY  Visits from Start of Care: 5  Current functional level related to goals / functional outcomes: See above clinical impression; patient did not return during hold    Remaining deficits: Limited UE strength, cervical ROM, activity tolerance   Education / Equipment: HEP  Plan: Patient agrees to discharge.  Patient goals were partially met. Patient is being discharged due to the patient's request.  ?????     Janene Harvey, PT, DPT 05/06/20 1:36 PM

## 2020-04-03 ENCOUNTER — Ambulatory Visit (INDEPENDENT_AMBULATORY_CARE_PROVIDER_SITE_OTHER): Payer: BC Managed Care – PPO

## 2020-04-03 ENCOUNTER — Other Ambulatory Visit: Payer: Self-pay

## 2020-04-03 ENCOUNTER — Encounter: Payer: Self-pay | Admitting: Nurse Practitioner

## 2020-04-03 ENCOUNTER — Ambulatory Visit: Payer: BC Managed Care – PPO | Admitting: Nurse Practitioner

## 2020-04-03 VITALS — BP 124/86 | HR 93 | Temp 97.3°F | Ht 61.5 in | Wt 199.4 lb

## 2020-04-03 DIAGNOSIS — E876 Hypokalemia: Secondary | ICD-10-CM | POA: Diagnosis not present

## 2020-04-03 DIAGNOSIS — I10 Essential (primary) hypertension: Secondary | ICD-10-CM

## 2020-04-03 DIAGNOSIS — M542 Cervicalgia: Secondary | ICD-10-CM | POA: Insufficient documentation

## 2020-04-03 DIAGNOSIS — T502X5A Adverse effect of carbonic-anhydrase inhibitors, benzothiadiazides and other diuretics, initial encounter: Secondary | ICD-10-CM

## 2020-04-03 DIAGNOSIS — M62838 Other muscle spasm: Secondary | ICD-10-CM

## 2020-04-03 NOTE — Assessment & Plan Note (Addendum)
BP at goal with chlorthialidone BP Readings from Last 3 Encounters:  04/03/20 124/86  03/27/20 140/90  01/30/20 130/86   Repeat BMP: Mild decrease in renal function and potassium Decrease chlorthalidone dose to 15mg  and added potassium supplement.

## 2020-04-03 NOTE — Patient Instructions (Addendum)
Go to lab for neck x-ray and blood draw Can use biofreeze gel or blue emu cream or lidocaine patch on right side of neck as directed on package.

## 2020-04-03 NOTE — Progress Notes (Addendum)
Subjective:  [name], female    DOB: 1960-11-02  Age: 60 y.o. MRN: [id number]  CC: Acute Visit (Pt c/o pain in the back of her neck on the right side and states she went to PT and it helped a little but it did not make the pain go away. Pt states now she wakes up with red eyes and a slight headache which she thinks is associated with the neck pain. )  HPI  Essential hypertension BP at goal with chlorthialidone BP Readings from Last 3 Encounters:  04/03/20 124/86  03/27/20 140/90  01/30/20 130/86   Repeat BMP: Mild decrease in renal function and potassium Decrease chlorthalidone dose to 15mg  and added potassium supplement.  Neck pain on right side Ongoing >1year, worse in supine position, no radiculopathy or weakness or dysphagia Minimal improvement with mobic, robaxin and oral prednisone. Completed outpatient PT: 60% improvement.  Ordered cervical spine x-ray She declined referral to ortho-spine Continue mobic Advised to try biofreeze gel or blue emu cream or lidocaine patch on right side of neck as directed on package.  Reviewed past Medical, Social and Family history today.  Outpatient Medications Prior to Visit  Medication Sig Dispense Refill  . Black Pepper-Turmeric 3-500 MG CAPS Take by mouth.    . DULoxetine (CYMBALTA) 60 MG capsule Take 60 mg by mouth daily.    . ferrous sulfate 325 (65 FE) MG tablet Take 325 mg by mouth daily with breakfast.    . meloxicam (MOBIC) 15 MG tablet TAKE 1 TABLET EVERY DAY 30 tablet 3  . RINVOQ 15 MG TB24 Take 1 tablet by mouth daily.    . chlorthalidone (HYGROTON) 25 MG tablet TAKE 1 TABLET EVERY DAY 90 tablet 1  . methocarbamol (ROBAXIN) 500 MG tablet Take 1 tablet (500 mg total) by mouth every 8 (eight) hours as needed for muscle spasms. (Patient not taking: Reported on 04/03/2020) 21 tablet 0  . prednisoLONE 5 MG TABS tablet Take by mouth. (Patient not taking: No sig reported)     No facility-administered medications  prior to visit.    ROS See HPI  Objective:  BP 124/86 (BP Location: Left Arm, Patient Position: Sitting, Cuff Size: Large)   Pulse 93   Temp (!) 97.3 F (36.3 C) (Temporal)   Ht 5' 1.5" (1.562 m)   Wt 199 lb 6.4 oz (90.4 kg)   SpO2 97%   BMI 37.07 kg/m   Physical Exam Vitals reviewed.  Neck:     Vascular: No carotid bruit.  Cardiovascular:     Rate and Rhythm: Normal rate.     Pulses: Normal pulses.  Pulmonary:     Effort: Pulmonary effort is normal.  Musculoskeletal:     Right shoulder: Normal.     Left shoulder: Normal.     Right upper arm: Normal.     Left upper arm: Normal.     Cervical back: Spasms, tenderness and crepitus present. No rigidity or torticollis. Pain with movement present.     Right lower leg: No edema.     Left lower leg: No edema.  Lymphadenopathy:     Cervical: No cervical adenopathy.  Skin:    Findings: No erythema or rash.  Neurological:     Mental Status: She is alert and oriented to person, place, and time.    Assessment & Plan:  This visit occurred during the SARS-CoV-2 public health emergency.  Safety protocols were in place, including screening questions prior to the visit, additional  usage of staff PPE, and extensive cleaning of exam room while observing appropriate contact time as indicated for disinfecting solutions.   Terri was seen today for acute visit.  Diagnoses and all orders for this visit:  Neck pain on right side -     DG Cervical Spine Complete  Cervical paraspinal muscle spasm -     DG Cervical Spine Complete  Essential hypertension -     Basic metabolic panel -     potassium chloride SA (KLOR-CON) 20 MEQ tablet; Take 1 tablet (20 mEq total) by mouth daily. -     chlorthalidone (HYGROTEN) 15 MG tablet; Take 1 tablet (15 mg total) by mouth daily.  Diuretic-induced hypokalemia -     potassium chloride SA (KLOR-CON) 20 MEQ tablet; Take 1 tablet (20 mEq total) by mouth daily.   Problem List Items Addressed This  Visit      Cardiovascular and Mediastinum   Essential hypertension    BP at goal with chlorthialidone BP Readings from Last 3 Encounters:  04/03/20 124/86  03/27/20 140/90  01/30/20 130/86   Repeat BMP: Mild decrease in renal function and potassium Decrease chlorthalidone dose to 15mg  and added potassium supplement.      Relevant Medications   potassium chloride SA (KLOR-CON) 20 MEQ tablet   chlorthalidone (HYGROTEN) 15 MG tablet   Other Relevant Orders   Basic metabolic panel (Completed)     Other   Neck pain on right side - Primary    Ongoing >1year, worse in supine position, no radiculopathy or weakness or dysphagia Minimal improvement with mobic, robaxin and oral prednisone. Completed outpatient PT: 60% improvement.  Ordered cervical spine x-ray She declined referral to ortho-spine Continue mobic Advised to try biofreeze gel or blue emu cream or lidocaine patch on right side of neck as directed on package.      Relevant Orders   DG Cervical Spine Complete (Completed)    Other Visit Diagnoses    Cervical paraspinal muscle spasm       Relevant Orders   DG Cervical Spine Complete (Completed)   Diuretic-induced hypokalemia       Relevant Medications   potassium chloride SA (KLOR-CON) 20 MEQ tablet      Follow-up: Return if symptoms worsen or fail to improve.  , NP

## 2020-04-03 NOTE — Assessment & Plan Note (Signed)
Ongoing >1year, worse in supine position, no radiculopathy or weakness or dysphagia Minimal improvement with mobic, robaxin and oral prednisone. Completed outpatient PT: 60% improvement.  Ordered cervical spine x-ray She declined referral to ortho-spine Continue mobic Advised to try biofreeze gel or blue emu cream or lidocaine patch on right side of neck as directed on package.

## 2020-04-04 ENCOUNTER — Other Ambulatory Visit: Payer: Self-pay | Admitting: Nurse Practitioner

## 2020-04-04 LAB — BASIC METABOLIC PANEL
BUN: 23 mg/dL (ref 6–23)
CO2: 31 mEq/L (ref 19–32)
Calcium: 10.1 mg/dL (ref 8.4–10.5)
Chloride: 101 mEq/L (ref 96–112)
Creatinine, Ser: 1.09 mg/dL (ref 0.40–1.20)
GFR: 55.34 mL/min — ABNORMAL LOW (ref >60.00)
Glucose, Bld: 90 mg/dL (ref 70–99)
Potassium: 3.4 mEq/L — ABNORMAL LOW (ref 3.5–5.1)
Sodium: 142 mEq/L (ref 135–145)

## 2020-04-08 ENCOUNTER — Other Ambulatory Visit: Payer: Self-pay | Admitting: Nurse Practitioner

## 2020-04-08 DIAGNOSIS — M5030 Other cervical disc degeneration, unspecified cervical region: Secondary | ICD-10-CM

## 2020-04-08 DIAGNOSIS — M542 Cervicalgia: Secondary | ICD-10-CM

## 2020-04-10 DIAGNOSIS — M47812 Spondylosis without myelopathy or radiculopathy, cervical region: Secondary | ICD-10-CM | POA: Diagnosis not present

## 2020-04-10 DIAGNOSIS — M542 Cervicalgia: Secondary | ICD-10-CM | POA: Diagnosis not present

## 2020-04-10 DIAGNOSIS — I10 Essential (primary) hypertension: Secondary | ICD-10-CM | POA: Diagnosis not present

## 2020-04-10 DIAGNOSIS — M069 Rheumatoid arthritis, unspecified: Secondary | ICD-10-CM | POA: Diagnosis not present

## 2020-04-10 MED ORDER — CHLORTHALIDONE 15 MG PO TABS: 15.0000 mg | ORAL_TABLET | Freq: Every day | ORAL | 1 refills | Status: DC

## 2020-04-10 MED ORDER — POTASSIUM CHLORIDE CRYS ER 20 MEQ PO TBCR: 20.0000 meq | EXTENDED_RELEASE_TABLET | Freq: Every day | ORAL | 1 refills | Status: DC

## 2020-04-10 NOTE — Addendum Note (Signed)
Addended by: Michaela Corner on: 04/10/2020 04:29 PM   Modules accepted: Orders

## 2020-04-24 ENCOUNTER — Emergency Department (HOSPITAL_BASED_OUTPATIENT_CLINIC_OR_DEPARTMENT_OTHER)
Admission: EM | Admit: 2020-04-24 | Discharge: 2020-04-24 | Disposition: A | Discharge status: Home / Self Care | Payer: BC Managed Care – PPO | Attending: Emergency Medicine | Admitting: Emergency Medicine

## 2020-04-24 ENCOUNTER — Other Ambulatory Visit: Payer: Self-pay

## 2020-04-24 DIAGNOSIS — R040 Epistaxis: Secondary | ICD-10-CM | POA: Diagnosis not present

## 2020-04-24 DIAGNOSIS — Z96641 Presence of right artificial hip joint: Secondary | ICD-10-CM | POA: Insufficient documentation

## 2020-04-24 DIAGNOSIS — Z79899 Other long term (current) drug therapy: Secondary | ICD-10-CM | POA: Diagnosis not present

## 2020-04-24 DIAGNOSIS — R0981 Nasal congestion: Secondary | ICD-10-CM | POA: Diagnosis not present

## 2020-04-24 DIAGNOSIS — I10 Essential (primary) hypertension: Secondary | ICD-10-CM | POA: Diagnosis not present

## 2020-04-24 LAB — BASIC METABOLIC PANEL
Anion gap: 9 (ref 5–15)
BUN: 17 mg/dL (ref 6–20)
CO2: 28 mmol/L (ref 22–32)
Calcium: 9.6 mg/dL (ref 8.9–10.3)
Chloride: 101 mmol/L (ref 98–111)
Creatinine, Ser: 0.74 mg/dL (ref 0.44–1.00)
GFR, Estimated: >60 mL/min (ref >60)
Glucose, Bld: 103 mg/dL — ABNORMAL HIGH (ref 70–99)
Potassium: 3.4 mmol/L — ABNORMAL LOW (ref 3.5–5.1)
Sodium: 138 mmol/L (ref 135–145)

## 2020-04-24 LAB — CBC
HCT: 36.8 % (ref 36.0–46.0)
Hemoglobin: 11.7 g/dL — ABNORMAL LOW (ref 12.0–15.0)
MCH: 23.6 pg — ABNORMAL LOW (ref 26.0–34.0)
MCHC: 31.8 g/dL (ref 30.0–36.0)
MCV: 74.3 fL — ABNORMAL LOW (ref 80.0–100.0)
Platelets: 271 10*3/uL (ref 150–400)
RBC: 4.95 MIL/uL (ref 3.87–5.11)
RDW: 13.6 % (ref 11.5–15.5)
WBC: 8.1 10*3/uL (ref 4.0–10.5)
nRBC: 0 % (ref 0.0–0.2)

## 2020-04-24 MED ORDER — AMLODIPINE BESYLATE 5 MG PO TABS: 5.0000 mg | ORAL_TABLET | Freq: Every day | ORAL | 0 refills | Status: DC

## 2020-04-24 MED ORDER — AMLODIPINE BESYLATE 5 MG PO TABS
5.0000 mg | ORAL_TABLET | Freq: Once | ORAL | Status: AC
  Administered 2020-04-24: 5 mg via ORAL
  Filled 2020-04-24: qty 1

## 2020-04-24 NOTE — ED Provider Notes (Signed)
[facility] EMERGENCY DEPARTMENT Provider Note   CSN: [id number] Arrival date & time: 04/24/20  5621     History Chief Complaint  Patient presents with  . Hypertension  . Epistaxis    Erin Bass is a 60 y.o. female.  HPI   Patient presents to the ED for evaluation of hypertension.  Patient states she had a nosebleed this morning.  She has had some congestion with the pollen.  She did try taking some NyQuil last evening.  She has never had any issues with that medication in the past.  Patient does have a history of hypertension and has been taking chlorthalidone.  Patient states she is never had severely elevated blood pressure like this before.  This morning when she took her blood pressure was severely elevated in the systolic of 190s.  She is not having any trouble with chest pain.  No headaches.  No numbness or weakness.  No fevers or chills.  She was not sure if the blood pressure was related to the nosebleed.  Patient came to the ED for further evaluation.  Past Medical History:  Diagnosis Date  . Anemia   . Arthritis   . HTN (hypertension)     Patient Active Problem List   Diagnosis Date Noted  . Neck pain on right side 04/03/2020  . Iron deficiency 06/16/2019  . Bilateral lower extremity edema 06/22/2018  . Osteoarthritis of both knees 04/26/2017  . 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 Rheumatoid arthritis involving multiple joints (HCC) 10/22/2011  . Anemia 05/28/2011  . POSTMENOPAUSAL STATUS 03/28/2009  . HLD (hyperlipidemia) 01/24/2008  . Essential hypertension 09/06/2006    Past Surgical History:  Procedure Laterality Date  . ABDOMINAL HYSTERECTOMY     Partial- Uterus only  . bone spur - removal    . CESAREAN SECTION     x2  . PARTIAL HYSTERECTOMY  1/06   fibroids; ovaries left in   . TOTAL HIP ARTHROPLASTY Right 12/20/2012   Procedure: RIGHT TOTAL HIP ARTHROPLASTY ANTERIOR APPROACH;  Surgeon:  Shelda Pal, MD;  Location: [facility];  Service: Orthopedics;  Laterality: Right;  . TUBAL LIGATION       OB History   No obstetric history on file.     Family History  Problem Relation Age of Onset  . Heart disease Father   . Anemia Mother   . Depression Other        both sides of family     Social History   Tobacco Use  . Smoking status: Never Smoker  . Smokeless tobacco: Never Used  Vaping Use  . Vaping Use: Never used  Substance Use Topics  . Alcohol use: Yes    Comment: occassionally  . Drug use: No    Home Medications Prior to Admission medications   Medication Sig Start Date End Date Taking? Authorizing Provider  amLODipine (NORVASC) 5 MG tablet Take 1 tablet (5 mg total) by mouth daily. 04/24/20  Yes Linwood Dibbles, MD  Black Pepper-Turmeric 3-500 MG CAPS Take by mouth.    [provider]  chlorthalidone (HYGROTEN) 15 MG tablet Take 1 tablet (15 mg total) by mouth daily. 04/10/20   Nche, Bonna Gains, NP  DULoxetine (CYMBALTA) 60 MG capsule Take 60 mg by mouth daily. 11/07/19   [provider]  ferrous sulfate 325 (65 FE) MG tablet Take 325 mg by mouth daily with breakfast.    [provider]  meloxicam (MOBIC) 15 MG tablet TAKE 1 TABLET EVERY DAY 01/03/20   Nche, Bonna Gains, NP  potassium chloride SA (KLOR-CON) 20 MEQ tablet Take 1 tablet (20 mEq total) by mouth daily. 04/10/20   Nche, Bonna Gains, NP  RINVOQ 15 MG TB24 Take 1 tablet by mouth daily. 10/26/19   [provider]    Allergies    Leflunomide, Adalimumab, Methotrexate derivatives, and Simponi [golimumab]  Review of Systems   Review of Systems  All other systems reviewed and are negative.   Physical Exam Updated Vital Signs BP (!) 178/102   Pulse (!) 152   Temp 98.7 F (37.1 C) (Oral)   Resp 18   Ht 1.549 m (5\' 1" )   Wt 86.2 kg   SpO2 100%   BMI 35.90 kg/m   Physical Exam Vitals and nursing note reviewed.  Constitutional:      General: She is not in  acute distress.    Appearance: She is well-developed.  HENT:     Head: Normocephalic and atraumatic.     Right Ear: External ear normal.     Left Ear: External ear normal.     Nose:     Comments: Small amount of blood noted in the left nares, no active bleeding, no mass Eyes:     General: No scleral icterus.       Right eye: No discharge.        Left eye: No discharge.     Conjunctiva/sclera: Conjunctivae normal.  Neck:     Trachea: No tracheal deviation.  Cardiovascular:     Rate and Rhythm: Normal rate and regular rhythm.  Pulmonary:     Effort: Pulmonary effort is normal. No respiratory distress.     Breath sounds: Normal breath sounds. No stridor. No wheezing or rales.  Abdominal:     General: Bowel sounds are normal. There is no distension.     Palpations: Abdomen is soft.     Tenderness: There is no abdominal tenderness. There is no guarding or rebound.  Musculoskeletal:        General: No tenderness.     Cervical back: Neck supple.  Skin:    General: Skin is warm and dry.     Findings: No rash.  Neurological:     Mental Status: She is alert.     Cranial Nerves: No cranial nerve deficit (no facial droop, extraocular movements intact, no slurred speech).     Sensory: No sensory deficit.     Motor: No abnormal muscle tone or seizure activity.     Coordination: Coordination normal.     ED Results / Procedures / Treatments   Labs (all labs ordered are listed, but only abnormal results are displayed) Labs Reviewed  CBC - Abnormal; Notable for the following components:      Result Value   Hemoglobin 11.7 (*)    MCV 74.3 (*)    MCH 23.6 (*)    All other components within normal limits  BASIC METABOLIC PANEL - Abnormal; Notable for the following components:   Potassium 3.4 (*)    Glucose, Bld 103 (*)    All other components within normal limits    EKG EKG Interpretation  Date/Time:  Wednesday April 24 2020 07:52:25 EDT Ventricular Rate:  77 PR  Interval:  183 QRS Duration: 119 QT Interval:  402 QTC Calculation: 455 R Axis:   -55 Text Interpretation: Sinus rhythm Left anterior fascicular block Left ventricular hypertrophy Borderline T abnormalities, inferior leads No  old tracing to compare Confirmed by Linwood Dibbles [phone]) on 04/24/2020 8:00:16 AM   Radiology No results found.  Procedures Procedures   Medications Ordered in ED Medications  amLODipine (NORVASC) tablet 5 mg (5 mg Oral Given 04/24/20 0755)    ED Course  I have reviewed the triage vital signs and the nursing notes.  Pertinent labs & imaging results that were available during my care of the patient were reviewed by me and considered in my medical decision making (see chart for details).    MDM Rules/Calculators/A&P                          Patient presented to the ED for evaluation of high blood pressure.  Patient was otherwise asymptomatic with the exception of having a nosebleed earlier.  Is not having chest pain.  No headache.  No numbness or weakness.  Patient was hypertensive.  She was given a dose of Norvasc.  Her blood pressure slowly improving although she still is hypertensive.  Her laboratory tests are otherwise unremarkable.  No signs of any acute EKG findings or electrolyte abnormalities.  I will start the patient on 5 mg Norvasc in addition to her other medications.  Recommend close outpatient follow-up with her primary doctor to be rechecked.  Warning signs precautions discussed. Final Clinical Impression(s) / ED Diagnoses Final diagnoses:  Hypertension, unspecified type    Rx / DC Orders ED Discharge Orders         Ordered    amLODipine (NORVASC) 5 MG tablet  Daily        04/24/20 1013           Linwood Dibbles, MD 04/24/20 1017

## 2020-04-24 NOTE — ED Notes (Signed)
Pt advised of ED process - has call bell and remote for TV - lights turned down PPR

## 2020-04-24 NOTE — ED Triage Notes (Signed)
High BP and nose bleed

## 2020-04-24 NOTE — ED Notes (Signed)
Pt given water and granola bar - on her way to her MRI appointment in [facility]

## 2020-04-24 NOTE — Discharge Instructions (Addendum)
Start taking the Norvasc in addition to your other blood pressure medications.  Keep a record of your blood pressure daily.  Follow-up with your primary care doctor to be rechecked to see if that medication needs to be adjusted any further

## 2020-04-24 NOTE — ED Notes (Signed)
Pt ambulated to the bathroom gait steady. 

## 2020-04-24 NOTE — ED Notes (Addendum)
Pt c/o high BP and nose bleed Denies N/V/D, headache, CP, numbness

## 2020-04-26 ENCOUNTER — Other Ambulatory Visit: Payer: Self-pay | Admitting: Neurological Surgery

## 2020-04-26 DIAGNOSIS — M47812 Spondylosis without myelopathy or radiculopathy, cervical region: Secondary | ICD-10-CM

## 2020-05-06 ENCOUNTER — Other Ambulatory Visit: Payer: Self-pay | Admitting: Nurse Practitioner

## 2020-05-06 NOTE — Telephone Encounter (Signed)
Last OV 04/03/20 Last fill 01/03/20  #30/3

## 2020-05-08 ENCOUNTER — Telehealth: Payer: Self-pay | Admitting: Nurse Practitioner

## 2020-05-08 NOTE — Telephone Encounter (Signed)
Per Claris Gower, patient needs to schedule a follow up for Hypertension. Called and left a detailed message to give the office a call to schedule.

## 2020-05-15 ENCOUNTER — Other Ambulatory Visit: Payer: BC Managed Care – PPO

## 2020-05-21 ENCOUNTER — Other Ambulatory Visit (HOSPITAL_BASED_OUTPATIENT_CLINIC_OR_DEPARTMENT_OTHER): Payer: Self-pay | Admitting: Nurse Practitioner

## 2020-05-21 DIAGNOSIS — Z1231 Encounter for screening mammogram for malignant neoplasm of breast: Secondary | ICD-10-CM

## 2020-05-22 ENCOUNTER — Other Ambulatory Visit: Payer: Self-pay | Admitting: Neurological Surgery

## 2020-05-22 DIAGNOSIS — M47812 Spondylosis without myelopathy or radiculopathy, cervical region: Secondary | ICD-10-CM

## 2020-05-23 ENCOUNTER — Inpatient Hospital Stay (HOSPITAL_BASED_OUTPATIENT_CLINIC_OR_DEPARTMENT_OTHER): Admission: RE | Admit: 2020-05-23 | Payer: BC Managed Care – PPO | Source: Ambulatory Visit

## 2020-05-23 DIAGNOSIS — Z1231 Encounter for screening mammogram for malignant neoplasm of breast: Secondary | ICD-10-CM

## 2020-05-24 ENCOUNTER — Other Ambulatory Visit: Payer: Self-pay | Admitting: Family

## 2020-05-24 DIAGNOSIS — I10 Essential (primary) hypertension: Secondary | ICD-10-CM

## 2020-05-30 ENCOUNTER — Encounter (HOSPITAL_BASED_OUTPATIENT_CLINIC_OR_DEPARTMENT_OTHER): Payer: BC Managed Care – PPO

## 2020-06-12 ENCOUNTER — Other Ambulatory Visit: Payer: Self-pay

## 2020-06-12 ENCOUNTER — Encounter: Payer: Self-pay | Admitting: Nurse Practitioner

## 2020-06-12 ENCOUNTER — Other Ambulatory Visit: Payer: Self-pay | Admitting: Nurse Practitioner

## 2020-06-12 ENCOUNTER — Ambulatory Visit: Payer: BC Managed Care – PPO | Admitting: Nurse Practitioner

## 2020-06-12 VITALS — BP 144/90 | HR 88 | Temp 97.8°F | Ht 61.5 in | Wt 197.8 lb

## 2020-06-12 DIAGNOSIS — I10 Essential (primary) hypertension: Secondary | ICD-10-CM | POA: Diagnosis not present

## 2020-06-12 MED ORDER — POTASSIUM CHLORIDE ER 10 MEQ PO CPCR: 10.0000 meq | ORAL_CAPSULE | Freq: Two times a day (BID) | ORAL | 1 refills | Status: DC

## 2020-06-12 MED ORDER — CHLORTHALIDONE 15 MG PO TABS: 15.0000 mg | ORAL_TABLET | Freq: Every day | ORAL | 1 refills | Status: DC

## 2020-06-12 MED ORDER — AMLODIPINE BESYLATE 5 MG PO TABS: 5.0000 mg | ORAL_TABLET | Freq: Every day | ORAL | 3 refills | Status: DC

## 2020-06-12 NOTE — Progress Notes (Signed)
Subjective:  [name], female    DOB: 1960/12/15  Age: 60 y.o. MRN: [id number]  CC: Follow-up (Pt states she is in need of HTN medication refills.)  HPI  Essential (primary) hypertension Improving BP with amlodipine and chlorthalidone. Reviewed ED notes BP Readings from Last 3 Encounters:  06/12/20 (!) 144/90  04/24/20 (!) 178/102  04/03/20 124/86   Maintain current dose Advised to monitor BP at hone in AM she is to call office if BP persistent >140/90 after 4weeks F/up in 47months  BP Readings from Last 3 Encounters:  06/12/20 (!) 144/90  04/24/20 (!) 178/102  04/03/20 124/86   Reviewed past Medical, Social and Family history today.  Outpatient Medications Prior to Visit  Medication Sig Dispense Refill  . Black Pepper-Turmeric 3-500 MG CAPS Take by mouth.    . DULoxetine (CYMBALTA) 60 MG capsule Take 60 mg by mouth daily.    . ferrous sulfate 325 (65 FE) MG tablet Take 325 mg by mouth daily with breakfast.    . meloxicam (MOBIC) 15 MG tablet TAKE 1 TABLET BY MOUTH EVERY DAY 30 tablet 3  . Turmeric (QC TUMERIC COMPLEX PO)     . amLODipine (NORVASC) 5 MG tablet Take 1 tablet (5 mg total) by mouth daily. 30 tablet 0  . chlorthalidone (HYGROTEN) 15 MG tablet Take 1 tablet (15 mg total) by mouth daily. 90 tablet 1  . potassium chloride SA (KLOR-CON) 20 MEQ tablet Take 1 tablet (20 mEq total) by mouth daily. 90 tablet 1  . RINVOQ 15 MG TB24 Take 1 tablet by mouth daily. (Patient not taking: Reported on 06/12/2020)     No facility-administered medications prior to visit.    ROS See HPI  Objective:  BP (!) 144/90 (BP Location: Left Arm, Patient Position: Sitting, Cuff Size: Large)   Pulse 88   Temp 97.8 F (36.6 C) (Temporal)   Ht 5' 1.5" (1.562 m)   Wt 197 lb 12.8 oz (89.7 kg)   SpO2 97%   BMI 36.77 kg/m   Physical Exam  Assessment & Plan:  This visit occurred during the SARS-CoV-2 public health emergency.  Safety protocols were in place,  including screening questions prior to the visit, additional usage of staff PPE, and extensive cleaning of exam room while observing appropriate contact time as indicated for disinfecting solutions.   Erin Bass was seen today for follow-up.  Diagnoses and all orders for this visit:  Essential hypertension -     chlorthalidone (HYGROTEN) 15 MG tablet; Take 1 tablet (15 mg total) by mouth daily.  Other orders -     potassium chloride (MICRO-K) 10 MEQ CR capsule; Take 1 capsule (10 mEq total) by mouth 2 (two) times daily. -     amLODipine (NORVASC) 5 MG tablet; Take 1 tablet (5 mg total) by mouth daily.    Problem List Items Addressed This Visit      Cardiovascular and Mediastinum   Essential (primary) hypertension - Primary    Improving BP with amlodipine and chlorthalidone. Reviewed ED notes BP Readings from Last 3 Encounters:  06/12/20 (!) 144/90  04/24/20 (!) 178/102  04/03/20 124/86   Maintain current dose Advised to monitor BP at hone in AM she is to call office if BP persistent >140/90 after 4weeks F/up in 78months      Relevant Medications   amLODipine (NORVASC) 5 MG tablet   chlorthalidone (HYGROTEN) 15 MG tablet      Follow-up: Return in about 3 months (around  09/12/2020) for CPE (fasting).  [name], NP

## 2020-06-12 NOTE — Assessment & Plan Note (Signed)
Improving BP with amlodipine and chlorthalidone. Reviewed ED notes BP Readings from Last 3 Encounters:  06/12/20 (!) 144/90  04/24/20 (!) 178/102  04/03/20 124/86   Maintain current dose Advised to monitor BP at hone in AM she is to call office if BP persistent >140/90 after 4weeks F/up in 76months

## 2020-06-12 NOTE — Patient Instructions (Addendum)
Discuss joint pain management with rheumatology  Maintain current BP medications  Call office if BP stays >140/90 after 7month

## 2020-06-19 ENCOUNTER — Other Ambulatory Visit (HOSPITAL_COMMUNITY): Payer: Self-pay | Admitting: Rheumatology

## 2020-06-19 ENCOUNTER — Encounter: Payer: Self-pay | Admitting: Nurse Practitioner

## 2020-06-19 ENCOUNTER — Telehealth: Payer: Self-pay

## 2020-06-19 DIAGNOSIS — M25562 Pain in left knee: Secondary | ICD-10-CM | POA: Diagnosis not present

## 2020-06-19 DIAGNOSIS — M0579 Rheumatoid arthritis with rheumatoid factor of multiple sites without organ or systems involvement: Secondary | ICD-10-CM | POA: Diagnosis not present

## 2020-06-19 DIAGNOSIS — M7989 Other specified soft tissue disorders: Secondary | ICD-10-CM

## 2020-06-19 DIAGNOSIS — R059 Cough, unspecified: Secondary | ICD-10-CM | POA: Diagnosis not present

## 2020-06-19 DIAGNOSIS — M150 Primary generalized (osteo)arthritis: Secondary | ICD-10-CM | POA: Diagnosis not present

## 2020-06-19 DIAGNOSIS — M25561 Pain in right knee: Secondary | ICD-10-CM | POA: Diagnosis not present

## 2020-06-19 NOTE — Telephone Encounter (Signed)
PA for Thalitone 15 mg submitted though cover my meds to [employer].  Awaiting response. Dm/cma    [id number]

## 2020-06-19 NOTE — Telephone Encounter (Signed)
PA approved from 06/19/20 -06/19/21.  Pharmacy notified VIA phone.  Dm/cma

## 2020-06-20 ENCOUNTER — Other Ambulatory Visit: Payer: Self-pay

## 2020-06-20 ENCOUNTER — Ambulatory Visit (HOSPITAL_COMMUNITY)
Admission: RE | Admit: 2020-06-20 | Discharge: 2020-06-20 | Disposition: A | Discharge status: Home / Self Care | Payer: BC Managed Care – PPO | Source: Ambulatory Visit | Attending: Physician Assistant | Admitting: Physician Assistant

## 2020-06-20 DIAGNOSIS — M25562 Pain in left knee: Secondary | ICD-10-CM | POA: Diagnosis not present

## 2020-06-20 DIAGNOSIS — M25561 Pain in right knee: Secondary | ICD-10-CM | POA: Diagnosis not present

## 2020-06-20 DIAGNOSIS — M7989 Other specified soft tissue disorders: Secondary | ICD-10-CM | POA: Diagnosis not present

## 2020-06-24 ENCOUNTER — Other Ambulatory Visit: Payer: Self-pay

## 2020-06-24 ENCOUNTER — Encounter (HOSPITAL_BASED_OUTPATIENT_CLINIC_OR_DEPARTMENT_OTHER): Payer: Self-pay

## 2020-06-24 ENCOUNTER — Ambulatory Visit (HOSPITAL_BASED_OUTPATIENT_CLINIC_OR_DEPARTMENT_OTHER)
Admission: RE | Admit: 2020-06-24 | Discharge: 2020-06-24 | Disposition: A | Discharge status: Home / Self Care | Payer: BC Managed Care – PPO | Source: Ambulatory Visit | Attending: Nurse Practitioner | Admitting: Nurse Practitioner

## 2020-06-24 DIAGNOSIS — Z1231 Encounter for screening mammogram for malignant neoplasm of breast: Secondary | ICD-10-CM | POA: Diagnosis not present

## 2020-08-07 DIAGNOSIS — G8929 Other chronic pain: Secondary | ICD-10-CM | POA: Insufficient documentation

## 2020-08-07 DIAGNOSIS — M25562 Pain in left knee: Secondary | ICD-10-CM | POA: Diagnosis not present

## 2020-08-07 DIAGNOSIS — M25561 Pain in right knee: Secondary | ICD-10-CM | POA: Diagnosis not present

## 2020-08-20 ENCOUNTER — Ambulatory Visit: Payer: BC Managed Care – PPO | Admitting: Family Medicine

## 2020-08-20 ENCOUNTER — Other Ambulatory Visit: Payer: Self-pay

## 2020-08-20 ENCOUNTER — Encounter: Payer: Self-pay | Admitting: Family Medicine

## 2020-08-20 VITALS — BP 136/84 | HR 106 | Temp 97.2°F | Ht 61.0 in | Wt 189.4 lb

## 2020-08-20 DIAGNOSIS — M170 Bilateral primary osteoarthritis of knee: Secondary | ICD-10-CM | POA: Diagnosis not present

## 2020-08-20 DIAGNOSIS — M069 Rheumatoid arthritis, unspecified: Secondary | ICD-10-CM

## 2020-08-20 MED ORDER — PREDNISONE 10 MG (48) PO TBPK: ORAL_TABLET | ORAL | 0 refills | Status: DC

## 2020-08-20 MED ORDER — TAPENTADOL HCL 50 MG PO TABS: ORAL_TABLET | ORAL | 0 refills | Status: DC

## 2020-08-20 NOTE — Progress Notes (Signed)
Established Patient Office Visit  Subjective:  [name], female    DOB: 12/10/1960  Age: 60 y.o. MRN: [id number]  CC:  Chief Complaint  Patient presents with   Pain    C/O pain all over body mostly in knees.     HPI Erin Bass presents for further evaluation and treatment of pain that she has been having in her knees, shoulders, elbows wrists.  History of rheumatoid arthritis.  There is been some difficulty finding a DMARD for her secondary to intolerance issues.  She was recently started on Orencia by her rheumatologist a short while ago and it has not started to work for her.  Ongoing pain and stiffness in above-mentioned joints.  Her history is complicated by severe osteoarthritis in both of her knees.  Recently saw orthopedic PA who told her that she was bone-on-bone in her right knee and needed a replacement.  She will see the orthopedist on the 18th.  She has taken prednisone in the past with good effect.  Past Medical History:  Diagnosis Date   Anemia    Arthritis    HTN (hypertension)     Past Surgical History:  Procedure Laterality Date   ABDOMINAL HYSTERECTOMY     Partial- Uterus only   bone spur - removal     CESAREAN SECTION     x2   PARTIAL HYSTERECTOMY  1/06   fibroids; ovaries left in    TOTAL HIP ARTHROPLASTY Right 12/20/2012   Procedure: RIGHT TOTAL HIP ARTHROPLASTY ANTERIOR APPROACH;  Surgeon: Shelda Pal, MD;  Location: [facility];  Service: Orthopedics;  Laterality: Right;   TUBAL LIGATION      Family History  Problem Relation Age of Onset   Heart disease Father    Anemia Mother    Depression Other        both sides of family     Social History   Socioeconomic History   Marital status: Married    Spouse name: Not on file   Number of children: Not on file   Years of education: Not on file   Highest education level: Not on file  Occupational History   Not on file  Tobacco Use   Smoking status: Never   Smokeless tobacco:  Never  Vaping Use   Vaping Use: Never used  Substance and Sexual Activity   Alcohol use: Yes    Comment: occassionally   Drug use: No   Sexual activity: Not on file  Other Topics Concern   Not on file  Social History Narrative   Married; 2 children, 1 at home.    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   Social Determinants of Health   Financial Resource Strain: Not on file  Food Insecurity: Not on file  Transportation Needs: Not on file  Physical Activity: Not on file  Stress: Not on file  Social Connections: Not on file  Intimate Partner Violence: Not on file    Outpatient Medications Prior to Visit  Medication Sig Dispense Refill   amLODipine (NORVASC) 5 MG tablet Take 1 tablet (5 mg total) by mouth daily. 90 tablet 3   DULoxetine (CYMBALTA) 60 MG capsule Take 60 mg by mouth daily.     ferrous sulfate 325 (65 FE) MG tablet Take 325 mg by mouth daily with breakfast.     meloxicam (MOBIC) 15 MG tablet TAKE 1 TABLET BY MOUTH EVERY DAY 30 tablet 3   ORENCIA  CLICKJECT 125 MG/ML SOAJ Inject into the skin.     potassium chloride (MICRO-K) 10 MEQ CR capsule Take 1 capsule (10 mEq total) by mouth 2 (two) times daily. 180 capsule 1   THALITONE 15 MG tablet TAKE 1 TABLET (15 MG TOTAL) BY MOUTH DAILY. 90 tablet 1   Turmeric (QC TUMERIC COMPLEX PO)      Black Pepper-Turmeric 3-500 MG CAPS Take by mouth.     No facility-administered medications prior to visit.    Allergies  Allergen Reactions   Leflunomide Other (See Comments)    Rash  Incontinence    Adalimumab Swelling   Methotrexate Derivatives Other (See Comments)    Elevated Liver Functions and Bruising   Rinvoq [Upadacitinib] Other (See Comments)    Neck stiffness   Simponi [Golimumab] Rash    ROS Review of Systems  Constitutional:  Positive for activity change. Negative for unexpected weight change.  Respiratory: Negative.    Cardiovascular: Negative.   Gastrointestinal: Negative.   Musculoskeletal:   Positive for arthralgias, gait problem and joint swelling.  Psychiatric/Behavioral: Negative.       Objective:    Physical Exam Vitals and nursing note reviewed.  Constitutional:      Appearance: Normal appearance. She is obese.  HENT:     Right Ear: External ear normal.     Left Ear: External ear normal.  Eyes:     General: No scleral icterus.       Right eye: No discharge.        Left eye: No discharge.     Conjunctiva/sclera: Conjunctivae normal.  Pulmonary:     Effort: Pulmonary effort is normal.  Skin:    General: Skin is warm and dry.  Neurological:     Mental Status: She is alert and oriented to person, place, and time.  Psychiatric:        Mood and Affect: Mood normal.        Behavior: Behavior normal.    BP 136/84 (BP Location: Left Arm, Patient Position: Sitting, Cuff Size: Large)   Pulse (!) 106   Temp (!) 97.2 F (36.2 C) (Temporal)   Ht 5\' 1"  (1.549 m)   Wt 189 lb 6.4 oz (85.9 kg)   SpO2 94%   BMI 35.79 kg/m  Wt Readings from Last 3 Encounters:  08/20/20 189 lb 6.4 oz (85.9 kg)  06/12/20 197 lb 12.8 oz (89.7 kg)  04/24/20 190 lb (86.2 kg)     Health Maintenance Due  Topic Date Due   Zoster Vaccines- Shingrix (1 of 2) Never done   Pneumococcal Vaccine 6-55 Years old (2 - PCV) 10/21/2012   TETANUS/TDAP  04/20/2014   COVID-19 Vaccine (2 - Janssen risk series) 08/31/2019   PAP SMEAR-Modifier  11/13/2019   INFLUENZA VACCINE  08/19/2020    There are no preventive care reminders to display for this patient.  Lab Results  Component Value Date   TSH 1.42 04/04/2019   Lab Results  Component Value Date   WBC 8.1 04/24/2020   HGB 11.7 (L) 04/24/2020   HCT 36.8 04/24/2020   MCV 74.3 (L) 04/24/2020   PLT 271 04/24/2020   Lab Results  Component Value Date   NA 138 04/24/2020   K 3.4 (L) 04/24/2020   CO2 28 04/24/2020   GLUCOSE 103 (H) 04/24/2020   BUN 17 04/24/2020   CREATININE 0.74 04/24/2020   BILITOT 0.3 02/21/2018   ALKPHOS 55 04/04/2019    AST 16 04/04/2019   ALT 12  04/04/2019   PROT 7.6 02/21/2018   ALBUMIN 3.9 04/04/2019   CALCIUM 9.6 04/24/2020   ANIONGAP 9 04/24/2020   GFR 55.34 (L) 04/03/2020   Lab Results  Component Value Date   CHOL 256 (A) 04/04/2019   Lab Results  Component Value Date   HDL 100 (A) 04/04/2019   Lab Results  Component Value Date   LDLCALC 134 04/04/2019   Lab Results  Component Value Date   TRIG 108 04/04/2019   Lab Results  Component Value Date   CHOLHDL 2 09/01/2017   No results found for: HGBA1C    Assessment & Plan:   Problem List Items Addressed This Visit       Musculoskeletal and Integument   Osteoarthritis of both knees - Primary   Relevant Medications   ORENCIA CLICKJECT 125 MG/ML SOAJ   predniSONE (STERAPRED UNI-PAK 48 TAB) 10 MG (48) TBPK tablet   tapentadol (NUCYNTA) 50 MG tablet   Other Visit Diagnoses     Rheumatoid arthritis involving multiple joints (HCC)       Relevant Medications   ORENCIA CLICKJECT 125 MG/ML SOAJ   predniSONE (STERAPRED UNI-PAK 48 TAB) 10 MG (48) TBPK tablet   tapentadol (NUCYNTA) 50 MG tablet       Meds ordered this encounter  Medications   predniSONE (STERAPRED UNI-PAK 48 TAB) 10 MG (48) TBPK tablet    Sig: Pharmacy please instruct 12 day dose pack.    Dispense:  48 tablet    Refill:  0   tapentadol (NUCYNTA) 50 MG tablet    Sig: May take one every 12 hours as needed for pain.    Dispense:  40 tablet    Refill:  0    Follow-up: Return follow up with Claris Gower and with orthopedist on the 18th.    Mliss Sax, MD

## 2020-08-23 ENCOUNTER — Ambulatory Visit: Payer: BC Managed Care – PPO | Admitting: Nurse Practitioner

## 2020-09-09 ENCOUNTER — Other Ambulatory Visit: Payer: Self-pay | Admitting: Nurse Practitioner

## 2020-09-09 DIAGNOSIS — M069 Rheumatoid arthritis, unspecified: Secondary | ICD-10-CM

## 2020-09-11 NOTE — Telephone Encounter (Signed)
Chart supports rx refill Last ov: 08/06/2020 Last refill: 08/20/2020

## 2020-09-12 ENCOUNTER — Encounter: Payer: Self-pay | Admitting: Nurse Practitioner

## 2020-09-12 ENCOUNTER — Ambulatory Visit (INDEPENDENT_AMBULATORY_CARE_PROVIDER_SITE_OTHER): Payer: BC Managed Care – PPO | Admitting: Nurse Practitioner

## 2020-09-12 ENCOUNTER — Other Ambulatory Visit (INDEPENDENT_AMBULATORY_CARE_PROVIDER_SITE_OTHER): Payer: BC Managed Care – PPO

## 2020-09-12 ENCOUNTER — Other Ambulatory Visit: Payer: Self-pay

## 2020-09-12 VITALS — BP 120/86 | HR 90 | Temp 97.7°F | Ht 61.0 in | Wt 190.0 lb

## 2020-09-12 DIAGNOSIS — M170 Bilateral primary osteoarthritis of knee: Secondary | ICD-10-CM | POA: Diagnosis not present

## 2020-09-12 DIAGNOSIS — Z0001 Encounter for general adult medical examination with abnormal findings: Secondary | ICD-10-CM

## 2020-09-12 DIAGNOSIS — E785 Hyperlipidemia, unspecified: Secondary | ICD-10-CM

## 2020-09-12 DIAGNOSIS — R739 Hyperglycemia, unspecified: Secondary | ICD-10-CM | POA: Insufficient documentation

## 2020-09-12 DIAGNOSIS — M069 Rheumatoid arthritis, unspecified: Secondary | ICD-10-CM

## 2020-09-12 DIAGNOSIS — M1712 Unilateral primary osteoarthritis, left knee: Secondary | ICD-10-CM | POA: Diagnosis not present

## 2020-09-12 DIAGNOSIS — Z23 Encounter for immunization: Secondary | ICD-10-CM | POA: Diagnosis not present

## 2020-09-12 DIAGNOSIS — I10 Essential (primary) hypertension: Secondary | ICD-10-CM | POA: Diagnosis not present

## 2020-09-12 DIAGNOSIS — R8290 Unspecified abnormal findings in urine: Secondary | ICD-10-CM

## 2020-09-12 MED ORDER — CHLORTHALIDONE 15 MG PO TABS: 15.0000 mg | ORAL_TABLET | Freq: Every day | ORAL | 1 refills | Status: DC

## 2020-09-12 MED ORDER — POTASSIUM CHLORIDE ER 10 MEQ PO CPCR: 10.0000 meq | ORAL_CAPSULE | Freq: Two times a day (BID) | ORAL | 1 refills | Status: DC

## 2020-09-12 MED ORDER — MELOXICAM 15 MG PO TABS: 15.0000 mg | ORAL_TABLET | Freq: Every day | ORAL | 3 refills | Status: DC

## 2020-09-12 MED ORDER — DULOXETINE HCL 60 MG PO CPEP: 60.0000 mg | ORAL_CAPSULE | Freq: Every day | ORAL | 3 refills | Status: DC

## 2020-09-12 MED ORDER — AMLODIPINE BESYLATE 5 MG PO TABS: 5.0000 mg | ORAL_TABLET | Freq: Every day | ORAL | 3 refills | Status: DC

## 2020-09-12 NOTE — Patient Instructions (Signed)
Schedule appt for fasting labs this afternoon  Preventive Care 60-60 Years Old, Female Preventive care refers to lifestyle choices and visits with your health care provider that can promote health and wellness. This includes: A yearly physical exam. This is also called an annual wellness visit. Regular dental and eye exams. Immunizations. Screening for certain conditions. Healthy lifestyle choices, such as: Eating a healthy diet. Getting regular exercise. Not using drugs or products that contain nicotine and tobacco. Limiting alcohol use. What can I expect for my preventive care visit? Physical exam Your health care provider will check your: Height and weight. These may be used to calculate your BMI (body mass index). BMI is a measurement that tells if you are at a healthy weight. Heart rate and blood pressure. Body temperature. Skin for abnormal spots. Counseling Your health care provider may ask you questions about your: Past medical problems. Family's medical history. Alcohol, tobacco, and drug use. Emotional well-being. Home life and relationship well-being. Sexual activity. Diet, exercise, and sleep habits. Work and work Statistician. Access to firearms. Method of birth control. Menstrual cycle. Pregnancy history. What immunizations do I need?  Vaccines are usually given at various ages, according to a schedule. Your health care provider will recommend vaccines for you based on your age, medicalhistory, and lifestyle or other factors, such as travel or where you work. What tests do I need? Blood tests Lipid and cholesterol levels. These may be checked every 5 years, or more often if you are over 60 years old. Hepatitis C test. Hepatitis B test. Screening Lung cancer screening. You may have this screening every year starting at age 60 if you have a 30-pack-year history of smoking and currently smoke or have quit within the past 15 years. Colorectal cancer  screening. All adults should have this screening starting at age 60 and continuing until age 60. Your health care provider may recommend screening at age 44 if you are at increased risk. You will have tests every 1-10 years, depending on your results and the type of screening test. Diabetes screening. This is done by checking your blood sugar (glucose) after you have not eaten for a while (fasting). You may have this done every 1-3 years. Mammogram. This may be done every 1-2 years. Talk with your health care provider about when you should start having regular mammograms. This may depend on whether you have a family history of breast cancer. BRCA-related cancer screening. This may be done if you have a family history of breast, ovarian, tubal, or peritoneal cancers. Pelvic exam and Pap test. This may be done every 3 years starting at age 60. Starting at age 60, this may be done every 5 years if you have a Pap test in combination with an HPV test. Other tests STD (sexually transmitted disease) testing, if you are at risk. Bone density scan. This is done to screen for osteoporosis. You may have this scan if you are at high risk for osteoporosis. Talk with your health care provider about your test results, treatment options,and if necessary, the need for more tests. Follow these instructions at home: Eating and drinking  Eat a diet that includes fresh fruits and vegetables, whole grains, lean protein, and low-fat dairy products. Take vitamin and mineral supplements as recommended by your health care provider. Do not drink alcohol if: Your health care provider tells you not to drink. You are pregnant, may be pregnant, or are planning to become pregnant. If you drink alcohol: Limit how much you  have to 0-1 drink a day. Be aware of how much alcohol is in your drink. In the [place] one drink equals one 12 oz bottle of beer (355 mL), one 5 oz glass of wine (148 mL), or one 1 oz glass of hard  liquor (44 mL).  Lifestyle Take daily care of your teeth and gums. Brush your teeth every morning and night with fluoride toothpaste. Floss one time each day. Stay active. Exercise for at least 30 minutes 5 or more days each week. Do not use any products that contain nicotine or tobacco, such as cigarettes, e-cigarettes, and chewing tobacco. If you need help quitting, ask your health care provider. Do not use drugs. If you are sexually active, practice safe sex. Use a condom or other form of protection to prevent STIs (sexually transmitted infections). If you do not wish to become pregnant, use a form of birth control. If you plan to become pregnant, see your health care provider for a prepregnancy visit. If told by your health care provider, take low-dose aspirin daily starting at age 60. Find healthy ways to cope with stress, such as: Meditation, yoga, or listening to music. Journaling. Talking to a trusted person. Spending time with friends and family. Safety Always wear your seat belt while driving or riding in a vehicle. Do not drive: If you have been drinking alcohol. Do not ride with someone who has been drinking. When you are tired or distracted. While texting. Wear a helmet and other protective equipment during sports activities. If you have firearms in your house, make sure you follow all gun safety procedures. What's next? Visit your health care provider once a year for an annual wellness visit. Ask your health care provider how often you should have your eyes and teeth checked. Stay up to date on all vaccines. This information is not intended to replace advice given to you by your health care provider. Make sure you discuss any questions you have with your healthcare provider. Document Revised: 10/10/2019 Document Reviewed: 09/16/2017 Elsevier Patient Education  2022 [employer].

## 2020-09-12 NOTE — Assessment & Plan Note (Signed)
Repeat hgbA1c 

## 2020-09-12 NOTE — Assessment & Plan Note (Signed)
BP at goal BP Readings from Last 3 Encounters:  09/12/20 120/86  08/20/20 136/84  06/12/20 (!) 144/90

## 2020-09-12 NOTE — Progress Notes (Signed)
Subjective:    [name], female    DOB: Jun 15, 1960, 60 y.o.   MRN: [id number]  Patient presents today for CPE and eval of chronic conditions  HPI Essential (primary) hypertension BP at goal BP Readings from Last 3 Encounters:  09/12/20 120/86  08/20/20 136/84  06/12/20 (!) 144/90    HLD (hyperlipidemia) Repeat lipid panel  Hyperglycemia Repeat hgbA1c  Vision:up to date Dental:will schedule Diet:heart healthy Exercise:limited due to chronic joint pain Weight:  Wt Readings from Last 3 Encounters:  09/12/20 190 lb (86.2 kg)  08/20/20 189 lb 6.4 oz (85.9 kg)  06/12/20 197 lb 12.8 oz (89.7 kg)    Sexual History (orientation,birth control, marital status, STD):s/p hysterectomy, no hx of abnormal and no Fhx of cervical or ovarian cancer.ok to discontinue PAP smear Will schedule mammogram.  Depression/Suicide: Depression screen [facility] 2/9 09/12/2020 08/20/2020 06/14/2019 02/21/2018 11/15/2017 11/12/2016  Decreased Interest 0 1 0 0 0 0  Down, Depressed, Hopeless 0 0 0 0 0 0  PHQ - 2 Score 0 1 0 0 0 0  Altered sleeping 0 - - - - -  Tired, decreased energy 1 - - - - -  Change in appetite 0 - - - - -  Feeling bad or failure about yourself  0 - - - - -  Trouble concentrating 0 - - - - -  Moving slowly or fidgety/restless 0 - - - - -  Suicidal thoughts 0 - - - - -  PHQ-9 Score 1 - - - - -  Difficult doing work/chores Not difficult at all - - - - -   Immunizations: (TDAP, Hep C screen, Pneumovax, Influenza, zoster)  Health Maintenance  Topic Date Due   Flu Shot  08/19/2020   COVID-19 Vaccine (2 - Janssen risk series) 09/28/2020*   Zoster (Shingles) Vaccine (1 of 2) 12/13/2020*   Pneumococcal Vaccination (2 - PCV) 09/12/2021*   Tetanus Vaccine  09/12/2021*   Mammogram  06/25/2022   Colon Cancer Screening  09/01/2023   HIV Screening  Completed   HPV Vaccine  Aged Out   Pap Smear  Discontinued   Hepatitis C Screening: USPSTF Recommendation to screen - Ages 8-79  yo.  Discontinued  *Topic was postponed. The date shown is not the original due date.   Fall Risk: Fall Risk  08/20/2020 02/21/2018 11/12/2016  Falls in the past year? 0 0 No  Follow up - Falls evaluation completed -   Medications and allergies reviewed with patient and updated if appropriate.  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     ORENCIA CLICKJECT 125 MG/ML SOAJ Inject into the skin.     Turmeric (QC TUMERIC COMPLEX PO)      No current facility-administered medications on file prior to visit.    Past Medical History:  Diagnosis Date   Anemia    Arthritis    HTN (hypertension)     Past Surgical History:  Procedure Laterality Date   ABDOMINAL HYSTERECTOMY     Partial- Uterus only   bone spur - removal     CESAREAN SECTION     x2   PARTIAL HYSTERECTOMY  1/06   fibroids; ovaries left in    TOTAL HIP ARTHROPLASTY Right 12/20/2012   Procedure: RIGHT TOTAL HIP ARTHROPLASTY ANTERIOR APPROACH;  Surgeon: Shelda Pal, MD;  Location: [facility];  Service: Orthopedics;  Laterality: Right;   TUBAL LIGATION      Social History   Socioeconomic History   Marital status: Married    Spouse name: Not on file   Number of children: Not on file   Years of education: Not on file   Highest education level: Not on file   Occupational History   Not on file  Tobacco Use   Smoking status: Never   Smokeless tobacco: Never  Vaping Use   Vaping Use: Never used  Substance and Sexual Activity   Alcohol use: Yes    Comment: occassionally   Drug use: No   Sexual activity: Not Currently  Other Topics Concern   Not on file  Social History Narrative   Married; 2 children, 1 at home.    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   Social Determinants of Health   Financial Resource Strain: Not on file  Food Insecurity: Not on file  Transportation Needs: Not on file  Physical Activity: Not on file  Stress: Not on file  Social Connections: Not on file   Family History  Problem Relation Age of Onset   Heart disease Father    Anemia Mother    Depression Other        both sides of family        Review of Systems  Constitutional:  Negative for fever, malaise/fatigue and weight loss.  HENT:  Negative for congestion and sore throat.   Eyes:        Negative for visual changes  Respiratory:  Negative for cough and shortness of breath.   Cardiovascular:  Negative for chest pain, palpitations and leg swelling.  Gastrointestinal:  Negative for blood in stool, constipation, diarrhea and heartburn.  Genitourinary:  Negative for dysuria, frequency and urgency.  Musculoskeletal:  Negative for falls, joint pain and myalgias.  Skin:  Negative for rash.  Neurological:  Negative for dizziness, sensory change and headaches.  Endo/Heme/Allergies:  Does not bruise/bleed easily.  Psychiatric/Behavioral:  Negative for depression, substance abuse and suicidal ideas. The patient is not nervous/anxious.    Objective:   Vitals:   09/12/20 0818  BP: 120/86  Pulse: 90  Temp: 97.7 F (36.5 C)  SpO2: 98%   Body mass index is 35.9 kg/m.  Physical Examination:  Physical Exam Vitals reviewed.  Constitutional:      General: She is not in acute distress.    Appearance: She is well-developed.  HENT:      Right Ear: Tympanic membrane, ear canal and external ear normal.     Left Ear: Tympanic membrane, ear canal and external ear normal.  Eyes:     Extraocular Movements: Extraocular movements intact.     Conjunctiva/sclera: Conjunctivae normal.  Cardiovascular:     Rate and Rhythm: Normal rate and regular rhythm.     Pulses: Normal pulses.     Heart sounds: Normal heart sounds.  Pulmonary:  Effort: Pulmonary effort is normal. No respiratory distress.     Breath sounds: Normal breath sounds.  Chest:     Chest wall: No tenderness.  Abdominal:     General: Bowel sounds are normal.     Palpations: Abdomen is soft.  Musculoskeletal:        General: Normal range of motion.     Cervical back: Normal range of motion and neck supple.     Right lower leg: No edema.     Left lower leg: No edema.  Lymphadenopathy:     Cervical: No cervical adenopathy.  Skin:    General: Skin is warm and dry.  Neurological:     Mental Status: She is alert and oriented to person, place, and time.     Deep Tendon Reflexes: Reflexes are normal and symmetric.  Psychiatric:        Mood and Affect: Mood normal.        Behavior: Behavior normal.        Thought Content: Thought content normal.   ASSESSMENT and PLAN: This visit occurred during the SARS-CoV-2 public health emergency.  Safety protocols were in place, including screening questions prior to the visit, additional usage of staff PPE, and extensive cleaning of exam room while observing appropriate contact time as indicated for disinfecting solutions.   Giovanna was seen today for annual exam.  Diagnoses and all orders for this visit:  Encounter for preventative adult health care exam with abnormal findings -     Cancel: Comprehensive metabolic panel -     Cancel: CBC with Differential/Platelet -     Cancel: TSH -     Cancel: Urinalysis w microscopic + reflex cultur -     Urinalysis w microscopic + reflex cultur; Future -     Comprehensive metabolic  panel; Future -     CBC with Differential/Platelet; Future -     TSH; Future  Essential (primary) hypertension -     amLODipine (NORVASC) 5 MG tablet; Take 1 tablet (5 mg total) by mouth daily. -     chlorthalidone (THALITONE) 15 MG tablet; Take 1 tablet (15 mg total) by mouth daily. -     potassium chloride (MICRO-K) 10 MEQ CR capsule; Take 1 capsule (10 mEq total) by mouth 2 (two) times daily.  Hyperlipidemia, unspecified hyperlipidemia type -     Cancel: Lipid panel -     Lipid panel; Future  Hyperglycemia -     Cancel: Hemoglobin A1c -     Hemoglobin A1c; Future  Rheumatoid arthritis involving multiple joints (HCC) -     DULoxetine (CYMBALTA) 60 MG capsule; Take 1 capsule (60 mg total) by mouth daily. -     meloxicam (MOBIC) 15 MG tablet; Take 1 tablet (15 mg total) by mouth daily. With food  Need for pneumococcal vaccine -     Pneumococcal conjugate vaccine 20-valent (Prevnar 20)     Problem List Items Addressed This Visit       Cardiovascular and Mediastinum   Essential (primary) hypertension    BP at goal BP Readings from Last 3 Encounters:  09/12/20 120/86  08/20/20 136/84  06/12/20 (!) 144/90        Relevant Medications   amLODipine (NORVASC) 5 MG tablet   chlorthalidone (THALITONE) 15 MG tablet   potassium chloride (MICRO-K) 10 MEQ CR capsule     Other   HLD (hyperlipidemia)    Repeat lipid panel      Relevant Medications   amLODipine (  NORVASC) 5 MG tablet   chlorthalidone (THALITONE) 15 MG tablet   Other Relevant Orders   Lipid panel   Hyperglycemia    Repeat hgbA1c      Relevant Orders   Hemoglobin A1c   Other Visit Diagnoses     Encounter for preventative adult health care exam with abnormal findings    -  Primary   Relevant Orders   Urinalysis w microscopic + reflex cultur   Comprehensive metabolic panel   CBC with Differential/Platelet   TSH   Rheumatoid arthritis involving multiple joints (HCC)       Relevant Medications    DULoxetine (CYMBALTA) 60 MG capsule   meloxicam (MOBIC) 15 MG tablet   Need for pneumococcal vaccine       Relevant Orders   Pneumococcal conjugate vaccine 20-valent (Prevnar 20) (Completed)        Follow up: Return in about 6 months (around 03/15/2021) for HTN and, hyperlipidemia (fatsing).  [name], NP

## 2020-09-12 NOTE — Assessment & Plan Note (Signed)
Repeat lipid panel ?

## 2020-09-13 LAB — CBC WITH DIFFERENTIAL/PLATELET
Basophils Absolute: 0.1 10*3/uL (ref 0.0–0.1)
Basophils Relative: 0.9 % (ref 0.0–3.0)
Eosinophils Absolute: 0.4 10*3/uL (ref 0.0–0.7)
Eosinophils Relative: 4.1 % (ref 0.0–5.0)
HCT: 34.5 % — ABNORMAL LOW (ref 36.0–46.0)
Hemoglobin: 10.7 g/dL — ABNORMAL LOW (ref 12.0–15.0)
Lymphocytes Relative: 18.9 % (ref 12.0–46.0)
Lymphs Abs: 1.8 10*3/uL (ref 0.7–4.0)
MCHC: 30.9 g/dL (ref 30.0–36.0)
MCV: 75.1 fl — ABNORMAL LOW (ref 78.0–100.0)
Monocytes Absolute: 0.7 10*3/uL (ref 0.1–1.0)
Monocytes Relative: 7.9 % (ref 3.0–12.0)
Neutro Abs: 6.3 10*3/uL (ref 1.4–7.7)
Neutrophils Relative %: 68.2 % (ref 43.0–77.0)
Platelets: 154 10*3/uL (ref 150.0–400.0)
RBC: 4.59 Mil/uL (ref 3.87–5.11)
RDW: 17.6 % — ABNORMAL HIGH (ref 11.5–15.5)
WBC: 9.3 10*3/uL (ref 4.0–10.5)

## 2020-09-13 LAB — LIPID PANEL
Cholesterol: 289 mg/dL — ABNORMAL HIGH (ref 0–200)
HDL: 91.9 mg/dL (ref >39.00)
LDL Cholesterol: 180 mg/dL — ABNORMAL HIGH (ref 0–99)
NonHDL: 197.09
Total CHOL/HDL Ratio: 3
Triglycerides: 83 mg/dL (ref 0.0–149.0)
VLDL: 16.6 mg/dL (ref 0.0–40.0)

## 2020-09-13 LAB — COMPREHENSIVE METABOLIC PANEL
ALT: 12 U/L (ref 0–35)
AST: 15 U/L (ref 0–37)
Albumin: 4.1 g/dL (ref 3.5–5.2)
Alkaline Phosphatase: 68 U/L (ref 39–117)
BUN: 18 mg/dL (ref 6–23)
CO2: 27 mEq/L (ref 19–32)
Calcium: 9.7 mg/dL (ref 8.4–10.5)
Chloride: 98 mEq/L (ref 96–112)
Creatinine, Ser: 0.82 mg/dL (ref 0.40–1.20)
GFR: 77.62 mL/min (ref >60.00)
Glucose, Bld: 80 mg/dL (ref 70–99)
Potassium: 3.2 mEq/L — ABNORMAL LOW (ref 3.5–5.1)
Sodium: 138 mEq/L (ref 135–145)
Total Bilirubin: 0.4 mg/dL (ref 0.2–1.2)
Total Protein: 7.2 g/dL (ref 6.0–8.3)

## 2020-09-13 LAB — TSH: TSH: 0.94 u[IU]/mL (ref 0.35–5.50)

## 2020-09-13 LAB — HEMOGLOBIN A1C: Hgb A1c MFr Bld: 5.8 % (ref 4.6–6.5)

## 2020-09-15 LAB — URINE CULTURE
MICRO NUMBER:: 12296284
SPECIMEN QUALITY:: ADEQUATE

## 2020-09-15 LAB — URINALYSIS W MICROSCOPIC + REFLEX CULTURE
Bilirubin Urine: NEGATIVE
Glucose, UA: NEGATIVE
Hgb urine dipstick: NEGATIVE
Hyaline Cast: NONE SEEN /LPF
Nitrites, Initial: NEGATIVE
Protein, ur: NEGATIVE
Specific Gravity, Urine: 1.024 (ref 1.001–1.035)
pH: 6 (ref 5.0–8.0)

## 2020-09-15 LAB — CULTURE INDICATED

## 2020-09-18 NOTE — Addendum Note (Signed)
Addended by: [name] L on: 09/18/2020 02:40 PM   Modules accepted: Orders

## 2020-09-20 ENCOUNTER — Other Ambulatory Visit: Payer: Self-pay

## 2020-09-20 ENCOUNTER — Telehealth: Payer: Self-pay | Admitting: Nurse Practitioner

## 2020-09-20 ENCOUNTER — Other Ambulatory Visit: Payer: BC Managed Care – PPO

## 2020-09-20 DIAGNOSIS — R8290 Unspecified abnormal findings in urine: Secondary | ICD-10-CM | POA: Diagnosis not present

## 2020-09-20 DIAGNOSIS — N3000 Acute cystitis without hematuria: Secondary | ICD-10-CM

## 2020-09-20 NOTE — Telephone Encounter (Signed)
Pt dropped off paperwork to be filled out by [facility] for her upcoming surgery. She is wanted them faxed off when completed. Please advise pt if any questions.

## 2020-09-20 NOTE — Progress Notes (Signed)
Per the orders of NP [name] pt is here to provide urine sample, sample was adequate amount.

## 2020-09-23 LAB — URINALYSIS W MICROSCOPIC + REFLEX CULTURE
Bacteria, UA: NONE SEEN /HPF
Bilirubin Urine: NEGATIVE
Glucose, UA: NEGATIVE
Hgb urine dipstick: NEGATIVE
Hyaline Cast: NONE SEEN /LPF
Ketones, ur: NEGATIVE
Nitrites, Initial: NEGATIVE
Protein, ur: NEGATIVE
RBC / HPF: NONE SEEN /HPF (ref 0–2)
Specific Gravity, Urine: 1.023 (ref 1.001–1.035)
pH: 6 (ref 5.0–8.0)

## 2020-09-23 LAB — URINE CULTURE
MICRO NUMBER:: 12330890
SPECIMEN QUALITY:: ADEQUATE

## 2020-09-23 LAB — CULTURE INDICATED

## 2020-09-23 MED ORDER — SULFAMETHOXAZOLE-TRIMETHOPRIM 800-160 MG PO TABS
1.0000 | ORAL_TABLET | Freq: Two times a day (BID) | ORAL | 0 refills | Status: DC
Start: 2020-09-23 — End: 2021-01-31

## 2020-09-23 NOTE — Addendum Note (Signed)
Addended by: [name] L on: 09/23/2020 10:51 AM   Modules accepted: Orders

## 2020-09-30 ENCOUNTER — Other Ambulatory Visit (INDEPENDENT_AMBULATORY_CARE_PROVIDER_SITE_OTHER): Payer: BC Managed Care – PPO

## 2020-09-30 ENCOUNTER — Other Ambulatory Visit: Payer: Self-pay

## 2020-09-30 DIAGNOSIS — Z01818 Encounter for other preprocedural examination: Secondary | ICD-10-CM

## 2020-09-30 LAB — PROTIME-INR
INR: 1 ratio (ref 0.8–1.0)
Prothrombin Time: 10.7 s (ref 9.6–13.1)

## 2020-09-30 NOTE — Telephone Encounter (Signed)
Both sets of forms have been completed and one is pending lab work. Pt notified today during her lab appointment.

## 2020-09-30 NOTE — Addendum Note (Signed)
Addended by: Varney Biles on: 09/30/2020 08:31 AM   Modules accepted: Orders

## 2020-10-15 DIAGNOSIS — G8918 Other acute postprocedural pain: Secondary | ICD-10-CM | POA: Diagnosis not present

## 2020-10-15 DIAGNOSIS — M1711 Unilateral primary osteoarthritis, right knee: Secondary | ICD-10-CM | POA: Diagnosis not present

## 2020-11-08 ENCOUNTER — Other Ambulatory Visit: Payer: Self-pay | Admitting: Nurse Practitioner

## 2020-11-08 DIAGNOSIS — E876 Hypokalemia: Secondary | ICD-10-CM

## 2020-11-08 DIAGNOSIS — I10 Essential (primary) hypertension: Secondary | ICD-10-CM

## 2020-11-20 DIAGNOSIS — M25552 Pain in left hip: Secondary | ICD-10-CM | POA: Insufficient documentation

## 2020-11-27 DIAGNOSIS — M25552 Pain in left hip: Secondary | ICD-10-CM | POA: Diagnosis not present

## 2020-12-16 ENCOUNTER — Encounter (HOSPITAL_BASED_OUTPATIENT_CLINIC_OR_DEPARTMENT_OTHER): Payer: Self-pay | Admitting: *Deleted

## 2020-12-16 ENCOUNTER — Emergency Department (HOSPITAL_BASED_OUTPATIENT_CLINIC_OR_DEPARTMENT_OTHER): Payer: BC Managed Care – PPO

## 2020-12-16 ENCOUNTER — Other Ambulatory Visit: Payer: Self-pay

## 2020-12-16 ENCOUNTER — Emergency Department (HOSPITAL_BASED_OUTPATIENT_CLINIC_OR_DEPARTMENT_OTHER)
Admission: EM | Admit: 2020-12-16 | Discharge: 2020-12-16 | Disposition: A | Discharge status: Home / Self Care | Payer: BC Managed Care – PPO | Attending: Emergency Medicine | Admitting: Emergency Medicine

## 2020-12-16 DIAGNOSIS — Z79899 Other long term (current) drug therapy: Secondary | ICD-10-CM | POA: Diagnosis not present

## 2020-12-16 DIAGNOSIS — R0981 Nasal congestion: Secondary | ICD-10-CM | POA: Insufficient documentation

## 2020-12-16 DIAGNOSIS — Z96641 Presence of right artificial hip joint: Secondary | ICD-10-CM | POA: Insufficient documentation

## 2020-12-16 DIAGNOSIS — R059 Cough, unspecified: Secondary | ICD-10-CM | POA: Diagnosis not present

## 2020-12-16 DIAGNOSIS — Z20822 Contact with and (suspected) exposure to covid-19: Secondary | ICD-10-CM | POA: Diagnosis not present

## 2020-12-16 DIAGNOSIS — I10 Essential (primary) hypertension: Secondary | ICD-10-CM | POA: Insufficient documentation

## 2020-12-16 DIAGNOSIS — R052 Subacute cough: Secondary | ICD-10-CM | POA: Diagnosis not present

## 2020-12-16 DIAGNOSIS — R5383 Other fatigue: Secondary | ICD-10-CM | POA: Insufficient documentation

## 2020-12-16 LAB — RESP PANEL BY RT-PCR (FLU A&B, COVID) ARPGX2
Influenza A by PCR: NEGATIVE
Influenza B by PCR: NEGATIVE
SARS Coronavirus 2 by RT PCR: NEGATIVE

## 2020-12-16 MED ORDER — BENZONATATE 100 MG PO CAPS: 100.0000 mg | ORAL_CAPSULE | Freq: Three times a day (TID) | ORAL | 0 refills | Status: DC

## 2020-12-16 MED ORDER — ALBUTEROL SULFATE HFA 108 (90 BASE) MCG/ACT IN AERS: 1.0000 | INHALATION_SPRAY | Freq: Four times a day (QID) | RESPIRATORY_TRACT | 0 refills | Status: DC | PRN

## 2020-12-16 MED ORDER — AZITHROMYCIN 250 MG PO TABS: 250.0000 mg | ORAL_TABLET | Freq: Every day | ORAL | 0 refills | Status: DC

## 2020-12-16 NOTE — ED Triage Notes (Signed)
C/o productive cough for 2 weeks, no relief with coricidin. Also fatigue. Denies pain, fever, sob, NVD.

## 2020-12-16 NOTE — Discharge Instructions (Addendum)
Seen here today for evaluation of your chronic cough.  Your COVID and flu was negative.  Your chest x-ray was normal.  This is likely an atypical pneumonia.  You have been prescribed azithromycin, an antibiotic, to take as prescribed and to complete the course in its entirety.  Additionally, you have been prescribed albuterol inhaler and Tessalon Perles.  These can be taken as needed for symptoms.  I also recommend taking Robitussin-DM over-the-counter if you not finding relief with Tessalon Perles.  If you have any concern, new or worsening symptoms, please return to the nearest emergency department for reevaluation.

## 2020-12-16 NOTE — ED Provider Notes (Signed)
[facility] EMERGENCY DEPARTMENT Provider Note   CSN: [id number] Arrival date & time: 12/16/20  4656     History Chief Complaint  Patient presents with   Cough    Erin Bass is a 60 y.o. female with history of rheumatoid arthritis and hypertension presents to the emergency department for 2 and half weeks of productive cough and fatigue.  Patient reports she had similar symptoms this throughout June and July that gradually improved with time.  Denies any fever, nasal congestion, rhinorrhea, ear pain, sore throat, chest pain, shortness of breath, abdominal pain, nausea, or vomiting.  The patient reports that this all started with a cold that she got from her grandson and has had a lingering productive cough since.  The patient has tried NyQuil with out relief.  Daily medications include amlodipine and hydrochlorothiazide.  Denies any smoking, EtOH, or illicit drug use.   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   Rheumatoid arthritis (HCC) 10/22/2011   Anemia 05/28/2011   POSTMENOPAUSAL STATUS 03/28/2009   HLD (hyperlipidemia) 01/24/2008   Essential (primary) hypertension 09/06/2006    Past Surgical History:  Procedure Laterality Date   ABDOMINAL HYSTERECTOMY     Partial- Uterus only   bone spur - removal     CESAREAN SECTION     x2   PARTIAL HYSTERECTOMY  01/20/2004   fibroids; ovaries left  in    REPLACEMENT TOTAL KNEE Right    TOTAL HIP ARTHROPLASTY Right 12/20/2012   Procedure: RIGHT TOTAL HIP ARTHROPLASTY ANTERIOR APPROACH;  Surgeon: Shelda Pal, MD;  Location: [facility];  Service: Orthopedics;  Laterality: Right;   TUBAL LIGATION       OB History   No obstetric history on file.     Family History  Problem Relation Age of Onset   Heart disease Father    Anemia Mother    Depression Other        both sides of family     Social History   Tobacco Use   Smoking status: Never   Smokeless tobacco: Never  Vaping Use   Vaping Use: Never used  Substance Use Topics   Alcohol use: Yes    Comment: occassionally   Drug use: No    Home Medications Prior to Admission medications   Medication Sig Start Date End Date Taking? Authorizing Provider  albuterol (VENTOLIN HFA) 108 (90 Base) MCG/ACT inhaler Inhale 1-2 puffs into the lungs every 6 (six) hours as needed for wheezing or shortness of breath. 12/16/20  Yes Achille Rich, PA-C  amLODipine (NORVASC) 5 MG tablet Take 1 tablet (5 mg total) by mouth daily. 09/12/20  Yes Nche, Bonna Gains, NP  azithromycin (ZITHROMAX) 250 MG tablet Take 1 tablet (250 mg total) by mouth daily. Take first 2 tablets together, then 1 every day until finished. 12/16/20  Yes Alison Murray,  Victory Dakin, PA-C  benzonatate (TESSALON) 100 MG capsule Take 1 capsule (100 mg total) by mouth every 8 (eight) hours. 12/16/20  Yes Achille Rich, PA-C  chlorthalidone (THALITONE) 15 MG tablet Take 1 tablet (15 mg total) by mouth daily. 09/12/20  Yes Nche, Bonna Gains, NP  DULoxetine (CYMBALTA) 60 MG capsule Take 1 capsule (60 mg total) by mouth daily. 09/12/20  Yes Nche, Bonna Gains, NP  ferrous sulfate 325 (65 FE) MG tablet Take 325 mg by mouth daily with breakfast.   Yes [provider]  meloxicam (MOBIC) 15 MG tablet Take 1 tablet (15 mg total) by mouth daily. With food 09/12/20  Yes Nche, Bonna Gains, NP  potassium chloride (MICRO-K) 10 MEQ CR capsule Take  1 capsule (10 mEq total) by mouth 2 (two) times daily. 09/12/20  Yes Nche, Bonna Gains, NP  Turmeric (QC TUMERIC COMPLEX PO)    Yes [provider]  ORENCIA CLICKJECT 125 MG/ML SOAJ Inject into the skin. 08/08/20   [provider]  sulfamethoxazole-trimethoprim (BACTRIM DS) 800-160 MG tablet Take 1 tablet by mouth 2 (two) times daily. 09/23/20   Nche, Bonna Gains, NP    Allergies    Leflunomide, Adalimumab, Methotrexate derivatives, Rinvoq [upadacitinib], and Simponi [golimumab]  Review of Systems   Review of Systems  Constitutional:  Positive for fatigue. Negative for chills and fever.  HENT:  Negative for congestion, ear pain, rhinorrhea, sinus pressure, sinus pain and sore throat.   Respiratory:  Positive for cough. Negative for shortness of breath and wheezing.   Cardiovascular:  Negative for chest pain and palpitations.  Gastrointestinal:  Negative for abdominal pain, diarrhea, nausea and vomiting.  Neurological:  Negative for dizziness, weakness, light-headedness and headaches.  All other systems reviewed and are negative.  Physical Exam Updated Vital Signs BP (!) 149/103 (BP Location: Left Arm)   Pulse 92   Temp 99.1 F (37.3 C) (Oral)   Resp 20   SpO2 100%   Physical Exam Vitals and nursing note reviewed.  Constitutional:      General: She is not in acute distress.    Appearance: Normal appearance. She is not toxic-appearing.  HENT:     Head: Normocephalic and atraumatic.     Right Ear: Tympanic membrane, ear canal and external ear normal.     Left Ear: Tympanic membrane, ear canal and external ear normal.     Nose: Congestion present.     Mouth/Throat:     Mouth: Mucous membranes are moist.     Pharynx: No oropharyngeal exudate or posterior oropharyngeal erythema.  Eyes:     General: No scleral icterus. Cardiovascular:     Rate and Rhythm: Normal rate and regular rhythm.  Pulmonary:     Effort: Pulmonary effort is normal. No respiratory  distress.     Breath sounds: Normal breath sounds.     Comments: Clear to auscultation bilaterally.  No respiratory distress, accessory muscle use, tripoding, nasal flaring, or cyanosis present.  Patient speaking in full sentences with ease. Abdominal:     General: Abdomen is flat. Bowel sounds are normal.     Palpations: Abdomen is soft.     Tenderness: There is no guarding or rebound.  Musculoskeletal:        General: No deformity.     Cervical back: Normal range of motion.  Skin:    General: Skin is warm and dry.  Neurological:     General: No focal deficit present.     Mental Status: She is alert.  Mental status is at baseline.    ED Results / Procedures / Treatments   Labs (all labs ordered are listed, but only abnormal results are displayed) Labs Reviewed  RESP PANEL BY RT-PCR (FLU A&B, COVID) ARPGX2    EKG None  Radiology DG Chest 2 View  Result Date: 12/16/2020 CLINICAL DATA:  Productive cough > 2 weeks, temp 99.2 EXAM: CHEST - 2 VIEW COMPARISON:  2014 FINDINGS: The heart size and mediastinal contours are within normal limits. Both lungs are clear. No pleural effusion. The visualized skeletal structures are unremarkable. IMPRESSION: No active cardiopulmonary disease. Electronically Signed   By: Guadlupe Spanish M.D.   On: 12/16/2020 10:58    Procedures Procedures   Medications Ordered in ED Medications - No data to display  ED Course  I have reviewed the triage vital signs and the nursing notes.  Pertinent labs & imaging results that were available during my care of the patient were reviewed by me and considered in my medical decision making (see chart for details).  60 year old female presents with productive cough for the past 2 and half weeks.  Differential diagnosis includes but is not limited to pneumonia, pneumothorax, viral illness, COVID, flu.  Patient has a history of tuberculosis in 2012 when she was on methotrexate and reports she took 9 months of  medication.  Patient is not on any immune compromising medications currently.  Low suspicion for TB.  Chest x-ray shows no active cardiopulmonary disease. No pneumothorax or pneumonia seen on CXR.  Lungs are clear to auscultation on exam.  Patient is satting 100% on room air.  Afebrile.  Mildly hypertensive, the patient known hypertension.  Given the duration and chronicity of her cough, will prescribe patient an azithromycin pack for atypical pneumonia coverage. Additionally, she is prescribed tessalon pearls and an albuterol inhaler.  I advised her to follow-up with her primary care provider with her elevated blood pressure reading.  Strict return precautions given.  Patient agrees to plan.  Patient is stable being discharged home in good condition.   MDM Rules/Calculators/A&P                          Final Clinical Impression(s) / ED Diagnoses Final diagnoses:  Subacute cough    Rx / DC Orders ED Discharge Orders          Ordered    azithromycin (ZITHROMAX) 250 MG tablet  Daily        12/16/20 1206    albuterol (VENTOLIN HFA) 108 (90 Base) MCG/ACT inhaler  Every 6 hours PRN        12/16/20 1206    benzonatate (TESSALON) 100 MG capsule  Every 8 hours        12/16/20 1206             Achille Rich, PA-C 12/16/20 1212    Gloris Manchester, MD 12/18/20 [phone]

## 2020-12-16 NOTE — ED Notes (Signed)
Called for pt 1x. No response

## 2020-12-31 DIAGNOSIS — M1712 Unilateral primary osteoarthritis, left knee: Secondary | ICD-10-CM | POA: Diagnosis not present

## 2021-01-16 ENCOUNTER — Encounter: Payer: Self-pay | Admitting: Family Medicine

## 2021-01-16 ENCOUNTER — Ambulatory Visit (INDEPENDENT_AMBULATORY_CARE_PROVIDER_SITE_OTHER): Payer: BC Managed Care – PPO | Admitting: Family Medicine

## 2021-01-16 ENCOUNTER — Other Ambulatory Visit: Payer: Self-pay

## 2021-01-16 VITALS — BP 148/88 | HR 102 | Temp 97.1°F | Ht 61.0 in | Wt 175.8 lb

## 2021-01-16 DIAGNOSIS — R21 Rash and other nonspecific skin eruption: Secondary | ICD-10-CM | POA: Diagnosis not present

## 2021-01-16 DIAGNOSIS — L0100 Impetigo, unspecified: Secondary | ICD-10-CM | POA: Diagnosis not present

## 2021-01-16 MED ORDER — CEPHALEXIN 500 MG PO CAPS: 500.0000 mg | ORAL_CAPSULE | Freq: Three times a day (TID) | ORAL | 0 refills | Status: AC

## 2021-01-16 NOTE — Progress Notes (Signed)
Established Patient Office Visit  Subjective:  [name], female    DOB: 06-21-1960  Age: 60 y.o. MRN: [id number]  CC:  Chief Complaint  Patient presents with   Rash    Rash on right knee x 1 month spreading more.     HPI Erin Bass presents for a history of rash over her right lateral inferior knee for the past month now.  Rash is been a little bit uncomfortable.  It itches.  Approximately 1 month prior to the onset of this rash she underwent a right knee replacement.  That is done well.  She had taken Orencia until just before her knee replacement.  There have been no fevers chills.  The rashes been a little bit weepy.  Past Medical History:  Diagnosis Date   Anemia    Arthritis    HTN (hypertension)     Past Surgical History:  Procedure Laterality Date   ABDOMINAL HYSTERECTOMY     Partial- Uterus only   bone spur - removal     CESAREAN SECTION     x2   PARTIAL HYSTERECTOMY  01/20/2004   fibroids; ovaries left in    REPLACEMENT TOTAL KNEE Right    TOTAL HIP ARTHROPLASTY Right 12/20/2012   Procedure: RIGHT TOTAL HIP ARTHROPLASTY ANTERIOR APPROACH;  Surgeon: Shelda Pal, MD;  Location: [facility];  Service: Orthopedics;  Laterality: Right;   TUBAL LIGATION      Family History  Problem Relation Age of Onset   Heart disease Father    Anemia Mother    Depression Other        both sides of family     Social History   Socioeconomic History   Marital status: Married    Spouse name: Not on file   Number of children: Not on file   Years of education: Not on file   Highest education level: Not on file  Occupational History   Not on file  Tobacco Use   Smoking status: Never   Smokeless tobacco: Never  Vaping Use   Vaping Use: Never used  Substance and Sexual Activity   Alcohol use: Yes    Comment: occassionally   Drug use: No   Sexual activity: Not Currently  Other Topics Concern   Not on file  Social History Narrative   Married; 2  children, 1 at home.    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   Social Determinants of Health   Financial Resource Strain: Not on file  Food Insecurity: Not on file  Transportation Needs: Not on file  Physical Activity: Not on file  Stress: Not on file  Social Connections: Not on file  Intimate Partner Violence: Not on file    Outpatient Medications Prior to Visit  Medication Sig Dispense Refill   albuterol (VENTOLIN HFA) 108 (90 Base) MCG/ACT inhaler Inhale 1-2 puffs into the lungs every 6 (six) hours as needed for wheezing or shortness of breath. 1 each 0   amLODipine (NORVASC) 5 MG tablet Take 1 tablet (5 mg total) by mouth daily. 90 tablet 3   chlorthalidone (THALITONE) 15 MG tablet Take 1 tablet (15 mg total) by mouth daily. 90 tablet 1   DULoxetine (CYMBALTA) 60 MG capsule Take 1 capsule (60 mg total) by mouth daily. 90 capsule 3   ferrous sulfate 325 (65 FE) MG tablet Take 325 mg by mouth daily with breakfast.     meloxicam (MOBIC) 15 MG  tablet Take 1 tablet (15 mg total) by mouth daily. With food 90 tablet 3   potassium chloride (MICRO-K) 10 MEQ CR capsule Take 1 capsule (10 mEq total) by mouth 2 (two) times daily. 180 capsule 1   Turmeric (QC TUMERIC COMPLEX PO)      azithromycin (ZITHROMAX) 250 MG tablet Take 1 tablet (250 mg total) by mouth daily. Take first 2 tablets together, then 1 every day until finished. 6 tablet 0   ORENCIA CLICKJECT 125 MG/ML SOAJ Inject into the skin. (Patient not taking: Reported on 01/16/2021)     sulfamethoxazole-trimethoprim (BACTRIM DS) 800-160 MG tablet Take 1 tablet by mouth 2 (two) times daily. (Patient not taking: Reported on 01/16/2021) 10 tablet 0   benzonatate (TESSALON) 100 MG capsule Take 1 capsule (100 mg total) by mouth every 8 (eight) hours. 21 capsule 0   No facility-administered medications prior to visit.    Allergies  Allergen Reactions   Leflunomide Other (See Comments)    Rash  Incontinence    Adalimumab  Swelling   Methotrexate Derivatives Other (See Comments)    Elevated Liver Functions and Bruising   Rinvoq [Upadacitinib] Other (See Comments)    Neck stiffness   Simponi [Golimumab] Rash    ROS Review of Systems  Constitutional:  Negative for chills, diaphoresis, fatigue, fever and unexpected weight change.  Respiratory: Negative.    Cardiovascular: Negative.   Gastrointestinal: Negative.   Skin:  Positive for color change and rash.  Neurological:  Negative for speech difficulty and weakness.  Psychiatric/Behavioral: Negative.       Objective:    Physical Exam Vitals and nursing note reviewed.  Constitutional:      General: She is not in acute distress.    Appearance: Normal appearance. She is not ill-appearing, toxic-appearing or diaphoretic.  HENT:     Head: Normocephalic and atraumatic.     Right Ear: External ear normal.     Left Ear: External ear normal.  Eyes:     General: No scleral icterus.       Right eye: No discharge.        Left eye: No discharge.     Conjunctiva/sclera: Conjunctivae normal.  Pulmonary:     Effort: Pulmonary effort is normal.  Skin:    General: Skin is warm and dry.       Neurological:     Mental Status: She is alert and oriented to person, place, and time.  Psychiatric:        Mood and Affect: Mood normal.        Behavior: Behavior normal.    BP (!) 148/88 (BP Location: Right Arm, Patient Position: Sitting, Cuff Size: Normal)    Pulse (!) 102    Temp (!) 97.1 F (36.2 C) (Temporal)    Ht 5\' 1"  (1.549 m)    Wt 175 lb 12.8 oz (79.7 kg)    SpO2 96%    BMI 33.22 kg/m  Wt Readings from Last 3 Encounters:  01/16/21 175 lb 12.8 oz (79.7 kg)  09/12/20 190 lb (86.2 kg)  08/20/20 189 lb 6.4 oz (85.9 kg)     Health Maintenance Due  Topic Date Due   Zoster Vaccines- Shingrix (1 of 2) Never done   COVID-19 Vaccine (2 - Janssen risk series) 08/31/2019   INFLUENZA VACCINE  Never done    There are no preventive care reminders to display  for this patient.  Lab Results  Component Value Date   TSH 0.94 09/12/2020  Lab Results  Component Value Date   WBC 9.3 09/12/2020   HGB 10.7 (L) 09/12/2020   HCT 34.5 (L) 09/12/2020   MCV 75.1 (L) 09/12/2020   PLT 154.0 09/12/2020   Lab Results  Component Value Date   NA 138 09/12/2020   K 3.2 (L) 09/12/2020   CO2 27 09/12/2020   GLUCOSE 80 09/12/2020   BUN 18 09/12/2020   CREATININE 0.82 09/12/2020   BILITOT 0.4 09/12/2020   ALKPHOS 68 09/12/2020   AST 15 09/12/2020   ALT 12 09/12/2020   PROT 7.2 09/12/2020   ALBUMIN 4.1 09/12/2020   CALCIUM 9.7 09/12/2020   ANIONGAP 9 04/24/2020   GFR 77.62 09/12/2020   Lab Results  Component Value Date   CHOL 289 (H) 09/12/2020   Lab Results  Component Value Date   HDL 91.90 09/12/2020   Lab Results  Component Value Date   LDLCALC 180 (H) 09/12/2020   Lab Results  Component Value Date   TRIG 83.0 09/12/2020   Lab Results  Component Value Date   CHOLHDL 3 09/12/2020   Lab Results  Component Value Date   HGBA1C 5.8 09/12/2020      Assessment & Plan:   Problem List Items Addressed This Visit       Musculoskeletal and Integument   Rash/skin eruption - Primary   Relevant Medications   cephALEXin (KEFLEX) 500 MG capsule   Other Relevant Orders   Herpes culture, rapid   Wound culture   Impetigo   Relevant Medications   cephALEXin (KEFLEX) 500 MG capsule    Meds ordered this encounter  Medications   cephALEXin (KEFLEX) 500 MG capsule    Sig: Take 1 capsule (500 mg total) by mouth 3 (three) times daily for 10 days.    Dispense:  30 capsule    Refill:  0    Follow-up: Return if symptoms worsen or fail to improve.    Mliss Sax, MD

## 2021-01-19 LAB — WOUND CULTURE
MICRO NUMBER:: 12808845
SPECIMEN QUALITY:: ADEQUATE

## 2021-01-22 LAB — VARICELLA ZOSTER VIRUS,RAPID METHOD,CULTURE

## 2021-01-22 LAB — HERPES SIMPLEX VIRUS CULTURE

## 2021-01-27 ENCOUNTER — Encounter: Payer: Self-pay | Admitting: Nurse Practitioner

## 2021-01-28 ENCOUNTER — Other Ambulatory Visit: Payer: Self-pay

## 2021-01-31 ENCOUNTER — Other Ambulatory Visit: Payer: Self-pay

## 2021-01-31 ENCOUNTER — Encounter: Payer: Self-pay | Admitting: Nurse Practitioner

## 2021-01-31 ENCOUNTER — Ambulatory Visit: Payer: BC Managed Care – PPO | Admitting: Nurse Practitioner

## 2021-01-31 VITALS — BP 136/88 | HR 100 | Temp 96.1°F | Ht 61.0 in | Wt 175.2 lb

## 2021-01-31 DIAGNOSIS — L239 Allergic contact dermatitis, unspecified cause: Secondary | ICD-10-CM | POA: Insufficient documentation

## 2021-01-31 MED ORDER — PREDNISONE 20 MG PO TABS: ORAL_TABLET | ORAL | 0 refills | Status: AC

## 2021-01-31 MED ORDER — CALAMINE EX LOTN
1.0000 | TOPICAL_LOTION | CUTANEOUS | 0 refills | Status: DC | PRN
Start: 2021-01-31 — End: 2021-06-13

## 2021-01-31 MED ORDER — VALACYCLOVIR HCL 1 G PO TABS: 1000.0000 mg | ORAL_TABLET | Freq: Three times a day (TID) | ORAL | 0 refills | Status: DC

## 2021-01-31 NOTE — Progress Notes (Signed)
Subjective:  [name], female    DOB: November 17, 1960  Age: 61 y.o. MRN: [id number]  CC: Acute Visit (Pt c/o rash on her right leg that started in December 2022 and is now spreading. Pt states the rash is large in size and itches. )  HPI  Allergic dermatitis She has similar rash 02/2012 after right hip arthroplasty. Treated with oral prednisone and oral antibiotics but no improvement. Rash eventually resolved after several weeks per patient.  Onset of current rash on right knee after R. total knee replacement 12/2020: started as erythematous confluent vesicular rash serous drainage. Associated with tenderness and itching. She was evaluated by my colleague Dr. Doreene Burke on 01/16/21. Wound culture and viral culture were negative. No improvement with keflex 500mg x 10days. She also use topical corticosteroid with no improvement. Today rash has no drainage and no erythema. No joint effusion. It is no warm to touch.  I think rash is possible due to allergic reaction from surgical glue. Sent oral prednisone advised to use topical benadryl or caladryl. Entered referral to dermatology  Reviewed past Medical, Social and Family history today.  Outpatient Medications Prior to Visit  Medication Sig Dispense Refill   albuterol (VENTOLIN HFA) 108 (90 Base) MCG/ACT inhaler Inhale 1-2 puffs into the lungs every 6 (six) hours as needed for wheezing or shortness of breath. 1 each 0   amLODipine (NORVASC) 5 MG tablet Take 1 tablet (5 mg total) by mouth daily. 90 tablet 3   chlorthalidone (THALITONE) 15 MG tablet Take 1 tablet (15 mg total) by mouth daily. 90 tablet 1   DULoxetine (CYMBALTA) 60 MG capsule Take 1 capsule (60 mg total) by mouth daily. 90 capsule 3   ferrous sulfate 325 (65 FE) MG tablet Take 325 mg by mouth daily with breakfast.     meloxicam (MOBIC) 15 MG tablet Take 1 tablet (15 mg total) by mouth daily. With food 90 tablet 3   potassium chloride (MICRO-K) 10 MEQ CR capsule Take 1  capsule (10 mEq total) by mouth 2 (two) times daily. 180 capsule 1   Turmeric (QC TUMERIC COMPLEX PO)      ORENCIA CLICKJECT 125 MG/ML SOAJ Inject into the skin. (Patient not taking: Reported on 01/16/2021)     sulfamethoxazole-trimethoprim (BACTRIM DS) 800-160 MG tablet Take 1 tablet by mouth 2 (two) times daily. (Patient not taking: Reported on 01/16/2021) 10 tablet 0   No facility-administered medications prior to visit.    ROS See HPI  Objective:  BP 136/88 (BP Location: Left Arm, Patient Position: Sitting, Cuff Size: Large)    Pulse 100    Temp (!) 96.1 F (35.6 C) (Temporal)    Ht 5\' 1"  (1.549 m)    Wt 175 lb 3.2 oz (79.5 kg)    SpO2 96%    BMI 33.10 kg/m   Physical Exam Skin:    Findings: Rash present. No erythema. Rash is macular, papular and scaling.  Neurological:     Mental Status: She is alert.   Assessment & Plan:  This visit occurred during the SARS-CoV-2 public health emergency.  Safety protocols were in place, including screening questions prior to the visit, additional usage of staff PPE, and extensive cleaning of exam room while observing appropriate contact time as indicated for disinfecting solutions.   Erin Bass was seen today for acute visit.  Diagnoses and all orders for this visit:  Allergic dermatitis -     Discontinue: valACYclovir (VALTREX) 1000 MG tablet; Take 1 tablet (1,000  mg total) by mouth 3 (three) times daily. -     calamine lotion; Apply 1 application topically as needed for itching. -     predniSONE (DELTASONE) 20 MG tablet; Take 2 tablets (40 mg total) by mouth daily with breakfast for 1 day, THEN 1 tablet (20 mg total) daily with breakfast for 2 days. -     Ambulatory referral to Dermatology  Possible allergic reaction to surgical glue.  Problem List Items Addressed This Visit       Musculoskeletal and Integument   Allergic dermatitis - Primary    She has similar rash 02/2012 after right hip arthroplasty. Treated with oral prednisone and  oral antibiotics but no improvement. Rash eventually resolved after several weeks per patient.  Onset of current rash on right knee after R. total knee replacement 12/2020: started as erythematous confluent vesicular rash serous drainage. Associated with tenderness and itching. She was evaluated by my colleague Dr. Doreene BurkeKremer on 01/16/21. Wound culture and viral culture were negative. No improvement with keflex 500mg x 10days. She also use topical corticosteroid with no improvement. Today rash has no drainage and no erythema. No joint effusion. It is no warm to touch.  I think rash is possible due to allergic reaction from surgical glue. Sent oral prednisone advised to use topical benadryl or caladryl. Entered referral to dermatology       Relevant Medications   calamine lotion   predniSONE (DELTASONE) 20 MG tablet   Other Relevant Orders   Ambulatory referral to Dermatology    Follow-up: Return in about 3 months (around 05/01/2021) for CPE (fasting).  Alysia Pennaharlotte Madlynn Lundeen, NP

## 2021-01-31 NOTE — Patient Instructions (Addendum)
Use caladryl for itching Ok to also use benadryl 25mg  for generalized itching. You will be contacted to schedule appt with dermatology.

## 2021-01-31 NOTE — Assessment & Plan Note (Signed)
She has similar rash 02/2012 after right hip arthroplasty. Treated with oral prednisone and oral antibiotics but no improvement. Rash eventually resolved after several weeks per patient.  Onset of current rash on right knee after R. total knee replacement 12/2020: started as erythematous confluent vesicular rash serous drainage. Associated with tenderness and itching. She was evaluated by my colleague Dr. Ethelene Hal on 01/16/21. Wound culture and viral culture were negative. No improvement with keflex 500mg x 10days. She also use topical corticosteroid with no improvement. Today rash has no drainage and no erythema. No joint effusion. It is no warm to touch.  I think rash is possible due to allergic reaction from surgical glue. Sent oral prednisone advised to use topical benadryl or caladryl. Entered referral to dermatology

## 2021-02-11 DIAGNOSIS — L239 Allergic contact dermatitis, unspecified cause: Secondary | ICD-10-CM | POA: Diagnosis not present

## 2021-02-11 DIAGNOSIS — D492 Neoplasm of unspecified behavior of bone, soft tissue, and skin: Secondary | ICD-10-CM | POA: Diagnosis not present

## 2021-02-11 DIAGNOSIS — L259 Unspecified contact dermatitis, unspecified cause: Secondary | ICD-10-CM | POA: Diagnosis not present

## 2021-02-26 DIAGNOSIS — M25552 Pain in left hip: Secondary | ICD-10-CM | POA: Diagnosis not present

## 2021-03-13 ENCOUNTER — Other Ambulatory Visit: Payer: Self-pay | Admitting: Nurse Practitioner

## 2021-03-13 DIAGNOSIS — I10 Essential (primary) hypertension: Secondary | ICD-10-CM

## 2021-04-05 ENCOUNTER — Encounter: Payer: Self-pay | Admitting: Nurse Practitioner

## 2021-04-18 DIAGNOSIS — Z96651 Presence of right artificial knee joint: Secondary | ICD-10-CM | POA: Diagnosis not present

## 2021-04-18 DIAGNOSIS — M1712 Unilateral primary osteoarthritis, left knee: Secondary | ICD-10-CM | POA: Diagnosis not present

## 2021-04-18 DIAGNOSIS — M25562 Pain in left knee: Secondary | ICD-10-CM | POA: Diagnosis not present

## 2021-05-21 ENCOUNTER — Ambulatory Visit: Payer: BC Managed Care – PPO | Admitting: Family Medicine

## 2021-05-26 ENCOUNTER — Ambulatory Visit (INDEPENDENT_AMBULATORY_CARE_PROVIDER_SITE_OTHER): Payer: BC Managed Care – PPO | Admitting: Family Medicine

## 2021-05-26 ENCOUNTER — Encounter: Payer: Self-pay | Admitting: Family Medicine

## 2021-05-26 VITALS — BP 140/94 | HR 93 | Temp 98.6°F | Ht 61.0 in | Wt 177.4 lb

## 2021-05-26 DIAGNOSIS — M7062 Trochanteric bursitis, left hip: Secondary | ICD-10-CM | POA: Diagnosis not present

## 2021-05-26 DIAGNOSIS — M0579 Rheumatoid arthritis with rheumatoid factor of multiple sites without organ or systems involvement: Secondary | ICD-10-CM

## 2021-05-26 DIAGNOSIS — M25552 Pain in left hip: Secondary | ICD-10-CM | POA: Diagnosis not present

## 2021-05-26 MED ORDER — TRIAMCINOLONE ACETONIDE 40 MG/ML IJ SUSP
40.0000 mg | Freq: Once | INTRAMUSCULAR | Status: AC
  Administered 2021-05-26: 40 mg via INTRA_ARTICULAR

## 2021-05-26 NOTE — Progress Notes (Signed)
? ? ?Erin Bass T. Erin Gappa, MD, [facility] ?Therapist, music at [facility] ?[place] ?[place], [address] ? ?Phone: [phone]  FAX: [phone] ? ?Erin Bass - 61 y.o. female  MRN [id number]  Date of Birth: [birth year] ? ?Date: 05/26/2021  PCP: Flossie Buffy, NP  Referral: Flossie Buffy, NP ? ?Chief Complaint  ?Patient presents with  ? Hip Pain  ?  Left injection  ? ? ?This visit occurred during the SARS-CoV-2 public health emergency.  Safety protocols were in place, including screening questions prior to the visit, additional usage of staff PPE, and extensive cleaning of exam room while observing appropriate contact time as indicated for disinfecting solutions.  ? ?Subjective:  ? ?Erin Bass is a 61 y.o. very pleasant female patient with Body mass index is 33.53 kg/m?. who presents with the following: ? ?L hip pain: She is a well-known patient, that I have seen in the past multiple times.  She has very significant rheumatoid arthritis, and on chart review the patient had the most recent CRP greater than 100 with markedly elevated rheumatoid factor as well as markedly elevated anti-CCP antibodies. ? ?She reports to me that she has had some difficulties with DMARDs.  At this point, I do not think that she has a rheumatological home, and she is not going to take additional DMARDs. ? ?Right now she is having lateral hip pain, she does have pain in other joints as well as some anterior hip pain.  No recent trauma or injury ? ?Review of Systems is noted in the HPI, as appropriate ? ?Objective:  ? ?BP (!) 140/94   Pulse 93   Temp 98.6 ?F (37 ?C) (Oral)   Ht 5\' 1"  (1.549 m)   Wt 177 lb 7 oz (80.5 kg)   SpO2 96%   BMI 33.53 kg/m?  ? ?GEN: No acute distress; alert,appropriate. ?PULM: Breathing comfortably in no respiratory distress ?PSYCH: Normally interactive.  ? ?She does have some generalized restriction in the left hip as well as some pain with palpation in  multiple areas around the topical skin as well as with movement of the hip. ?Grossly 15 degrees of motion in abduction with 10 degrees of rotational movement. ?Pain is noted from mild palpation even in the skin and other areas in and about the hip. ? ?She does point to the area of maximal tenderness on the lateral trochanteric bursa, this is where she clearly notes that the area of maximal tenderness ? ?Lower extremities are neurovascularly intact ? ?Laboratory and Imaging Data: ? ?Assessment and Plan:  ? ?  ICD-10-CM   ?1. Left hip pain  M25.552 triamcinolone acetonide (KENALOG-40) injection 40 mg  ?  ?2. Rheumatoid arthritis involving multiple sites with positive rheumatoid factor (HCC)  M05.79   ?  ? ?Without question, patient has rheumatoid arthritis.  I am doubtful that she will do well long-term without some form of disease modifying agent.  In 2020 she did have a sed rate of 77 and a CRP greater than 100.  Right now, she would like to avoid these medications in the future. ? ?I will offer her a trochanteric bursa injection today, and this may give her some symptomatic relief for a while.  She is considering a total hip arthroplasty in the near future. ? ?Aspiration/Injection Procedure Note ?Erin Bass ?[birth year] ?Date of procedure: 05/26/2021 ? ?Procedure: Large Joint Aspiration / Injection of Hip, Trochanteric Bursa, L ?Indications: Pain ? ?Procedure Details ?  Verbal consent obtained. Risks, benefits, and alternatives reviewed. Greater trochanter sterilely prepped with Chloraprep. Ethyl Chloride used for anesthesia. 9 cc of Lidocaine 1% injected with 1 mL of Kenalog 40 mg into trochanteric bursa at area of maximal tenderness at greater trochanter. Needle taken to bone to troch bursa, flows easily. Bursa massaged. No bleeding and no complications. Decreased pain after injection. ?Needle: 22 gauge spinal needle ?Medication: 1 mL of Kenalog 40 mg  ? ?Medication Management during today's office visit: ? ?New  prescription management for evaluated conditions today and refills : ?Meds ordered this encounter  ?Medications  ? triamcinolone acetonide (KENALOG-40) injection 40 mg  ? ? ?Discontinued medications managed today: ?There are no discontinued medications. ? ?Orders placed today for conditions managed today: ?No orders of the defined types were placed in this encounter. ? ? ?Follow-up: No follow-ups on file. ? ?Dragon [employer] One speech-to-text software was used for transcription in this dictation.  Possible transcriptional errors can occur using Editor, commissioning.  ? ?Signed, ? ?Erin Gierke T. Kaitland Lewellyn, MD ? ? ?Outpatient Encounter Medications as of 05/26/2021  ?Medication Sig  ? albuterol (VENTOLIN HFA) 108 (90 Base) MCG/ACT inhaler Inhale 1-2 puffs into the lungs every 6 (six) hours as needed for wheezing or shortness of breath.  ? amLODipine (NORVASC) 5 MG tablet Take 1 tablet (5 mg total) by mouth daily.  ? calamine lotion Apply 1 application topically as needed for itching.  ? DULoxetine (CYMBALTA) 60 MG capsule Take 1 capsule (60 mg total) by mouth daily.  ? ferrous sulfate 325 (65 FE) MG tablet Take 325 mg by mouth daily with breakfast.  ? meloxicam (MOBIC) 15 MG tablet Take 1 tablet (15 mg total) by mouth daily. With food  ? potassium chloride (MICRO-K) 10 MEQ CR capsule Take 1 capsule (10 mEq total) by mouth 2 (two) times daily.  ? THALITONE 15 MG tablet TAKE 1 TABLET (15 MG TOTAL) BY MOUTH DAILY.  ? traMADol (ULTRAM) 50 MG tablet Take 50 mg by mouth 2 (two) times daily as needed.  ? Turmeric (QC TUMERIC COMPLEX PO)   ? [EXPIRED] triamcinolone acetonide (KENALOG-40) injection 40 mg   ? ?No facility-administered encounter medications on file as of 05/26/2021.  ?  ?

## 2021-05-28 ENCOUNTER — Telehealth: Payer: Self-pay

## 2021-05-28 NOTE — Telephone Encounter (Signed)
Received a surgery clearance form for patient, LVM for patient informing her to call and schedule a appointment in order for forms to be completed, per [facility]-NP.  ?

## 2021-06-11 DIAGNOSIS — M1712 Unilateral primary osteoarthritis, left knee: Secondary | ICD-10-CM | POA: Diagnosis not present

## 2021-06-13 ENCOUNTER — Ambulatory Visit: Payer: BC Managed Care – PPO | Admitting: Nurse Practitioner

## 2021-06-13 ENCOUNTER — Encounter: Payer: Self-pay | Admitting: Nurse Practitioner

## 2021-06-13 VITALS — BP 136/86 | HR 84 | Temp 96.5°F | Ht 61.0 in | Wt 175.6 lb

## 2021-06-13 DIAGNOSIS — Z01818 Encounter for other preprocedural examination: Secondary | ICD-10-CM

## 2021-06-13 DIAGNOSIS — I10 Essential (primary) hypertension: Secondary | ICD-10-CM | POA: Diagnosis not present

## 2021-06-13 DIAGNOSIS — M0579 Rheumatoid arthritis with rheumatoid factor of multiple sites without organ or systems involvement: Secondary | ICD-10-CM | POA: Diagnosis not present

## 2021-06-13 DIAGNOSIS — R739 Hyperglycemia, unspecified: Secondary | ICD-10-CM | POA: Diagnosis not present

## 2021-06-13 LAB — CBC
HCT: 34.2 % — ABNORMAL LOW (ref 36.0–46.0)
Hemoglobin: 10.7 g/dL — ABNORMAL LOW (ref 12.0–15.0)
MCHC: 31.2 g/dL (ref 30.0–36.0)
MCV: 72.6 fl — ABNORMAL LOW (ref 78.0–100.0)
Platelets: 291 10*3/uL (ref 150.0–400.0)
RBC: 4.71 Mil/uL (ref 3.87–5.11)
RDW: 15.6 % — ABNORMAL HIGH (ref 11.5–15.5)
WBC: 8.9 10*3/uL (ref 4.0–10.5)

## 2021-06-13 LAB — COMPREHENSIVE METABOLIC PANEL
ALT: 9 U/L (ref 0–35)
AST: 16 U/L (ref 0–37)
Albumin: 4.1 g/dL (ref 3.5–5.2)
Alkaline Phosphatase: 81 U/L (ref 39–117)
BUN: 23 mg/dL (ref 6–23)
CO2: 28 mEq/L (ref 19–32)
Calcium: 10.1 mg/dL (ref 8.4–10.5)
Chloride: 100 mEq/L (ref 96–112)
Creatinine, Ser: 0.88 mg/dL (ref 0.40–1.20)
GFR: 70.94 mL/min (ref >60.00)
Glucose, Bld: 92 mg/dL (ref 70–99)
Potassium: 4.3 mEq/L (ref 3.5–5.1)
Sodium: 138 mEq/L (ref 135–145)
Total Bilirubin: 0.3 mg/dL (ref 0.2–1.2)
Total Protein: 8.1 g/dL (ref 6.0–8.3)

## 2021-06-13 LAB — HEMOGLOBIN A1C: Hgb A1c MFr Bld: 5.6 % (ref 4.6–6.5)

## 2021-06-13 MED ORDER — AMLODIPINE BESYLATE 5 MG PO TABS: 5.0000 mg | ORAL_TABLET | Freq: Every day | ORAL | 3 refills | Status: AC

## 2021-06-13 MED ORDER — PREDNISONE 10 MG (21) PO TBPK: ORAL_TABLET | ORAL | 0 refills | Status: DC

## 2021-06-13 NOTE — Assessment & Plan Note (Addendum)
reports acute on chronic multiple joint pain and stiffness, worse in left hip. Unable to tolerate any immunomodilators. Requesting for prednisone to manage current exacerbation. She reports last oral prednisone takes was 61months ago. She also takes celebrex and tramadol prescribed by orthopedist  Prednisone 10mg  dose pack sent Advised to f/up with rheumatology to discuss long term use of prednisone.

## 2021-06-13 NOTE — Patient Instructions (Addendum)
Go to lab. Wish you a quick recovery. Discuss pain management with orthopedist.

## 2021-06-13 NOTE — Progress Notes (Signed)
Acute Office Visit  Subjective:    Patient ID: Erin StarcherDarlene B Pattillo, female    DOB: 07/12/1960, 61 y.o.   MRN: [id number]  Chief Complaint  Patient presents with   Acute Visit    Left hip surgery pre-op appt Hip pain  No other concerns today     HPI Patient is in today for pre op clearance for Left hip total Arthroplasty on 07/15/2021 by Dr. Malena CatholicLucille with [facility]. She has had other surgical procedures which required general anesthesia. She has no history anesthesia complication, DVT/PE, hypercoagulation, or hemophilia. No known hx of CAD/MI.  Past Medical History:  Diagnosis Date   Anemia    Arthritis    HTN (hypertension)     Past Surgical History:  Procedure Laterality Date   ABDOMINAL HYSTERECTOMY     Partial- Uterus only   bone spur - removal     CESAREAN SECTION     x2   PARTIAL HYSTERECTOMY  01/20/2004   fibroids; ovaries left in    REPLACEMENT TOTAL KNEE Right    TOTAL HIP ARTHROPLASTY Right 12/20/2012   Procedure: RIGHT TOTAL HIP ARTHROPLASTY ANTERIOR APPROACH;  Surgeon: Shelda PalMatthew D Olin, MD;  Location: [facility];  Service: Orthopedics;  Laterality: Right;   TUBAL LIGATION      Outpatient Medications Prior to Visit  Medication Sig   celecoxib (CELEBREX) 200 MG capsule Take by mouth 2 (two) times daily.   DULoxetine (CYMBALTA) 60 MG capsule Take 1 capsule (60 mg total) by mouth daily.   ferrous sulfate 325 (65 FE) MG tablet Take 325 mg by mouth daily with breakfast.   potassium chloride (MICRO-K) 10 MEQ CR capsule Take 1 capsule (10 mEq total) by mouth 2 (two) times daily.   THALITONE 15 MG tablet TAKE 1 TABLET (15 MG TOTAL) BY MOUTH DAILY.   traMADol (ULTRAM) 50 MG tablet Take 50 mg by mouth 2 (two) times daily as needed.   Turmeric (QC TUMERIC COMPLEX PO)    [DISCONTINUED] amLODipine (NORVASC) 5 MG tablet Take 1 tablet (5 mg total) by mouth daily.   [DISCONTINUED] meloxicam (MOBIC) 15 MG tablet Take 1 tablet (15 mg total) by mouth daily. With food   [DISCONTINUED]  albuterol (VENTOLIN HFA) 108 (90 Base) MCG/ACT inhaler Inhale 1-2 puffs into the lungs every 6 (six) hours as needed for wheezing or shortness of breath.   [DISCONTINUED] calamine lotion Apply 1 application topically as needed for itching.   No facility-administered medications prior to visit.   Reviewed past medical and social history.   Review of Systems  Constitutional:  Negative for fever and unexpected weight change.  HENT: Negative.    Respiratory: Negative.    Cardiovascular: Negative.   Musculoskeletal:  Positive for arthralgias.  Skin: Negative.   Neurological: Negative.   Psychiatric/Behavioral: Negative.        Objective:    Physical Exam Vitals reviewed.  Cardiovascular:     Rate and Rhythm: Normal rate and regular rhythm.     Pulses: Normal pulses.     Heart sounds: Normal heart sounds.  Pulmonary:     Effort: Pulmonary effort is normal.     Breath sounds: Normal breath sounds.  Musculoskeletal:     Left hip: Tenderness present. Decreased range of motion. Decreased strength.     Right lower leg: No edema.     Left lower leg: No edema.     Comments: Ambulates with cane  Neurological:     Mental Status: She is alert and oriented  to person, place, and time.   BP 136/86 (BP Location: Right Arm, Patient Position: Sitting, Cuff Size: Normal)   Pulse 84   Temp (!) 96.5 F (35.8 C) (Temporal)   Ht  (1.549 m)   Wt 175 lb 9.6 oz (79.7 kg)   SpO2 96%   BMI 33.18 kg/m  BP Readings from Last 3 Encounters:  06/13/21 136/86  05/26/21 (!) 140/94  01/31/21 136/88   Wt Readings from Last 3 Encounters:  06/13/21 175 lb 9.6 oz (79.7 kg)  05/26/21 177 lb 7 oz (80.5 kg)  01/31/21 175 lb 3.2 oz (79.5 kg)    ECG: NSR, no change compared to 2022 ECG  No results found for any visits on 06/13/21.     Assessment & Plan:   Problem List Items Addressed This Visit       Cardiovascular and Mediastinum   Essential (primary) hypertension    BP at goal with  amlodipine BP Readings from Last 3 Encounters:  06/13/21 136/86  05/26/21 (!) 140/94  01/31/21 136/88   Maintain med dose Repeat CMP      Relevant Medications   amLODipine (NORVASC) 5 MG tablet     Musculoskeletal and Integument   Rheumatoid arthritis involving multiple sites with positive rheumatoid factor (HCC)    reports acute on chronic multiple joint pain and stiffness, worse in left hip. Unable to tolerate any immunomodilators. Requesting for prednisone to manage current exacerbation. She reports last oral prednisone takes was 6months ago. She also takes celebrex and tramadol prescribed by orthopedist  Prednisone  dose pack sent Advised to f/up with rheumatology to discuss long term use of prednisone.       Relevant Medications   celecoxib (CELEBREX) 200 MG capsule   predniSONE (STERAPRED UNI-PAK 21 TAB) 10 MG (21) TBPK tablet     Other   Hyperglycemia   Other Visit Diagnoses     Pre-operative clearance    -  Primary   Relevant Orders   CBC   Comprehensive metabolic panel   Hemoglobin A1c   EKG 12-Lead (Completed)      Meds ordered this encounter  Medications   predniSONE (STERAPRED UNI-PAK 21 TAB) 10 MG (21) TBPK tablet    Sig: As directed on package    Dispense:  21 tablet    Refill:  0    Order Specific Question:   Supervising Provider    Answer:   Nadene Rubins ALFRED [[id number]]   amLODipine (NORVASC) 5 MG tablet    Sig: Take 1 tablet (5 mg total) by mouth daily.    Dispense:  90 tablet    Refill:  3    Order Specific Question:   Supervising Provider    Answer:   Mliss Sax [[id number]]   Return in about 6 months (around 12/14/2021) for HTN and, hyperlipidemia (fasting).    [name], NP

## 2021-06-13 NOTE — Assessment & Plan Note (Signed)
BP at goal with amlodipine BP Readings from Last 3 Encounters:  06/13/21 136/86  05/26/21 (!) 140/94  01/31/21 136/88   Maintain med dose Repeat CMP

## 2021-06-17 ENCOUNTER — Telehealth: Payer: Self-pay

## 2021-06-17 NOTE — Telephone Encounter (Signed)
called pt to verify who we needed to fax pre op OV forms to. couldn't find [place] in [employer].

## 2021-06-19 NOTE — Telephone Encounter (Signed)
Forms Faxed

## 2021-06-19 NOTE — Telephone Encounter (Signed)
Pt called back, the provider is Dr. Gus RankinFrank V. Aluisio, MD Address: [address], [place], [place] [address] Phone: [phone]

## 2021-07-01 ENCOUNTER — Ambulatory Visit: Payer: Self-pay | Admitting: Student

## 2021-07-01 DIAGNOSIS — M1612 Unilateral primary osteoarthritis, left hip: Secondary | ICD-10-CM | POA: Diagnosis not present

## 2021-07-01 DIAGNOSIS — Z0189 Encounter for other specified special examinations: Secondary | ICD-10-CM | POA: Diagnosis not present

## 2021-07-02 NOTE — Progress Notes (Addendum)
   PCP - Anne Ng, NP clearance 06-13-21 LOV note  on chart /epic on chart 06-18-21 Cardiologist - no  PPM/ICD -  Device Orders -  Rep Notified -   Chest x-ray - 12-16-20 epic EKG - 06-13-21 epic Stress Test -  ECHO -  Cardiac Cath -   Sleep Study -  CPAP -   Fasting Blood Sugar -  Checks Blood Sugar _____ times a day  Blood Thinner Instructions: Aspirin Instructions:  ERAS Protcol - PRE-SURGERY Ensure    COVID vaccine -yes J&J x 1   Activity--Adl's completed with no SOB or CP Anesthesia review: HTN  Patient denies shortness of breath, fever, cough and chest pain at PAT appointment   All instructions explained to the patient, with a verbal understanding of the material. Patient agrees to go over the instructions while at home for a better understanding. Patient also instructed to self quarantine after being tested for COVID-19. The opportunity to ask questions was provided.

## 2021-07-02 NOTE — Patient Instructions (Addendum)
DUE TO COVID-19 ONLY TWO VISITORS  (aged 61 and older)  ARE ALLOWED TO COME WITH YOU AND STAY IN THE WAITING ROOM ONLY DURING PRE OP AND PROCEDURE.   **NO VISITORS ARE ALLOWED IN THE SHORT STAY AREA OR RECOVERY ROOM!!**    Your procedure is scheduled on: 07-17-21   Report to [facility] Main Entrance    Report to admitting at      1100  AM   Call this number if you have problems the morning of surgery [phone]   Do not eat food :After Midnight.   After Midnight you may have the following liquids until _1030_____ AM DAY OF SURGERY  Water Black Coffee (sugar ok, NO MILK/CREAM OR CREAMERS)  Tea (sugar ok, NO MILK/CREAM OR CREAMERS) regular and decaf                             Plain Jell-O (NO RED)                                           Fruit ices (not with fruit pulp, NO RED)                                     Popsicles (NO RED)                                                                  Juice: apple, WHITE grape, WHITE cranberry Sports drinks like Gatorade (NO RED) Clear broth(vegetable,chicken,beef)                     The day of surgery:  Drink ONE (1) Pre-Surgery Clear Ensure at   1030 AM the morning of surgery. Drink in one sitting. Do not sip.  This drink was given to you during your hospital  pre-op appointment visit. Nothing else to drink after completing the  Pre-Surgery Clear Ensure          If you have questions, please contact your surgeon's office.   FOLLOW AND ANY ADDITIONAL PRE OP INSTRUCTIONS YOU RECEIVED FROM YOUR SURGEON'S OFFICE!!!     Oral Hygiene is also important to reduce your risk of infection.                                    Remember - BRUSH YOUR TEETH THE MORNING OF SURGERY WITH YOUR REGULAR TOOTHPASTE   Do NOT smoke after Midnight   Take these medicines the morning of surgery with A SIP OF WATER: Duloxentine, amlodipine, tylenol if needed  DO NOT TAKE ANY ORAL DIABETIC MEDICATIONS DAY OF YOUR SURGERY  Bring CPAP mask  and tubing day of surgery.                              You may not have any metal on your body including hair pins, jewelry, and body piercing  Do not wear make-up, lotions, powders, perfumes/cologne, or deodorant  Do not wear nail polish including gel and S&S, artificial/acrylic nails, or any other type of covering on natural nails including finger and toenails. If you have artificial nails, gel coating, etc. that needs to be removed by a nail salon please have this removed prior to surgery or surgery may need to be canceled/ delayed if the surgeon/ anesthesia feels like they are unable to be safely monitored.   Do not shave  48 hours prior to surgery.               Do not bring valuables to the hospital. [facility] IS NOT             RESPONSIBLE   FOR VALUABLES.   Contacts, dentures or bridgework may not be worn into surgery.   Bring small overnight bag day of surgery.   DO NOT BRING YOUR HOME MEDICATIONS TO THE HOSPITAL. PHARMACY WILL DISPENSE MEDICATIONS LISTED ON YOUR MEDICATION LIST TO YOU DURING YOUR ADMISSION IN THE HOSPITAL!    Patients discharged on the day of surgery will not be allowed to drive home.  Someone NEEDS to stay with you for the first 24 hours after anesthesia.                Please read over the following fact sheets you were given: IF YOU HAVE QUESTIONS ABOUT YOUR PRE-OP INSTRUCTIONS PLEASE CALL [phone]     [facility] - Preparing for Surgery Before surgery, you can play an important role.  Because skin is not sterile, your skin needs to be as free of germs as possible.  You can reduce the number of germs on your skin by washing with CHG (chlorahexidine gluconate) soap before surgery.  CHG is an antiseptic cleaner which kills germs and bonds with the skin to continue killing germs even after washing. Please DO NOT use if you have an allergy to CHG or antibacterial soaps.  If your skin becomes reddened/irritated stop using the CHG and inform your  nurse when you arrive at Short Stay. Do not shave (including legs and underarms) for at least 48 hours prior to the first CHG shower.  You may shave your face/neck. Please follow these instructions carefully:  1.  Shower with CHG Soap the night before surgery and the  morning of Surgery.  2.  If you choose to wash your hair, wash your hair first as usual with your  normal  shampoo.  3.  After you shampoo, rinse your hair and body thoroughly to remove the  shampoo.                           4.  Use CHG as you would any other liquid soap.  You can apply chg directly  to the skin and wash                       Gently with a scrungie or clean washcloth.  5.  Apply the CHG Soap to your body ONLY FROM THE NECK DOWN.   Do not use on face/ open                           Wound or open sores. Avoid contact with eyes, ears mouth and genitals (private parts).  Wash face,  Genitals (private parts) with your normal soap.             6.  Wash thoroughly, paying special attention to the area where your surgery  will be performed.  7.  Thoroughly rinse your body with warm water from the neck down.  8.  DO NOT shower/wash with your normal soap after using and rinsing off  the CHG Soap.                9.  Pat yourself dry with a clean towel.            10.  Wear clean pajamas.            11.  Place clean sheets on your bed the night of your first shower and do not  sleep with pets. Day of Surgery : Do not apply any lotions/deodorants the morning of surgery.  Please wear clean clothes to the hospital/surgery center.  FAILURE TO FOLLOW THESE INSTRUCTIONS MAY RESULT IN THE CANCELLATION OF YOUR SURGERY PATIENT SIGNATURE_________________________________  NURSE SIGNATURE__________________________________  ________________________________________________________________________   Rogelia Mire  An incentive spirometer is a tool that can help keep your lungs clear and active. This tool  measures how well you are filling your lungs with each breath. Taking long deep breaths may help reverse or decrease the chance of developing breathing (pulmonary) problems (especially infection) following: A long period of time when you are unable to move or be active. BEFORE THE PROCEDURE  If the spirometer includes an indicator to show your best effort, your nurse or respiratory therapist will set it to a desired goal. If possible, sit up straight or lean slightly forward. Try not to slouch. Hold the incentive spirometer in an upright position. INSTRUCTIONS FOR USE  Sit on the edge of your bed if possible, or sit up as far as you can in bed or on a chair. Hold the incentive spirometer in an upright position. Breathe out normally. Place the mouthpiece in your mouth and seal your lips tightly around it. Breathe in slowly and as deeply as possible, raising the piston or the ball toward the top of the column. Hold your breath for 3-5 seconds or for as long as possible. Allow the piston or ball to fall to the bottom of the column. Remove the mouthpiece from your mouth and breathe out normally. Rest for a few seconds and repeat Steps 1 through 7 at least 10 times every 1-2 hours when you are awake. Take your time and take a few normal breaths between deep breaths. The spirometer may include an indicator to show your best effort. Use the indicator as a goal to work toward during each repetition. After each set of 10 deep breaths, practice coughing to be sure your lungs are clear. If you have an incision (the cut made at the time of surgery), support your incision when coughing by placing a pillow or rolled up towels firmly against it. Once you are able to get out of bed, walk around indoors and cough well. You may stop using the incentive spirometer when instructed by your caregiver.  RISKS AND COMPLICATIONS Take your time so you do not get dizzy or light-headed. If you are in pain, you may need to  take or ask for pain medication before doing incentive spirometry. It is harder to take a deep breath if you are having pain. AFTER USE Rest and breathe slowly and easily. It can be helpful to keep track  of a log of your progress. Your caregiver can provide you with a simple table to help with this. If you are using the spirometer at home, follow these instructions: [place] IF:  You are having difficultly using the spirometer. You have trouble using the spirometer as often as instructed. Your pain medication is not giving enough relief while using the spirometer. You develop fever of 100.5 F (38.1 C) or higher. SEEK IMMEDIATE MEDICAL CARE IF:  You cough up bloody sputum that had not been present before. You develop fever of 102 F (38.9 C) or greater. You develop worsening pain at or near the incision site. MAKE SURE YOU:  Understand these instructions. Will watch your condition. Will get help right away if you are not doing well or get worse. Document Released: 05/18/2006 Document Revised: 03/30/2011 Document Reviewed: 07/19/2006 [facility] Patient Information 2014 [place], [place].   ________________________________________________________________________

## 2021-07-03 ENCOUNTER — Other Ambulatory Visit: Payer: Self-pay | Admitting: Nurse Practitioner

## 2021-07-03 DIAGNOSIS — I10 Essential (primary) hypertension: Secondary | ICD-10-CM

## 2021-07-03 NOTE — Telephone Encounter (Signed)
Chart Supports Rx Last OV: 05/2021 Next OV: 08/2021 

## 2021-07-07 ENCOUNTER — Ambulatory Visit: Payer: Self-pay | Admitting: Student

## 2021-07-07 NOTE — H&P (Signed)
TOTAL HIP ADMISSION H&P  Patient is admitted for left total hip arthroplasty.  Subjective:  Chief Complaint: left hip pain  HPI: Erin Bass, 61 y.o. female, has a history of pain and functional disability in the left hip(s) due to arthritis and patient has failed non-surgical conservative treatments for greater than 12 weeks to include NSAID's and/or analgesics, corticosteriod injections, flexibility and strengthening excercises, supervised PT with diminished ADL's post treatment, use of assistive devices, and activity modification.  Onset of symptoms was gradual starting 6 years ago with rapidlly worsening course since that time.The patient noted no past surgery on the left hip(s).  Patient currently rates pain in the left hip at 10 out of 10 with activity. Patient has night pain, worsening of pain with activity and weight bearing, trendelenberg gait, pain that interfers with activities of daily living, and pain with passive range of motion. Patient has evidence of subchondral cysts, subchondral sclerosis, periarticular osteophytes, and joint space narrowing by imaging studies. This condition presents safety issues increasing the risk of falls. There is no current active infection.  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/2012   Rheumatoid arthritis involving multiple sites with positive rheumatoid factor ([place]) 10/22/2011   Anemia 05/28/2011   POSTMENOPAUSAL STATUS 03/28/2009   HLD (hyperlipidemia) 01/24/2008   Essential (primary) hypertension 09/06/2006   Past Medical History:  Diagnosis  Date   Anemia    Arthritis    HTN (hypertension)     Past Surgical History:  Procedure Laterality Date   ABDOMINAL HYSTERECTOMY     Partial- Uterus only   bone spur - removal     CESAREAN SECTION     x2   PARTIAL HYSTERECTOMY  01/20/2004   fibroids; ovaries left in    REPLACEMENT TOTAL KNEE Right    TOTAL HIP ARTHROPLASTY Right 12/20/2012   Procedure: RIGHT TOTAL HIP ARTHROPLASTY ANTERIOR APPROACH;  Surgeon: Mauri Pole, MD;  Location: [facility];  Service: Orthopedics;  Laterality: Right;   TUBAL LIGATION      Current Outpatient Medications  Medication Sig Dispense Refill Last Dose   acetaminophen (TYLENOL) 500 MG tablet Take 1,500-2,000 mg by mouth every 8 (eight) hours as needed for moderate pain.      amLODipine (NORVASC) 5 MG tablet Take 1 tablet (5 mg total) by mouth daily. 90 tablet 3    celecoxib (CELEBREX) 200 MG capsule Take 200 mg by mouth 2 (two) times daily.      DULoxetine (CYMBALTA) 60 MG capsule Take 1 capsule (60 mg total) by mouth daily. 90 capsule 3    ferrous sulfate 325 (65 FE) MG tablet Take 650 mg by mouth daily with breakfast.      potassium chloride (MICRO-K) 10 MEQ CR capsule Take 1 capsule (10 mEq total) by mouth 2 (two) times daily. (Patient taking differently: Take 10 mEq by mouth daily.) 180 capsule 1    THALITONE 15 MG tablet TAKE 1 TABLET (15 MG TOTAL) BY MOUTH DAILY. 90 tablet 2    Turmeric (QC TUMERIC COMPLEX PO) Take 2 capsules by mouth daily.  No current facility-administered medications for this visit.   Allergies  Allergen Reactions   Arava [Leflunomide] Other (See Comments)    Rash  Incontinence    Humira [Adalimumab] Swelling   Methotrexate Derivatives Other (See Comments)    Elevated Liver Functions and Bruising   Rinvoq [Upadacitinib] Swelling and Other (See Comments)    Neck stiffness   Simponi [Golimumab] Rash    Social History   Tobacco Use   Smoking status: Never   Smokeless tobacco: Never  Substance Use Topics    Alcohol use: Yes    Comment: occassionally    Family History  Problem Relation Age of Onset   Heart disease Father    Anemia Mother    Depression Other        both sides of family      Review of Systems  Musculoskeletal:  Positive for arthralgias, back pain, gait problem and myalgias.  All other systems reviewed and are negative.   Objective:  Physical Exam Constitutional:      Appearance: Normal appearance.  HENT:     Head: Normocephalic and atraumatic.     Nose: Nose normal.     Mouth/Throat:     Mouth: Mucous membranes are moist.     Pharynx: Oropharynx is clear.  Eyes:     Extraocular Movements: Extraocular movements intact.  Cardiovascular:     Rate and Rhythm: Normal rate and regular rhythm.     Pulses: Normal pulses.     Heart sounds: Normal heart sounds.  Pulmonary:     Effort: Pulmonary effort is normal.     Breath sounds: Normal breath sounds.  Abdominal:     General: Abdomen is flat.     Palpations: Abdomen is soft.  Genitourinary:    Comments: deferred Musculoskeletal:     Cervical back: Normal range of motion and neck supple.     Comments: Examination of the left hip reveals no skin wounds, lesions, rashes, or erythema. Pain with flexion and rotation of the hip. Mild tenderness to palpation over the greater trochanteric region.   Sensory and motor function intact in LE bilaterally. Distal pulses 2+ bilaterally.  No significant pedal edema.  Calves soft and non-tender.   She presents in a wheelchair today.   Skin:    General: Skin is warm and dry.     Capillary Refill: Capillary refill takes less than 2 seconds.  Neurological:     General: No focal deficit present.     Mental Status: She is alert and oriented to person, place, and time.  Psychiatric:        Mood and Affect: Mood normal.        Behavior: Behavior normal.        Thought Content: Thought content normal.        Judgment: Judgment normal.     Vital signs in last 24  hours: @VSRANGES @  Labs:   Estimated body mass index is 33.18 kg/m as calculated from the following:   Height as of 06/13/21: 5\' 1"  (1.549 m).   Weight as of 06/13/21: 79.7 kg.   Imaging Review Plain radiographs demonstrate severe degenerative joint disease of the left hip(s). The bone quality appears to be adequate for age and reported activity level.      Assessment/Plan:  End stage arthritis, left hip(s)  The patient history, physical examination, clinical judgement of the provider and imaging studies are consistent with end stage degenerative joint disease of the left hip(s) and total hip arthroplasty  is deemed medically necessary. The treatment options including medical management, injection therapy, arthroscopy and arthroplasty were discussed at length. The risks and benefits of total hip arthroplasty were presented and reviewed. The risks due to aseptic loosening, infection, stiffness, dislocation/subluxation,  thromboembolic complications and other imponderables were discussed.  The patient acknowledged the explanation, agreed to proceed with the plan and consent was signed. Patient is being admitted for inpatient treatment for surgery, pain control, PT, OT, prophylactic antibiotics, VTE prophylaxis, progressive ambulation and ADL's and discharge planning.The patient is planning to be discharged home with HEP.   Therapy Plans: HEP.  Disposition: Home with husband Planned DVT Prophylaxis: aspirin 81mg  BID DME needed: Has rolling walker and cane.  PCP: Cleared TXA: Yes Allergies:  - methotrexate - liver failure. - Rinvoq - fluid build up. Anesthesia Concerns: None.  BMI: 33.1 Last HgbA1c: Not diabetic. 5.6 Other: - Cr. 0.88.  - Jehovah's witness - no blood products.  - Has history of skin reaction after surgery and delayed wound healing.  - Switching from meloxicam to celebrex.  - RA  - Hydrocodone, has celebrex, zofran, methocarbamol.    Patient's anticipated LOS is  less than 2 midnights, meeting these requirements: - Younger than 81 - Lives within 1 hour of care - Has a competent adult at home to recover with post-op recover - NO history of  - Chronic pain requiring opiods  - Diabetes  - Coronary Artery Disease  - Heart failure  - Heart attack  - Stroke  - DVT/VTE  - Cardiac arrhythmia  - Respiratory Failure/COPD  - Renal failure  - Anemia  - Advanced Liver disease

## 2021-07-07 NOTE — H&P (View-Only) (Signed)
TOTAL HIP ADMISSION H&P  Patient is admitted for left total hip arthroplasty.  Subjective:  Chief Complaint: left hip pain  HPI: Erin Bass, 61 y.o. female, has a history of pain and functional disability in the left hip(s) due to arthritis and patient has failed non-surgical conservative treatments for greater than 12 weeks to include NSAID's and/or analgesics, corticosteriod injections, flexibility and strengthening excercises, supervised PT with diminished ADL's post treatment, use of assistive devices, and activity modification.  Onset of symptoms was gradual starting 6 years ago with rapidlly worsening course since that time.The patient noted no past surgery on the left hip(s).  Patient currently rates pain in the left hip at 10 out of 10 with activity. Patient has night pain, worsening of pain with activity and weight bearing, trendelenberg gait, pain that interfers with activities of daily living, and pain with passive range of motion. Patient has evidence of subchondral cysts, subchondral sclerosis, periarticular osteophytes, and joint space narrowing by imaging studies. This condition presents safety issues increasing the risk of falls. There is no current active infection.  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/2012   Rheumatoid arthritis involving multiple sites with positive rheumatoid factor ([place]) 10/22/2011   Anemia 05/28/2011   POSTMENOPAUSAL STATUS 03/28/2009   HLD (hyperlipidemia) 01/24/2008   Essential (primary) hypertension 09/06/2006   Past Medical History:  Diagnosis  Date   Anemia    Arthritis    HTN (hypertension)     Past Surgical History:  Procedure Laterality Date   ABDOMINAL HYSTERECTOMY     Partial- Uterus only   bone spur - removal     CESAREAN SECTION     x2   PARTIAL HYSTERECTOMY  01/20/2004   fibroids; ovaries left in    REPLACEMENT TOTAL KNEE Right    TOTAL HIP ARTHROPLASTY Right 12/20/2012   Procedure: RIGHT TOTAL HIP ARTHROPLASTY ANTERIOR APPROACH;  Surgeon: Mauri Pole, MD;  Location: [facility];  Service: Orthopedics;  Laterality: Right;   TUBAL LIGATION      Current Outpatient Medications  Medication Sig Dispense Refill Last Dose   acetaminophen (TYLENOL) 500 MG tablet Take 1,500-2,000 mg by mouth every 8 (eight) hours as needed for moderate pain.      amLODipine (NORVASC) 5 MG tablet Take 1 tablet (5 mg total) by mouth daily. 90 tablet 3    celecoxib (CELEBREX) 200 MG capsule Take 200 mg by mouth 2 (two) times daily.      DULoxetine (CYMBALTA) 60 MG capsule Take 1 capsule (60 mg total) by mouth daily. 90 capsule 3    ferrous sulfate 325 (65 FE) MG tablet Take 650 mg by mouth daily with breakfast.      potassium chloride (MICRO-K) 10 MEQ CR capsule Take 1 capsule (10 mEq total) by mouth 2 (two) times daily. (Patient taking differently: Take 10 mEq by mouth daily.) 180 capsule 1    THALITONE 15 MG tablet TAKE 1 TABLET (15 MG TOTAL) BY MOUTH DAILY. 90 tablet 2    Turmeric (QC TUMERIC COMPLEX PO) Take 2 capsules by mouth daily.  No current facility-administered medications for this visit.   Allergies  Allergen Reactions   Arava [Leflunomide] Other (See Comments)    Rash  Incontinence    Humira [Adalimumab] Swelling   Methotrexate Derivatives Other (See Comments)    Elevated Liver Functions and Bruising   Rinvoq [Upadacitinib] Swelling and Other (See Comments)    Neck stiffness   Simponi [Golimumab] Rash    Social History   Tobacco Use   Smoking status: Never   Smokeless tobacco: Never  Substance Use Topics    Alcohol use: Yes    Comment: occassionally    Family History  Problem Relation Age of Onset   Heart disease Father    Anemia Mother    Depression Other        both sides of family      Review of Systems  Musculoskeletal:  Positive for arthralgias, back pain, gait problem and myalgias.  All other systems reviewed and are negative.   Objective:  Physical Exam Constitutional:      Appearance: Normal appearance.  HENT:     Head: Normocephalic and atraumatic.     Nose: Nose normal.     Mouth/Throat:     Mouth: Mucous membranes are moist.     Pharynx: Oropharynx is clear.  Eyes:     Extraocular Movements: Extraocular movements intact.  Cardiovascular:     Rate and Rhythm: Normal rate and regular rhythm.     Pulses: Normal pulses.     Heart sounds: Normal heart sounds.  Pulmonary:     Effort: Pulmonary effort is normal.     Breath sounds: Normal breath sounds.  Abdominal:     General: Abdomen is flat.     Palpations: Abdomen is soft.  Genitourinary:    Comments: deferred Musculoskeletal:     Cervical back: Normal range of motion and neck supple.     Comments: Examination of the left hip reveals no skin wounds, lesions, rashes, or erythema. Pain with flexion and rotation of the hip. Mild tenderness to palpation over the greater trochanteric region.   Sensory and motor function intact in LE bilaterally. Distal pulses 2+ bilaterally.  No significant pedal edema.  Calves soft and non-tender.   She presents in a wheelchair today.   Skin:    General: Skin is warm and dry.     Capillary Refill: Capillary refill takes less than 2 seconds.  Neurological:     General: No focal deficit present.     Mental Status: She is alert and oriented to person, place, and time.  Psychiatric:        Mood and Affect: Mood normal.        Behavior: Behavior normal.        Thought Content: Thought content normal.        Judgment: Judgment normal.     Vital signs in last 24  hours: @VSRANGES @  Labs:   Estimated body mass index is 33.18 kg/m as calculated from the following:   Height as of 06/13/21: 5\' 1"  (1.549 m).   Weight as of 06/13/21: 79.7 kg.   Imaging Review Plain radiographs demonstrate severe degenerative joint disease of the left hip(s). The bone quality appears to be adequate for age and reported activity level.      Assessment/Plan:  End stage arthritis, left hip(s)  The patient history, physical examination, clinical judgement of the provider and imaging studies are consistent with end stage degenerative joint disease of the left hip(s) and total hip arthroplasty  is deemed medically necessary. The treatment options including medical management, injection therapy, arthroscopy and arthroplasty were discussed at length. The risks and benefits of total hip arthroplasty were presented and reviewed. The risks due to aseptic loosening, infection, stiffness, dislocation/subluxation,  thromboembolic complications and other imponderables were discussed.  The patient acknowledged the explanation, agreed to proceed with the plan and consent was signed. Patient is being admitted for inpatient treatment for surgery, pain control, PT, OT, prophylactic antibiotics, VTE prophylaxis, progressive ambulation and ADL's and discharge planning.The patient is planning to be discharged home with HEP.   Therapy Plans: HEP.  Disposition: Home with husband Planned DVT Prophylaxis: aspirin 81mg  BID DME needed: Has rolling walker and cane.  PCP: Cleared TXA: Yes Allergies:  - methotrexate - liver failure. - Rinvoq - fluid build up. Anesthesia Concerns: None.  BMI: 33.1 Last HgbA1c: Not diabetic. 5.6 Other: - Cr. 0.88.  - Jehovah's witness - no blood products.  - Has history of skin reaction after surgery and delayed wound healing.  - Switching from meloxicam to celebrex.  - RA  - Hydrocodone, has celebrex, zofran, methocarbamol.    Patient's anticipated LOS is  less than 2 midnights, meeting these requirements: - Younger than 81 - Lives within 1 hour of care - Has a competent adult at home to recover with post-op recover - NO history of  - Chronic pain requiring opiods  - Diabetes  - Coronary Artery Disease  - Heart failure  - Heart attack  - Stroke  - DVT/VTE  - Cardiac arrhythmia  - Respiratory Failure/COPD  - Renal failure  - Anemia  - Advanced Liver disease

## 2021-07-08 ENCOUNTER — Ambulatory Visit: Payer: Self-pay | Admitting: Student

## 2021-07-09 ENCOUNTER — Other Ambulatory Visit: Payer: Self-pay

## 2021-07-09 ENCOUNTER — Encounter (HOSPITAL_COMMUNITY): Payer: Self-pay

## 2021-07-09 ENCOUNTER — Encounter (HOSPITAL_COMMUNITY)
Admission: RE | Admit: 2021-07-09 | Discharge: 2021-07-09 | Disposition: A | Discharge status: Home / Self Care | Payer: BC Managed Care – PPO | Source: Ambulatory Visit | Attending: Orthopedic Surgery | Admitting: Orthopedic Surgery

## 2021-07-09 VITALS — BP 148/91 | HR 81 | Temp 98.4°F | Resp 16 | Ht 61.5 in | Wt 172.0 lb

## 2021-07-09 DIAGNOSIS — Z01812 Encounter for preprocedural laboratory examination: Secondary | ICD-10-CM | POA: Insufficient documentation

## 2021-07-09 DIAGNOSIS — I10 Essential (primary) hypertension: Secondary | ICD-10-CM | POA: Insufficient documentation

## 2021-07-09 DIAGNOSIS — Z01818 Encounter for other preprocedural examination: Secondary | ICD-10-CM

## 2021-07-09 HISTORY — DX: Respiratory tuberculosis unspecified: A15.9

## 2021-07-09 HISTORY — DX: Pneumonia, unspecified organism: J18.9

## 2021-07-09 HISTORY — DX: Anxiety disorder, unspecified: F41.9

## 2021-07-09 LAB — BASIC METABOLIC PANEL
Anion gap: 10 (ref 5–15)
BUN: 23 mg/dL (ref 8–23)
CO2: 25 mmol/L (ref 22–32)
Calcium: 9.5 mg/dL (ref 8.9–10.3)
Chloride: 104 mmol/L (ref 98–111)
Creatinine, Ser: 0.9 mg/dL (ref 0.44–1.00)
GFR, Estimated: >60 mL/min (ref >60)
Glucose, Bld: 93 mg/dL (ref 70–99)
Potassium: 3.8 mmol/L (ref 3.5–5.1)
Sodium: 139 mmol/L (ref 135–145)

## 2021-07-09 LAB — SURGICAL PCR SCREEN
MRSA, PCR: NEGATIVE
Staphylococcus aureus: NEGATIVE

## 2021-07-09 LAB — NO BLOOD PRODUCTS

## 2021-07-09 LAB — CBC
HCT: 34.2 % — ABNORMAL LOW (ref 36.0–46.0)
Hemoglobin: 10.6 g/dL — ABNORMAL LOW (ref 12.0–15.0)
MCH: 23 pg — ABNORMAL LOW (ref 26.0–34.0)
MCHC: 31 g/dL (ref 30.0–36.0)
MCV: 74.2 fL — ABNORMAL LOW (ref 80.0–100.0)
Platelets: 339 10*3/uL (ref 150–400)
RBC: 4.61 MIL/uL (ref 3.87–5.11)
RDW: 15.8 % — ABNORMAL HIGH (ref 11.5–15.5)
WBC: 8.3 10*3/uL (ref 4.0–10.5)
nRBC: 0 % (ref 0.0–0.2)

## 2021-07-14 ENCOUNTER — Telehealth: Payer: Self-pay | Admitting: Nurse Practitioner

## 2021-07-14 ENCOUNTER — Other Ambulatory Visit: Payer: Self-pay

## 2021-07-14 MED ORDER — CHLORTHALIDONE 25 MG PO TABS: 12.5000 mg | ORAL_TABLET | Freq: Every day | ORAL | 2 refills | Status: AC

## 2021-07-14 MED ORDER — CHLORTHALIDONE 25 MG PO TABS: 12.5000 mg | ORAL_TABLET | Freq: Every day | ORAL | 0 refills | Status: DC

## 2021-07-17 ENCOUNTER — Ambulatory Visit (HOSPITAL_COMMUNITY): Payer: BC Managed Care – PPO

## 2021-07-17 ENCOUNTER — Encounter (HOSPITAL_COMMUNITY): Payer: Self-pay | Admitting: Orthopedic Surgery

## 2021-07-17 ENCOUNTER — Ambulatory Visit (HOSPITAL_COMMUNITY)
Admission: RE | Admit: 2021-07-17 | Discharge: 2021-07-18 | Disposition: A | Discharge status: Home / Self Care | Payer: BC Managed Care – PPO | Attending: Orthopedic Surgery | Admitting: Orthopedic Surgery

## 2021-07-17 ENCOUNTER — Ambulatory Visit (HOSPITAL_COMMUNITY): Payer: BC Managed Care – PPO | Admitting: Certified Registered"

## 2021-07-17 ENCOUNTER — Other Ambulatory Visit: Payer: Self-pay

## 2021-07-17 ENCOUNTER — Encounter (HOSPITAL_COMMUNITY)
Admission: RE | Disposition: A | Discharge status: Home / Self Care | Payer: Self-pay | Source: Home / Self Care | Attending: Orthopedic Surgery

## 2021-07-17 DIAGNOSIS — I10 Essential (primary) hypertension: Secondary | ICD-10-CM | POA: Diagnosis not present

## 2021-07-17 DIAGNOSIS — Z96642 Presence of left artificial hip joint: Secondary | ICD-10-CM

## 2021-07-17 DIAGNOSIS — D649 Anemia, unspecified: Secondary | ICD-10-CM | POA: Insufficient documentation

## 2021-07-17 DIAGNOSIS — M1612 Unilateral primary osteoarthritis, left hip: Secondary | ICD-10-CM | POA: Diagnosis not present

## 2021-07-17 DIAGNOSIS — Z01818 Encounter for other preprocedural examination: Secondary | ICD-10-CM

## 2021-07-17 DIAGNOSIS — F419 Anxiety disorder, unspecified: Secondary | ICD-10-CM | POA: Insufficient documentation

## 2021-07-17 DIAGNOSIS — Z471 Aftercare following joint replacement surgery: Secondary | ICD-10-CM | POA: Diagnosis not present

## 2021-07-17 DIAGNOSIS — Z79899 Other long term (current) drug therapy: Secondary | ICD-10-CM | POA: Insufficient documentation

## 2021-07-17 HISTORY — PX: TOTAL HIP ARTHROPLASTY: SHX124

## 2021-07-17 SURGERY — ARTHROPLASTY, HIP, TOTAL, ANTERIOR APPROACH
Anesthesia: Monitor Anesthesia Care | Site: Hip | Laterality: Left

## 2021-07-17 MED ORDER — DEXMEDETOMIDINE (PRECEDEX) IN NS 20 MCG/5ML (4 MCG/ML) IV SYRINGE
PREFILLED_SYRINGE | INTRAVENOUS | Status: DC | PRN
  Administered 2021-07-17: 12 ug via INTRAVENOUS

## 2021-07-17 MED ORDER — 0.9 % SODIUM CHLORIDE (POUR BTL) OPTIME
TOPICAL | Status: DC | PRN
  Administered 2021-07-17: 1000 mL

## 2021-07-17 MED ORDER — ONDANSETRON HCL 4 MG/2ML IJ SOLN
INTRAMUSCULAR | Status: DC | PRN
  Administered 2021-07-17: 4 mg via INTRAVENOUS

## 2021-07-17 MED ORDER — BUPIVACAINE-EPINEPHRINE (PF) 0.5% -1:200000 IJ SOLN
INTRAMUSCULAR | Status: AC
  Filled 2021-07-17: qty 30

## 2021-07-17 MED ORDER — LABETALOL HCL 5 MG/ML IV SOLN
5.0000 mg | INTRAVENOUS | Status: DC | PRN
  Administered 2021-07-17: 5 mg via INTRAVENOUS

## 2021-07-17 MED ORDER — DEXAMETHASONE SODIUM PHOSPHATE 10 MG/ML IJ SOLN
INTRAMUSCULAR | Status: DC | PRN
  Administered 2021-07-17: 10 mg via INTRAVENOUS

## 2021-07-17 MED ORDER — METHOCARBAMOL 500 MG PO TABS: 500.0000 mg | ORAL_TABLET | Freq: Four times a day (QID) | ORAL | Status: DC | PRN

## 2021-07-17 MED ORDER — SODIUM CHLORIDE (PF) 0.9 % IJ SOLN
INTRAMUSCULAR | Status: AC
  Filled 2021-07-17: qty 50

## 2021-07-17 MED ORDER — METOCLOPRAMIDE HCL 5 MG/ML IJ SOLN: 5.0000 mg | Freq: Three times a day (TID) | INTRAMUSCULAR | Status: DC | PRN

## 2021-07-17 MED ORDER — ACETAMINOPHEN 500 MG PO TABS
1000.0000 mg | ORAL_TABLET | Freq: Once | ORAL | Status: DC
  Filled 2021-07-17: qty 2

## 2021-07-17 MED ORDER — METOCLOPRAMIDE HCL 5 MG PO TABS: 5.0000 mg | ORAL_TABLET | Freq: Three times a day (TID) | ORAL | Status: DC | PRN

## 2021-07-17 MED ORDER — FENTANYL CITRATE PF 50 MCG/ML IJ SOSY
PREFILLED_SYRINGE | INTRAMUSCULAR | Status: AC
  Filled 2021-07-17: qty 2

## 2021-07-17 MED ORDER — DULOXETINE HCL 60 MG PO CPEP
60.0000 mg | ORAL_CAPSULE | Freq: Every day | ORAL | Status: DC
  Administered 2021-07-18: 60 mg via ORAL
  Filled 2021-07-17: qty 1

## 2021-07-17 MED ORDER — PHENOL 1.4 % MT LIQD: 1.0000 | OROMUCOSAL | Status: DC | PRN

## 2021-07-17 MED ORDER — LIDOCAINE HCL (PF) 2 % IJ SOLN
INTRAMUSCULAR | Status: AC
  Filled 2021-07-17: qty 5

## 2021-07-17 MED ORDER — LABETALOL HCL 5 MG/ML IV SOLN
INTRAVENOUS | Status: DC | PRN
  Administered 2021-07-17: 10 mg via INTRAVENOUS

## 2021-07-17 MED ORDER — ACETAMINOPHEN 325 MG PO TABS: 325.0000 mg | ORAL_TABLET | Freq: Four times a day (QID) | ORAL | Status: DC | PRN

## 2021-07-17 MED ORDER — ONDANSETRON HCL 4 MG/2ML IJ SOLN: 4.0000 mg | Freq: Four times a day (QID) | INTRAMUSCULAR | Status: DC | PRN

## 2021-07-17 MED ORDER — TRANEXAMIC ACID-NACL 1000-0.7 MG/100ML-% IV SOLN
1000.0000 mg | INTRAVENOUS | Status: AC
  Administered 2021-07-17: 1000 mg via INTRAVENOUS
  Filled 2021-07-17: qty 100

## 2021-07-17 MED ORDER — SODIUM CHLORIDE 0.9 % IR SOLN
Status: DC | PRN
  Administered 2021-07-17 (×2): 1000 mL

## 2021-07-17 MED ORDER — ONDANSETRON HCL 4 MG PO TABS: 4.0000 mg | ORAL_TABLET | Freq: Four times a day (QID) | ORAL | Status: DC | PRN

## 2021-07-17 MED ORDER — CEFAZOLIN SODIUM-DEXTROSE 2-4 GM/100ML-% IV SOLN
2.0000 g | INTRAVENOUS | Status: AC
  Administered 2021-07-17: 2 g via INTRAVENOUS
  Filled 2021-07-17: qty 100

## 2021-07-17 MED ORDER — LACTATED RINGERS IV SOLN: INTRAVENOUS | Status: DC

## 2021-07-17 MED ORDER — KETOROLAC TROMETHAMINE 30 MG/ML IJ SOLN
INTRAMUSCULAR | Status: AC
  Filled 2021-07-17: qty 1

## 2021-07-17 MED ORDER — CELECOXIB 200 MG PO CAPS
200.0000 mg | ORAL_CAPSULE | Freq: Two times a day (BID) | ORAL | Status: DC
  Administered 2021-07-17 – 2021-07-18 (×2): 200 mg via ORAL
  Filled 2021-07-17 (×2): qty 1

## 2021-07-17 MED ORDER — AMLODIPINE BESYLATE 5 MG PO TABS
5.0000 mg | ORAL_TABLET | Freq: Every day | ORAL | Status: DC
  Administered 2021-07-18: 5 mg via ORAL
  Filled 2021-07-17: qty 1

## 2021-07-17 MED ORDER — PROPOFOL 1000 MG/100ML IV EMUL
INTRAVENOUS | Status: AC
  Filled 2021-07-17: qty 100

## 2021-07-17 MED ORDER — CEFAZOLIN SODIUM-DEXTROSE 2-4 GM/100ML-% IV SOLN
2.0000 g | Freq: Four times a day (QID) | INTRAVENOUS | Status: AC
  Administered 2021-07-17 – 2021-07-18 (×2): 2 g via INTRAVENOUS
  Filled 2021-07-17 (×2): qty 100

## 2021-07-17 MED ORDER — SENNA 8.6 MG PO TABS
1.0000 | ORAL_TABLET | Freq: Two times a day (BID) | ORAL | Status: DC
Start: 2021-07-17 — End: 2021-07-18
  Administered 2021-07-17 – 2021-07-18 (×2): 8.6 mg via ORAL
  Filled 2021-07-17 (×2): qty 1

## 2021-07-17 MED ORDER — ISOPROPYL ALCOHOL 70 % SOLN
Status: AC
  Filled 2021-07-17: qty 480

## 2021-07-17 MED ORDER — WATER FOR IRRIGATION, STERILE IR SOLN
Status: DC | PRN
  Administered 2021-07-17: 2000 mL

## 2021-07-17 MED ORDER — SODIUM CHLORIDE 0.9 % IV SOLN: INTRAVENOUS | Status: DC

## 2021-07-17 MED ORDER — KETOROLAC TROMETHAMINE 30 MG/ML IJ SOLN
INTRAMUSCULAR | Status: DC | PRN
  Administered 2021-07-17: 30 mg

## 2021-07-17 MED ORDER — HYDROCODONE-ACETAMINOPHEN 7.5-325 MG PO TABS: 1.0000 | ORAL_TABLET | ORAL | Status: DC | PRN

## 2021-07-17 MED ORDER — ASPIRIN 81 MG PO CHEW
81.0000 mg | CHEWABLE_TABLET | Freq: Two times a day (BID) | ORAL | Status: DC
  Administered 2021-07-17 – 2021-07-18 (×2): 81 mg via ORAL
  Filled 2021-07-17 (×2): qty 1

## 2021-07-17 MED ORDER — POVIDONE-IODINE 10 % EX SWAB
2.0000 | Freq: Once | CUTANEOUS | Status: AC
  Administered 2021-07-17: 2 via TOPICAL

## 2021-07-17 MED ORDER — MORPHINE SULFATE (PF) 2 MG/ML IV SOLN: 0.5000 mg | INTRAVENOUS | Status: DC | PRN

## 2021-07-17 MED ORDER — MENTHOL 3 MG MT LOZG: 1.0000 | LOZENGE | OROMUCOSAL | Status: DC | PRN

## 2021-07-17 MED ORDER — DOCUSATE SODIUM 100 MG PO CAPS
100.0000 mg | ORAL_CAPSULE | Freq: Two times a day (BID) | ORAL | Status: DC
  Administered 2021-07-17 – 2021-07-18 (×2): 100 mg via ORAL
  Filled 2021-07-17 (×2): qty 1

## 2021-07-17 MED ORDER — DIPHENHYDRAMINE HCL 12.5 MG/5ML PO ELIX: 12.5000 mg | ORAL_SOLUTION | ORAL | Status: DC | PRN

## 2021-07-17 MED ORDER — FENTANYL CITRATE (PF) 100 MCG/2ML IJ SOLN
INTRAMUSCULAR | Status: DC | PRN
  Administered 2021-07-17: 100 ug via INTRAVENOUS

## 2021-07-17 MED ORDER — SODIUM CHLORIDE (PF) 0.9 % IJ SOLN
INTRAMUSCULAR | Status: DC | PRN
  Administered 2021-07-17: 30 mL

## 2021-07-17 MED ORDER — BUPIVACAINE IN DEXTROSE 0.75-8.25 % IT SOLN
INTRATHECAL | Status: DC | PRN
  Administered 2021-07-17: 2 mL via INTRATHECAL

## 2021-07-17 MED ORDER — ORAL CARE MOUTH RINSE: 15.0000 mL | Freq: Once | OROMUCOSAL | Status: AC

## 2021-07-17 MED ORDER — ACETAMINOPHEN 500 MG PO TABS: 1000.0000 mg | ORAL_TABLET | Freq: Once | ORAL | Status: DC

## 2021-07-17 MED ORDER — PROPOFOL 500 MG/50ML IV EMUL
INTRAVENOUS | Status: DC | PRN
  Administered 2021-07-17: 50 ug/kg/min via INTRAVENOUS

## 2021-07-17 MED ORDER — ALUM & MAG HYDROXIDE-SIMETH 200-200-20 MG/5ML PO SUSP: 30.0000 mL | ORAL | Status: DC | PRN

## 2021-07-17 MED ORDER — ONDANSETRON HCL 4 MG/2ML IJ SOLN
INTRAMUSCULAR | Status: AC
  Filled 2021-07-17: qty 2

## 2021-07-17 MED ORDER — PROPOFOL 10 MG/ML IV BOLUS
INTRAVENOUS | Status: AC
  Filled 2021-07-17: qty 20

## 2021-07-17 MED ORDER — PROPOFOL 10 MG/ML IV BOLUS
INTRAVENOUS | Status: DC | PRN
  Administered 2021-07-17 (×2): 20 mg via INTRAVENOUS

## 2021-07-17 MED ORDER — FENTANYL CITRATE (PF) 100 MCG/2ML IJ SOLN
INTRAMUSCULAR | Status: AC
  Filled 2021-07-17: qty 2

## 2021-07-17 MED ORDER — POVIDONE-IODINE 10 % EX SWAB: 2.0000 "application " | Freq: Once | CUTANEOUS | Status: DC

## 2021-07-17 MED ORDER — CHLORHEXIDINE GLUCONATE 0.12 % MT SOLN
15.0000 mL | Freq: Once | OROMUCOSAL | Status: AC
  Administered 2021-07-17: 15 mL via OROMUCOSAL

## 2021-07-17 MED ORDER — BUPIVACAINE-EPINEPHRINE (PF) 0.5% -1:200000 IJ SOLN
INTRAMUSCULAR | Status: DC | PRN
  Administered 2021-07-17: 30 mL

## 2021-07-17 MED ORDER — DEXAMETHASONE SODIUM PHOSPHATE 10 MG/ML IJ SOLN
INTRAMUSCULAR | Status: AC
  Filled 2021-07-17: qty 1

## 2021-07-17 MED ORDER — PHENYLEPHRINE HCL (PRESSORS) 10 MG/ML IV SOLN
INTRAVENOUS | Status: AC
  Filled 2021-07-17: qty 1

## 2021-07-17 MED ORDER — METHOCARBAMOL 500 MG IVPB - SIMPLE MED
INTRAVENOUS | Status: AC
  Filled 2021-07-17: qty 55

## 2021-07-17 MED ORDER — FENTANYL CITRATE PF 50 MCG/ML IJ SOSY
25.0000 ug | PREFILLED_SYRINGE | INTRAMUSCULAR | Status: DC | PRN
  Administered 2021-07-17: 50 ug via INTRAVENOUS

## 2021-07-17 MED ORDER — HYDROCODONE-ACETAMINOPHEN 5-325 MG PO TABS
1.0000 | ORAL_TABLET | ORAL | Status: DC | PRN
  Administered 2021-07-17 – 2021-07-18 (×3): 2 via ORAL
  Filled 2021-07-17 (×3): qty 2

## 2021-07-17 MED ORDER — POLYETHYLENE GLYCOL 3350 17 G PO PACK: 17.0000 g | PACK | Freq: Every day | ORAL | Status: DC | PRN

## 2021-07-17 MED ORDER — METHOCARBAMOL 500 MG IVPB - SIMPLE MED
500.0000 mg | Freq: Four times a day (QID) | INTRAVENOUS | Status: DC | PRN
  Administered 2021-07-17: 500 mg via INTRAVENOUS

## 2021-07-17 MED ORDER — MIDAZOLAM HCL 5 MG/5ML IJ SOLN
INTRAMUSCULAR | Status: DC | PRN
  Administered 2021-07-17: 2 mg via INTRAVENOUS

## 2021-07-17 MED ORDER — ISOPROPYL ALCOHOL 70 % SOLN
Status: DC | PRN
  Administered 2021-07-17: 1 via TOPICAL

## 2021-07-17 MED ORDER — MIDAZOLAM HCL 2 MG/2ML IJ SOLN
INTRAMUSCULAR | Status: AC
  Filled 2021-07-17: qty 2

## 2021-07-17 MED ORDER — CHLORTHALIDONE 25 MG PO TABS
12.5000 mg | ORAL_TABLET | Freq: Every day | ORAL | Status: DC
  Administered 2021-07-18: 12.5 mg via ORAL
  Filled 2021-07-17: qty 1

## 2021-07-17 MED ORDER — LIDOCAINE 2% (20 MG/ML) 5 ML SYRINGE
INTRAMUSCULAR | Status: DC | PRN
  Administered 2021-07-17: 100 mg via INTRAVENOUS

## 2021-07-17 MED ORDER — ONDANSETRON HCL 4 MG/2ML IJ SOLN: 4.0000 mg | Freq: Once | INTRAMUSCULAR | Status: DC | PRN

## 2021-07-17 SURGICAL SUPPLY — 64 items
ADH SKN CLS APL DERMABOND .7 (GAUZE/BANDAGES/DRESSINGS) ×1
APL PRP STRL LF DISP 70% ISPRP (MISCELLANEOUS) ×1
BAG COUNTER SPONGE SURGICOUNT (BAG) ×1 IMPLANT
BAG DECANTER FOR FLEXI CONT (MISCELLANEOUS) IMPLANT
BAG SPEC THK2 15X12 ZIP CLS (MISCELLANEOUS)
BAG SPNG CNTER NS LX DISP (BAG) ×1
BAG ZIPLOCK 12X15 (MISCELLANEOUS) IMPLANT
CHLORAPREP W/TINT 26 (MISCELLANEOUS) ×3 IMPLANT
COVER PERINEAL POST (MISCELLANEOUS) ×3 IMPLANT
COVER SURGICAL LIGHT HANDLE (MISCELLANEOUS) ×3 IMPLANT
DERMABOND ADVANCED (GAUZE/BANDAGES/DRESSINGS) ×1
DERMABOND ADVANCED .7 DNX12 (GAUZE/BANDAGES/DRESSINGS) ×4 IMPLANT
DRAPE IMP U-DRAPE 54X76 (DRAPES) ×3 IMPLANT
DRAPE SHEET LG 3/4 BI-LAMINATE (DRAPES) ×9 IMPLANT
DRAPE STERI IOBAN 125X83 (DRAPES) ×3 IMPLANT
DRAPE U-SHAPE 47X51 STRL (DRAPES) ×6 IMPLANT
DRSG AQUACEL AG ADV 3.5X10 (GAUZE/BANDAGES/DRESSINGS) ×3 IMPLANT
ELECT REM PT RETURN 15FT ADLT (MISCELLANEOUS) ×3 IMPLANT
GAUZE SPONGE 4X4 12PLY STRL (GAUZE/BANDAGES/DRESSINGS) ×3 IMPLANT
GLOVE BIO SURGEON STRL SZ8.5 (GLOVE) ×6 IMPLANT
GLOVE BIOGEL M 7.0 STRL (GLOVE) ×3 IMPLANT
GLOVE BIOGEL PI IND STRL 7.5 (GLOVE) ×2 IMPLANT
GLOVE BIOGEL PI IND STRL 8.5 (GLOVE) ×2 IMPLANT
GLOVE BIOGEL PI INDICATOR 7.5 (GLOVE) ×1
GLOVE BIOGEL PI INDICATOR 8.5 (GLOVE) ×1
GLOVE SURG LX 7.5 STRW (GLOVE) ×2
GLOVE SURG LX STRL 7.5 STRW (GLOVE) ×4 IMPLANT
GOWN SPEC L3 XXLG W/TWL (GOWN DISPOSABLE) ×3 IMPLANT
GOWN STRL REUS W/ TWL XL LVL3 (GOWN DISPOSABLE) ×2 IMPLANT
GOWN STRL REUS W/TWL XL LVL3 (GOWN DISPOSABLE) ×2
HANDPIECE INTERPULSE COAX TIP (DISPOSABLE) ×2
HEAD FEM 32X0 DELTA TI (Head) ×1 IMPLANT
HOLDER FOLEY CATH W/STRAP (MISCELLANEOUS) ×3 IMPLANT
HOOD PEEL AWAY FLYTE STAYCOOL (MISCELLANEOUS) ×9 IMPLANT
KIT TURNOVER KIT A (KITS) ×1 IMPLANT
LINER ACETAB NEUTRAL 7/C 32 (Liner) ×1 IMPLANT
MANIFOLD NEPTUNE II (INSTRUMENTS) ×3 IMPLANT
MARKER SKIN DUAL TIP RULER LAB (MISCELLANEOUS) ×3 IMPLANT
NDL SAFETY ECLIPSE 18X1.5 (NEEDLE) ×2 IMPLANT
NDL SPNL 18GX3.5 QUINCKE PK (NEEDLE) ×2 IMPLANT
NEEDLE HYPO 18GX1.5 SHARP (NEEDLE) ×2
NEEDLE SPNL 18GX3.5 QUINCKE PK (NEEDLE) ×2 IMPLANT
PACK ANTERIOR HIP CUSTOM (KITS) ×3 IMPLANT
PENCIL SMOKE EVACUATOR (MISCELLANEOUS) IMPLANT
SAW OSC TIP CART 19.5X105X1.3 (SAW) ×3 IMPLANT
SEALER BIPOLAR AQUA 6.0 (INSTRUMENTS) ×3 IMPLANT
SET HNDPC FAN SPRY TIP SCT (DISPOSABLE) ×2 IMPLANT
SHELL ACET G7 3H 48 SZC (Shell) ×1 IMPLANT
SOLUTION PRONTOSAN WOUND 350ML (IRRIGATION / IRRIGATOR) ×3 IMPLANT
SPIKE FLUID TRANSFER (MISCELLANEOUS) ×3 IMPLANT
STAPLER VISISTAT 35W (STAPLE) ×1 IMPLANT
STEM FEM SIZE 8X101MM 34.3MM (Stem) ×1 IMPLANT
SUT MNCRL AB 3-0 PS2 18 (SUTURE) ×3 IMPLANT
SUT MON AB 2-0 CT1 36 (SUTURE) ×3 IMPLANT
SUT STRATAFIX PDO 1 14 VIOLET (SUTURE) ×2
SUT STRATFX PDO 1 14 VIOLET (SUTURE) ×1
SUT VIC AB 2-0 CT1 27 (SUTURE) ×2
SUT VIC AB 2-0 CT1 TAPERPNT 27 (SUTURE) IMPLANT
SUTURE STRATFX PDO 1 14 VIOLET (SUTURE) ×2 IMPLANT
SYR 3ML LL SCALE MARK (SYRINGE) ×3 IMPLANT
TRAY FOLEY MTR SLVR 14FR STAT (SET/KITS/TRAYS/PACK) ×1 IMPLANT
TRAY FOLEY MTR SLVR 16FR STAT (SET/KITS/TRAYS/PACK) IMPLANT
TUBE SUCTION HIGH CAP CLEAR NV (SUCTIONS) ×3 IMPLANT
WATER STERILE IRR 1000ML POUR (IV SOLUTION) ×3 IMPLANT

## 2021-07-17 NOTE — Anesthesia Postprocedure Evaluation (Signed)
Anesthesia Post Note  Patient: [name]  Procedure(s) Performed: TOTAL HIP ARTHROPLASTY ANTERIOR APPROACH (Left: Hip)     Patient location during evaluation: PACU Anesthesia Type: Spinal Level of consciousness: awake, awake and alert and oriented Pain management: pain level controlled Vital Signs Assessment: post-procedure vital signs reviewed and stable Respiratory status: spontaneous breathing, nonlabored ventilation and respiratory function stable Cardiovascular status: blood pressure returned to baseline and stable Postop Assessment: no headache, no backache, spinal receding and no apparent nausea or vomiting Anesthetic complications: no   No notable events documented.  Last Vitals:  Vitals:   07/17/21 1615 07/17/21 1738  BP: (!) 160/94 (!) 162/99  Pulse: 71 80  Resp: 16 20  Temp: 36.5 C 37 C  SpO2: 100%     Last Pain:  Vitals:   07/17/21 1738  TempSrc: Oral  PainSc:                  Collene Schlichter

## 2021-07-17 NOTE — Discharge Instructions (Addendum)
 Dr. Brian Swinteck Joint Replacement Specialist [facility] [address] [place], [place] [address] [phone]   TOTAL HIP REPLACEMENT POSTOPERATIVE DIRECTIONS    Hip Rehabilitation, Guidelines Following Surgery   WEIGHT BEARING Weight bearing as tolerated with assist device (walker, cane, etc) as directed, use it as long as suggested by your surgeon or therapist, typically at least 4-6 weeks.  The results of a hip operation are greatly improved after range of motion and muscle strengthening exercises. Follow all safety measures which are given to protect your hip. If any of these exercises cause increased pain or swelling in your joint, decrease the amount until you are comfortable again. Then slowly increase the exercises. Call your caregiver if you have problems or questions.   HOME CARE INSTRUCTIONS  Most of the following instructions are designed to prevent the dislocation of your new hip.  Remove items at home which could result in a fall. This includes throw rugs or furniture in walking pathways.  Continue medications as instructed at time of discharge. You may have some home medications which will be placed on hold until you complete the course of blood thinner medication. You may start showering once you are discharged home. Do not remove your dressing. Do not put on socks or shoes without following the instructions of your caregivers.   Sit on chairs with arms. Use the chair arms to help push yourself up when arising.  Arrange for the use of a toilet seat elevator so you are not sitting low.  Walk with walker as instructed.  You may resume a sexual relationship in one month or when given the OK by your caregiver.  Use walker as long as suggested by your caregivers.  You may put full weight on your legs and walk as much as is comfortable. Avoid periods of inactivity such as sitting longer than an hour when not asleep. This helps prevent blood  clots.  You may return to work once you are cleared by your surgeon.  Do not drive a car for 6 weeks or until released by your surgeon.  Do not drive while taking narcotics.  Wear elastic stockings for two weeks following surgery during the day but you may remove then at night.  Make sure you keep all of your appointments after your operation with all of your doctors and caregivers. You should call the office at the above phone number and make an appointment for approximately two weeks after the date of your surgery. Please pick up a stool softener and laxative for home use as long as you are requiring pain medications. ICE to the affected hip every three hours for 30 minutes at a time and then as needed for pain and swelling. Continue to use ice on the hip for pain and swelling from surgery. You may notice swelling that will progress down to the foot and ankle.  This is normal after surgery.  Elevate the leg when you are not up walking on it.   It is important for you to complete the blood thinner medication as prescribed by your doctor. Continue to use the breathing machine which will help keep your temperature down.  It is common for your temperature to cycle up and down following surgery, especially at night when you are not up moving around and exerting yourself.  The breathing machine keeps your lungs expanded and your temperature down.  RANGE OF MOTION AND STRENGTHENING EXERCISES  These exercises are designed to help you   keep full movement of your hip joint. Follow your caregiver's or physical therapist's instructions. Perform all exercises about fifteen times, three times per day or as directed. Exercise both hips, even if you have had only one joint replacement. These exercises can be done on a training (exercise) mat, on the floor, on a table or on a bed. Use whatever works the best and is most comfortable for you. Use music or television while you are exercising so that the exercises are a  pleasant break in your day. This will make your life better with the exercises acting as a break in routine you can look forward to.  ?Lying on your back, slowly slide your foot toward your buttocks, raising your knee up off the floor. Then slowly slide your foot back down until your leg is straight again.  ?Lying on your back spread your legs as far apart as you can without causing discomfort.  ?Lying on your side, raise your upper leg and foot straight up from the floor as far as is comfortable. Slowly lower the leg and repeat.  ?Lying on your back, tighten up the muscle in the front of your thigh (quadriceps muscles). You can do this by keeping your leg straight and trying to raise your heel off the floor. This helps strengthen the largest muscle supporting your knee.  ?Lying on your back, tighten up the muscles of your buttocks both with the legs straight and with the knee bent at a comfortable angle while keeping your heel on the floor.  ? ?SKILLED REHAB INSTRUCTIONS: ?If the patient is transferred to a skilled rehab facility following release from the hospital, a list of the current medications will be sent to the facility for the patient to continue.  When discharged from the skilled rehab facility, please have the facility set up the patient's Home Health Physical Therapy prior to being released. Also, the skilled facility will be responsible for providing the patient with their medications at time of release from the facility to include their pain medication and their blood thinner medication. If the patient is still at the rehab facility at time of the two week follow up appointment, the skilled rehab facility will also need to assist the patient in arranging follow up appointment in our office and any transportation needs. ? ?POST-OPERATIVE OPIOID TAPER INSTRUCTIONS: ?It is important to wean off of your opioid medication as soon as possible. If you do not need pain medication after your surgery it is ok  to stop day one. ?Opioids include: ?Codeine, Hydrocodone(Norco, Vicodin), Oxycodone(Percocet, oxycontin) and hydromorphone amongst others.  ?Long term and even short term use of opiods can cause: ?Increased pain response ?Dependence ?Constipation ?Depression ?Respiratory depression ?And more.  ?Withdrawal symptoms can include ?Flu like symptoms ?Nausea, vomiting ?And more ?Techniques to manage these symptoms ?Hydrate well ?Eat regular healthy meals ?Stay active ?Use relaxation techniques(deep breathing, meditating, yoga) ?Do Not substitute Alcohol to help with tapering ?If you have been on opioids for less than two weeks and do not have pain than it is ok to stop all together.  ?Plan to wean off of opioids ?This plan should start within one week post op of your joint replacement. ?Maintain the same interval or time between taking each dose and first decrease the dose.  ?Cut the total daily intake of opioids by one tablet each day ?Next start to increase the time between doses. ?The last dose that should be eliminated is the evening dose.  ? ? ?MAKE   SURE YOU:  Understand these instructions.  Will watch your condition.  Will get help right away if you are not doing well or get worse.  Pick up stool softner and laxative for home use following surgery while on pain medications. Do not remove your dressing. The dressing is waterproof--it is OK to take showers. Continue to use ice for pain and swelling after surgery. Do not use any lotions or creams on the incision until instructed by your surgeon. Total Hip Protocol.   You make take benadryl for itching.

## 2021-07-17 NOTE — Anesthesia Procedure Notes (Addendum)
Spinal  Patient location during procedure: OR Start time: 07/17/2021 12:55 PM End time: 07/17/2021 12:58 PM Reason for block: surgical anesthesia Staffing Performed: resident/CRNA  Resident/CRNA: Sharlette Dense, CRNA Performed by: Sharlette Dense, CRNA Authorized by: Santa Lighter, MD   Preanesthetic Checklist Completed: patient identified, IV checked, site marked, risks and benefits discussed, surgical consent, monitors and equipment checked, pre-op evaluation and timeout performed Spinal Block Patient position: sitting Prep: DuraPrep and site prepped and draped Patient monitoring: heart rate, continuous pulse ox and blood pressure Approach: midline Location: L4-5 Injection technique: single-shot Needle Needle type: Pencan  Needle gauge: 24 G Needle length: 9 cm Additional Notes Kit expiration 11/13/2022 and lot [id number] Clear free flow CSF, negative heme, negative paresthesia Tolerated well and returned to supine position

## 2021-07-17 NOTE — Anesthesia Procedure Notes (Signed)
Date/Time: 07/17/2021 12:54 PM  Performed by: Florene Route, CRNAOxygen Delivery Method: Simple face mask

## 2021-07-17 NOTE — Interval H&P Note (Signed)
History and Physical Interval Note:  07/17/2021 12:46 PM  Erin Bass  has presented today for surgery, with the diagnosis of left hip osteoarthritis.  The various methods of treatment have been discussed with the patient and family. After consideration of risks, benefits and other options for treatment, the patient has consented to  Procedure(s): TOTAL HIP ARTHROPLASTY ANTERIOR APPROACH (Left) as a surgical intervention.  The patient's history has been reviewed, patient examined, no change in status, stable for surgery.  I have reviewed the patient's chart and labs.  Questions were answered to the patient's satisfaction.     Iline Oven Carrell Palmatier

## 2021-07-17 NOTE — Transfer of Care (Signed)
Immediate Anesthesia Transfer of Care Note  Patient: [name]  Procedure(s) Performed: TOTAL HIP ARTHROPLASTY ANTERIOR APPROACH (Left: Hip)  Patient Location: PACU  Anesthesia Type:Spinal  Level of Consciousness: awake and drowsy  Airway & Oxygen Therapy: Patient Spontanous Breathing and Patient connected to nasal cannula oxygen  Post-op Assessment: Report given to RN  Post vital signs: Reviewed and stable  Last Vitals:  Vitals Value Taken Time  BP 148/78 07/17/21 1502  Temp    Pulse 78 07/17/21 1504  Resp 20 07/17/21 1504  SpO2 98 % 07/17/21 1504  Vitals shown include unvalidated device data.  Last Pain:  Vitals:   07/17/21 1122  TempSrc: Oral  PainSc: 0-No pain      Patients Stated Pain Goal: 4 (07/17/21 1122)  Complications: No notable events documented.

## 2021-07-17 NOTE — Op Note (Signed)
OPERATIVE REPORT  SURGEON: Samson Frederic, MD   ASSISTANT: Clint Bolder, PA-C.  PREOPERATIVE DIAGNOSIS: Left hip arthritis.   POSTOPERATIVE DIAGNOSIS: Left hip arthritis.   PROCEDURE: Left total hip arthroplasty, anterior approach.   IMPLANTS: Biomet Taperloc Complete Microplasty stem, size 8 x 101 mm, standard offset. Biomet G7 OsseoTi Cup, size 48 mm. Biomet Vivacit-E liner, size 32 mm, C, neutral. Biomet Biolox ceramic head ball, size 32 + 0 mm.  ANESTHESIA:  Spinal  ESTIMATED BLOOD LOSS:-400 mL    ANTIBIOTICS: 2 g Ancef.  DRAINS: None.  COMPLICATIONS: None.   CONDITION: PACU - hemodynamically stable.   BRIEF CLINICAL NOTE: Erin Bass is a 61 y.o. female with a long-standing history of Left hip arthritis. After failing conservative management, the patient was indicated for total hip arthroplasty. The risks, benefits, and alternatives to the procedure were explained, and the patient elected to proceed.  PROCEDURE IN DETAIL: Surgical site was marked by myself in the pre-op holding area. Once inside the operating room, spinal anesthesia was obtained, and a foley catheter was inserted. The patient was then positioned on the Hana table.  All bony prominences were well padded.  The hip was prepped and draped in the normal sterile surgical fashion.  A time-out was called verifying side and site of surgery. The patient received IV antibiotics within 60 minutes of beginning the procedure.   Bikini incision was made, and superficial dissection was performed lateral to the ASIS. The direct anterior approach to the hip was performed through the Hueter interval.  Lateral femoral circumflex vessels were treated with the Auqumantys. The anterior capsule was exposed and an inverted T capsulotomy was made. The femoral neck cut was made to the level of the templated cut.  A corkscrew was placed into the head and the head was removed.  The femoral head was found to have eburnated bone. The  head was passed to the back table and was measured. Pubofemoral ligament was released off of the calcar, taking care to stay on bone. Superior capsule was released from the greater trochanter, taking care to stay lateral to the posterior border of the femoral neck in order to preserve the short external rotators.   Acetabular exposure was achieved, and the pulvinar and labrum were excised. Sequential reaming of the acetabulum was then performed up to a size 47 mm reamer. A 48 mm cup was then opened and impacted into place at approximately 40 degrees of abduction and 20 degrees of anteversion. The final polyethylene liner was impacted into place and acetabular osteophytes were removed.    I then gained femoral exposure taking care to protect the abductors and greater trochanter.  This was performed using standard external rotation, extension, and adduction.  A cookie cutter was used to enter the femoral canal, and then the femoral canal finder was placed.  Sequential broaching was performed up to a size 8.  Calcar planer was used on the femoral neck remnant.  I placed a standard offset neck and a trial head ball.  The hip was reduced.  Leg lengths and offset were checked fluoroscopically.  The hip was dislocated and trial components were removed.  The final implants were placed, and the hip was reduced.  Fluoroscopy was used to confirm component position and leg lengths.  At 90 degrees of external rotation and full extension, the hip was stable to an anterior directed force.   The wound was copiously irrigated with Prontosan solution and normal saline using pule lavage.  Marcaine  solution was injected into the periarticular soft tissue.  The wound was closed in layers using #1 Vicryl and V-Loc for the fascia, 2-0 Vicryl for the subcutaneous fat, 2-0 Monocryl for the deep dermal layer, and staples + Dermabond for the skin.  Once the glue was fully dried, an Aquacell Ag dressing was applied.  The patient was  transported to the recovery room in stable condition.  Sponge, needle, and instrument counts were correct at the end of the case x2.  The patient tolerated the procedure well and there were no known complications.  Please note that a surgical assistant was a medical necessity for this procedure to perform it in a safe and expeditious manner. Assistant was necessary to provide appropriate retraction of vital neurovascular structures, to prevent femoral fracture, and to allow for anatomic placement of the prosthesis.

## 2021-07-17 NOTE — Anesthesia Preprocedure Evaluation (Addendum)
Anesthesia Evaluation  Patient identified by MRN, date of birth, ID band Patient awake    Reviewed: Allergy & Precautions, NPO status , Patient's Chart, lab work & pertinent test results  Airway Mallampati: III  TM Distance: >3 FB Neck ROM: Full    Dental  (+) Dental Advisory Given, Teeth Intact   Pulmonary neg pulmonary ROS,    Pulmonary exam normal breath sounds clear to auscultation       Cardiovascular hypertension (poorly controlled, 179/100 in preop- has been changing meds around), Pt. on medications Normal cardiovascular exam Rhythm:Regular Rate:Normal     Neuro/Psych PSYCHIATRIC DISORDERS Anxiety negative neurological ROS     GI/Hepatic negative GI ROS, Neg liver ROS,   Endo/Other  negative endocrine ROS  Renal/GU negative Renal ROS  negative genitourinary   Musculoskeletal  (+) Arthritis , Osteoarthritis,    Abdominal   Peds negative pediatric ROS (+)  Hematology  (+) Blood dyscrasia, anemia , REFUSES BLOOD PRODUCTS, JEHOVAH'S WITNESSHb 10.6, plt 339 Refuses blood products for religious reasons, ok with albumin   Anesthesia Other Findings   Reproductive/Obstetrics negative OB ROS                          Anesthesia Physical Anesthesia Plan  ASA: 3  Anesthesia Plan: MAC and Spinal   Post-op Pain Management: Tylenol PO (pre-op)*   Induction:   PONV Risk Score and Plan: 3 and Treatment may vary due to age or medical condition, TIVA, Propofol infusion and Ondansetron  Airway Management Planned: Natural Airway and Simple Face Mask  Additional Equipment: None  Intra-op Plan:   Post-operative Plan:   Informed Consent: I have reviewed the patients History and Physical, chart, labs and discussed the procedure including the risks, benefits and alternatives for the proposed anesthesia with the patient or authorized representative who has indicated his/her understanding and  acceptance.     Dental advisory given  Plan Discussed with: CRNA  Anesthesia Plan Comments:        Anesthesia Quick Evaluation

## 2021-07-18 ENCOUNTER — Encounter (HOSPITAL_COMMUNITY): Payer: Self-pay | Admitting: Orthopedic Surgery

## 2021-07-18 DIAGNOSIS — F419 Anxiety disorder, unspecified: Secondary | ICD-10-CM | POA: Diagnosis not present

## 2021-07-18 DIAGNOSIS — D649 Anemia, unspecified: Secondary | ICD-10-CM | POA: Diagnosis not present

## 2021-07-18 DIAGNOSIS — Z79899 Other long term (current) drug therapy: Secondary | ICD-10-CM | POA: Diagnosis not present

## 2021-07-18 DIAGNOSIS — M1612 Unilateral primary osteoarthritis, left hip: Secondary | ICD-10-CM | POA: Diagnosis not present

## 2021-07-18 DIAGNOSIS — I10 Essential (primary) hypertension: Secondary | ICD-10-CM | POA: Diagnosis not present

## 2021-07-18 LAB — CBC
HCT: 25.6 % — ABNORMAL LOW (ref 36.0–46.0)
Hemoglobin: 7.9 g/dL — ABNORMAL LOW (ref 12.0–15.0)
MCH: 22.6 pg — ABNORMAL LOW (ref 26.0–34.0)
MCHC: 30.9 g/dL (ref 30.0–36.0)
MCV: 73.1 fL — ABNORMAL LOW (ref 80.0–100.0)
Platelets: 340 10*3/uL (ref 150–400)
RBC: 3.5 MIL/uL — ABNORMAL LOW (ref 3.87–5.11)
RDW: 15 % (ref 11.5–15.5)
WBC: 9.2 10*3/uL (ref 4.0–10.5)
nRBC: 0 % (ref 0.0–0.2)

## 2021-07-18 LAB — BASIC METABOLIC PANEL
Anion gap: 8 (ref 5–15)
BUN: 17 mg/dL (ref 8–23)
CO2: 24 mmol/L (ref 22–32)
Calcium: 8.2 mg/dL — ABNORMAL LOW (ref 8.9–10.3)
Chloride: 107 mmol/L (ref 98–111)
Creatinine, Ser: 0.81 mg/dL (ref 0.44–1.00)
GFR, Estimated: >60 mL/min (ref >60)
Glucose, Bld: 138 mg/dL — ABNORMAL HIGH (ref 70–99)
Potassium: 3.6 mmol/L (ref 3.5–5.1)
Sodium: 139 mmol/L (ref 135–145)

## 2021-07-18 MED ORDER — ONDANSETRON HCL 4 MG PO TABS: 4.0000 mg | ORAL_TABLET | Freq: Three times a day (TID) | ORAL | 0 refills | Status: DC | PRN

## 2021-07-18 MED ORDER — ASPIRIN 81 MG PO CHEW: 81.0000 mg | CHEWABLE_TABLET | Freq: Two times a day (BID) | ORAL | 0 refills | Status: AC

## 2021-07-18 MED ORDER — HYDROCODONE-ACETAMINOPHEN 10-325 MG PO TABS: 0.5000 | ORAL_TABLET | ORAL | 0 refills | Status: AC | PRN

## 2021-07-18 MED ORDER — ACETAMINOPHEN 500 MG PO TABS: 1000.0000 mg | ORAL_TABLET | Freq: Three times a day (TID) | ORAL | 0 refills | Status: DC | PRN

## 2021-07-18 MED ORDER — DOCUSATE SODIUM 100 MG PO CAPS: 100.0000 mg | ORAL_CAPSULE | Freq: Two times a day (BID) | ORAL | 0 refills | Status: AC

## 2021-07-18 MED ORDER — POLYETHYLENE GLYCOL 3350 17 G PO PACK: 17.0000 g | PACK | Freq: Every day | ORAL | 0 refills | Status: AC | PRN

## 2021-07-18 MED ORDER — SENNA 8.6 MG PO TABS: 2.0000 | ORAL_TABLET | Freq: Every day | ORAL | 0 refills | Status: AC

## 2021-07-18 MED ORDER — METHOCARBAMOL 500 MG PO TABS: 500.0000 mg | ORAL_TABLET | Freq: Four times a day (QID) | ORAL | 0 refills | Status: DC | PRN

## 2021-07-18 NOTE — Progress Notes (Signed)
    Subjective:  Patient reports pain as mild.  Denies N/V/CP/SOB/Abd pain. Patient states that she is doing really well this morning. She states that her pain has improved over 80% since having her surgery yesterday. She denies tingling and numbness in her LE bilaterally. She is eager to get home.   Objective:   VITALS:   Vitals:   07/17/21 1738 07/17/21 1924 07/17/21 2347 07/18/21 0445  BP: (!) 162/99 (!) 153/87 (!) 143/89 (!) 143/84  Pulse: 80 100 85 79  Resp: 20 16 16 16   Temp: 98.6 F (37 C) 98.4 F (36.9 C) 97.9 F (36.6 C) 97.9 F (36.6 C)  TempSrc: Oral Oral Oral Oral  SpO2:  95% 100% 100%  Weight:      Height:        Patient is sitting up in bed. NAD.  Neurologically intact ABD soft Neurovascular intact Sensation intact distally Intact pulses distally Dorsiflexion/Plantar flexion intact Incision: dressing C/D/I No cellulitis present Compartment soft She is actively moving her leg this morning.   Lab Results  Component Value Date   WBC 9.2 07/18/2021   HGB 7.9 (L) 07/18/2021   HCT 25.6 (L) 07/18/2021   MCV 73.1 (L) 07/18/2021   PLT 340 07/18/2021   BMET    Component Value Date/Time   NA 139 07/18/2021 0354   NA 143 04/04/2019 0000   NA 140 09/29/2011 1223   K 3.6 07/18/2021 0354   K 3.8 09/29/2011 1223   CL 107 07/18/2021 0354   CL 103 09/29/2011 1223   CO2 24 07/18/2021 0354   CO2 25 09/29/2011 1223   GLUCOSE 138 (H) 07/18/2021 0354   GLUCOSE 85 09/29/2011 1223   BUN 17 07/18/2021 0354   BUN 26 (A) 04/04/2019 0000   BUN 17.0 09/29/2011 1223   CREATININE 0.81 07/18/2021 0354   CREATININE 1.0 09/29/2011 1223   CALCIUM 8.2 (L) 07/18/2021 0354   CALCIUM 9.9 09/29/2011 1223   GFRNONAA >60 07/18/2021 0354     Assessment/Plan: 1 Day Post-Op   Principal Problem:   Osteoarthritis of left hip Active Problems:   Primary osteoarthritis of left hip   WBAT with walker DVT ppx: Aspirin, SCDs, TEDS PO pain control PT/OT: PT has not seen yet.  PT to come this afternoon.  Dispo: D/c home with home exercise program once cleared with PT.     07/20/2021, PA-C 07/18/2021, 7:30 AM   [facility] is now [facility] [address], [address], [place], [place] Phone: [phone] [email or web address] Facebook  [phone]

## 2021-07-18 NOTE — Evaluation (Signed)
Physical Therapy Evaluation Patient Details Name: Erin Bass MRN: [id number] DOB: 12-06-1960 Today's Date: 07/18/2021  History of Present Illness  Pt s/p L THR and with hx of RA, R TKR and R THR  Clinical Impression  Pt s/p L THR and presents with decreased L LE strength/ROM and post op pain limiting functional mobility.  Pt should progress well to dc home with family assist.     Recommendations for follow up therapy are one component of a multi-disciplinary discharge planning process, led by the attending physician.  Recommendations may be updated based on patient status, additional functional criteria and insurance authorization.  Follow Up Recommendations Follow physician's recommendations for discharge plan and follow up therapies      Assistance Recommended at Discharge Intermittent Supervision/Assistance  Patient can return home with the following  A little help with walking and/or transfers;A little help with bathing/dressing/bathroom;Assistance with cooking/housework;Assist for transportation;Help with stairs or ramp for entrance    Equipment Recommendations None recommended by PT  Recommendations for Other Services       Functional Status Assessment Patient has had a recent decline in their functional status and demonstrates the ability to make significant improvements in function in a reasonable and predictable amount of time.     Precautions / Restrictions Precautions Precautions: Fall Restrictions Weight Bearing Restrictions: No LLE Weight Bearing: Weight bearing as tolerated      Mobility  Bed Mobility Overal bed mobility: Needs Assistance Bed Mobility: Sit to Supine       Sit to supine: Min guard   General bed mobility comments: Increased time    Transfers Overall transfer level: Needs assistance Equipment used: Rolling walker (2 wheels) Transfers: Sit to/from Stand Sit to Stand: Min guard           General transfer comment: cues for LE  management and use of UEs to self assist    Ambulation/Gait Ambulation/Gait assistance: Min assist, Min guard Gait Distance (Feet): 140 Feet Assistive device: Rolling walker (2 wheels) Gait Pattern/deviations: Step-to pattern, Decreased step length - right, Decreased step length - left, Shuffle, Trunk flexed Gait velocity: decr     General Gait Details: cues for sequence, posture and position from RW  Stairs Stairs: Yes Stairs assistance: Min assist        Wheelchair Mobility    Modified Rankin (Stroke Patients Only)       Balance Overall balance assessment: Needs assistance Sitting-balance support: No upper extremity supported, Feet supported Sitting balance-Leahy Scale: Good     Standing balance support: No upper extremity supported Standing balance-Leahy Scale: Fair                               Pertinent Vitals/Pain Pain Assessment Pain Assessment: 0-10 Pain Score: 3  Pain Location: L hip Pain Descriptors / Indicators: Aching, Sore Pain Intervention(s): Limited activity within patient's tolerance, Monitored during session, Premedicated before session, Ice applied    Home Living Family/patient expects to be discharged to:: Private residence Living Arrangements: Spouse/significant other Available Help at Discharge: Family Type of Home: House Home Access: Level entry       Home Layout: One level Home Equipment: Rollator (4 wheels);Rolling Walker (2 wheels);BSC/3in1      Prior Function Prior Level of Function : Independent/Modified Independent             Mobility Comments: using RW for support       Hand Dominance  Extremity/Trunk Assessment   Upper Extremity Assessment Upper Extremity Assessment: Overall WFL for tasks assessed    Lower Extremity Assessment Lower Extremity Assessment: LLE deficits/detail    Cervical / Trunk Assessment Cervical / Trunk Assessment: Normal  Communication   Communication: No  difficulties  Cognition Arousal/Alertness: Awake/alert Behavior During Therapy: WFL for tasks assessed/performed Overall Cognitive Status: Within Functional Limits for tasks assessed                                          General Comments      Exercises     Assessment/Plan    PT Assessment Patient needs continued PT services  PT Problem List Decreased strength;Decreased range of motion;Decreased activity tolerance;Decreased balance;Decreased mobility;Pain       PT Treatment Interventions DME instruction;Gait training;Stair training;Functional mobility training;Therapeutic activities;Therapeutic exercise;Balance training;Patient/family education    PT Goals (Current goals can be found in the Care Plan section)  Acute Rehab PT Goals Patient Stated Goal: Regain IND PT Goal Formulation: With patient Time For Goal Achievement: 07/18/21 Potential to Achieve Goals: Good    Frequency 7X/week     Co-evaluation               AM-PAC PT "6 Clicks" Mobility  Outcome Measure Help needed turning from your back to your side while in a flat bed without using bedrails?: A Little Help needed moving from lying on your back to sitting on the side of a flat bed without using bedrails?: A Little Help needed moving to and from a bed to a chair (including a wheelchair)?: A Little Help needed standing up from a chair using your arms (e.g., wheelchair or bedside chair)?: A Little Help needed to walk in hospital room?: A Little Help needed climbing 3-5 steps with a railing? : A Little 6 Click Score: 18    End of Session Equipment Utilized During Treatment: Gait belt Activity Tolerance: Patient tolerated treatment well Patient left: in bed Nurse Communication: Mobility status PT Visit Diagnosis: Unsteadiness on feet (R26.81);Difficulty in walking, not elsewhere classified (R26.2)    Time: 2841-3244 PT Time Calculation (min) (ACUTE ONLY): 22 min   Charges:   PT  Evaluation $PT Eval Low Complexity: 1 Low          Mauro Kaufmann PT Acute Rehabilitation Services Pager [phone] Office [phone]   Yong Wahlquist 07/18/2021, 10:40 AM

## 2021-07-18 NOTE — Progress Notes (Signed)
Physical Therapy Treatment Patient Details Name: Erin Bass MRN: [id number] DOB: 1960-03-30 Today's Date: 07/18/2021   History of Present Illness Pt s/p L THR and with hx of RA, R TKR and R THR    PT Comments    Pt in good spirits and motivated to dc home this date.  Pt reviewed bed mobility, reviewed car transfers, and performed HEP with written instruction provided and reviewed.     Recommendations for follow up therapy are one component of a multi-disciplinary discharge planning process, led by the attending physician.  Recommendations may be updated based on patient status, additional functional criteria and insurance authorization.  Follow Up Recommendations  Follow physician's recommendations for discharge plan and follow up therapies     Assistance Recommended at Discharge Intermittent Supervision/Assistance  Patient can return home with the following A little help with walking and/or transfers;A little help with bathing/dressing/bathroom;Assistance with cooking/housework;Assist for transportation;Help with stairs or ramp for entrance   Equipment Recommendations  None recommended by PT    Recommendations for Other Services       Precautions / Restrictions Precautions Precautions: Fall Restrictions Weight Bearing Restrictions: No LLE Weight Bearing: Weight bearing as tolerated     Mobility  Bed Mobility Overal bed mobility: Needs Assistance Bed Mobility: Sit to Supine       Sit to supine: Min guard   General bed mobility comments: Increased time    Transfers Overall transfer level: Needs assistance Equipment used: Rolling walker (2 wheels) Transfers: Sit to/from Stand Sit to Stand: Min guard           General transfer comment: cues for LE management and use of UEs to self assist    Ambulation/Gait Ambulation/Gait assistance: Min guard, Supervision Gait Distance (Feet): 20 Feet Assistive device: Rolling walker (2 wheels) Gait Pattern/deviations:  Step-to pattern, Decreased step length - right, Decreased step length - left, Shuffle, Trunk flexed Gait velocity: decr     General Gait Details: min cues for sequence, posture and position from RW   Stairs Stairs: Yes Stairs assistance: Min assist         Wheelchair Mobility    Modified Rankin (Stroke Patients Only)       Balance Overall balance assessment: Needs assistance Sitting-balance support: No upper extremity supported, Feet supported Sitting balance-Leahy Scale: Good     Standing balance support: No upper extremity supported Standing balance-Leahy Scale: Fair                              Cognition Arousal/Alertness: Awake/alert Behavior During Therapy: WFL for tasks assessed/performed Overall Cognitive Status: Within Functional Limits for tasks assessed                                          Exercises Total Joint Exercises Ankle Circles/Pumps: AROM, Both, 15 reps, Supine Quad Sets: AROM, Both, 10 reps, Supine Heel Slides: AAROM, Left, 20 reps, Supine Hip ABduction/ADduction: AAROM, Left, 15 reps, Supine Long Arc Quad: AAROM, AROM, Left, 10 reps, Seated    General Comments        Pertinent Vitals/Pain Pain Assessment Pain Assessment: 0-10 Pain Score: 3  Pain Location: L hip Pain Descriptors / Indicators: Aching, Sore Pain Intervention(s): Limited activity within patient's tolerance, Monitored during session, Premedicated before session, Ice applied    Home Living Family/patient expects to be discharged  to:: Private residence Living Arrangements: Spouse/significant other Available Help at Discharge: Family Type of Home: House Home Access: Level entry       Home Layout: One level Home Equipment: Rollator (4 wheels);Rolling Walker (2 wheels);BSC/3in1      Prior Function            PT Goals (current goals can now be found in the care plan section) Acute Rehab PT Goals Patient Stated Goal: Regain IND PT  Goal Formulation: With patient Time For Goal Achievement: 07/18/21 Potential to Achieve Goals: Good Progress towards PT goals: Progressing toward goals    Frequency    7X/week      PT Plan Current plan remains appropriate    Co-evaluation              AM-PAC PT "6 Clicks" Mobility   Outcome Measure  Help needed turning from your back to your side while in a flat bed without using bedrails?: A Little Help needed moving from lying on your back to sitting on the side of a flat bed without using bedrails?: A Little Help needed moving to and from a bed to a chair (including a wheelchair)?: A Little Help needed standing up from a chair using your arms (e.g., wheelchair or bedside chair)?: A Little Help needed to walk in hospital room?: A Little Help needed climbing 3-5 steps with a railing? : A Little 6 Click Score: 18    End of Session Equipment Utilized During Treatment: Gait belt Activity Tolerance: Patient tolerated treatment well Patient left: in chair;with call bell/phone within reach Nurse Communication: Mobility status PT Visit Diagnosis: Unsteadiness on feet (R26.81);Difficulty in walking, not elsewhere classified (R26.2)     Time: 8413-2440 PT Time Calculation (min) (ACUTE ONLY): 36 min  Charges:  $Therapeutic Exercise: 8-22 mins $Therapeutic Activity: 8-22 mins                     Erin Bass PT Acute Rehabilitation Services Pager [phone] Office [phone]    Erin Bass 07/18/2021, 11:49 AM

## 2021-07-18 NOTE — TOC Transition Note (Signed)
Transition of Care [facility]) - CM/SW Discharge Note  Patient Details  Name: Erin Bass MRN: [id number] Date of Birth: March 07, 1960  [facility] CM/SW Contact:  Sherie Don, LCSW Phone Number: 07/18/2021, 11:35 AM  Clinical Narrative: Patient is expected to discharge home after working with PT. CSW met with patient to confirm discharge plan. Patient will go home with a home exercise program (HEP). Patient has a rolling walker and cane at home, so there are no DME needs at this time. TOC signing off.  Final next level of care: Home/Self Care Barriers to Discharge: No Barriers Identified  Patient Goals and CMS Choice Patient states their goals for this hospitalization and ongoing recovery are:: Discharge home with HEP Choice offered to / list presented to : NA  Discharge Plan and Services          DME Arranged: N/A DME Agency: NA  Readmission Risk Interventions     No data to display

## 2021-07-18 NOTE — Plan of Care (Signed)
Problem: Education: Goal: Knowledge of General Education information will improve Description: Including pain rating scale, medication(s)/side effects and non-pharmacologic comfort measures Outcome: Progressing   Problem: Clinical Measurements: Goal: Ability to maintain clinical measurements within normal limits will improve Outcome: Progressing   Problem: Activity: Goal: Risk for activity intolerance will decrease Outcome: Progressing   Haydee Salter, RN 07/18/21 10:07 AM

## 2021-07-18 NOTE — Plan of Care (Signed)
Patient discharging home via private vehicle with husband. Haydee Salter, RN 07/18/21 1:03 PM

## 2021-07-28 DIAGNOSIS — Z4789 Encounter for other orthopedic aftercare: Secondary | ICD-10-CM | POA: Diagnosis not present

## 2021-09-03 ENCOUNTER — Telehealth: Payer: Self-pay | Admitting: Nurse Practitioner

## 2021-09-03 NOTE — Telephone Encounter (Signed)
Pt is having some swelling in her wrist after her surgery. She would like to have a script of prednisone the 10 day taper. Please call.

## 2021-09-08 NOTE — Telephone Encounter (Signed)
Appt being scheduled for 2pm tomorrow 8/22

## 2021-09-08 NOTE — Telephone Encounter (Signed)
Left VM, adv pt to call back  

## 2021-09-09 ENCOUNTER — Encounter: Payer: Self-pay | Admitting: Nurse Practitioner

## 2021-09-09 ENCOUNTER — Telehealth (INDEPENDENT_AMBULATORY_CARE_PROVIDER_SITE_OTHER): Payer: BC Managed Care – PPO | Admitting: Nurse Practitioner

## 2021-09-09 DIAGNOSIS — G894 Chronic pain syndrome: Secondary | ICD-10-CM

## 2021-09-09 DIAGNOSIS — M069 Rheumatoid arthritis, unspecified: Secondary | ICD-10-CM | POA: Diagnosis not present

## 2021-09-09 DIAGNOSIS — M0579 Rheumatoid arthritis with rheumatoid factor of multiple sites without organ or systems involvement: Secondary | ICD-10-CM

## 2021-09-09 MED ORDER — PREDNISONE 10 MG PO TABS: ORAL_TABLET | ORAL | 0 refills | Status: DC

## 2021-09-09 MED ORDER — DULOXETINE HCL 60 MG PO CPEP: 60.0000 mg | ORAL_CAPSULE | Freq: Every day | ORAL | 3 refills | Status: AC

## 2021-09-09 NOTE — Assessment & Plan Note (Signed)
Wrist pain, swelling and stiffness x 39months Denies any injury or redness or fever or effusion. Declines to schedule appt with [facility] Rheumatology: reports multiple drug reactions and not pleased with care. I offered to enter referral to [facility] rheumatology, but she declined.  Sent tapered prednisone 10mg  x 10days. Advised to schedule appt with rheumatology to discuss long term use of oral prednisone.

## 2021-09-09 NOTE — Progress Notes (Signed)
Virtual Visit via Video Note  I connected withNAME@ on 09/09/21 at  2:00 PM EDT by a video enabled telemedicine application and verified that I am speaking with the correct person using two identifiers.  Location: Patient:Home Provider: Office Participants: patient and provider  I discussed the limitations of evaluation and management by telemedicine and the availability of in person appointments. I also discussed with the patient that there may be a patient responsible charge related to this service. The patient expressed understanding and agreed to proceed.  IO:NGEXB on chronic wrist pain and stiffness  History of Present Illness:  Rheumatoid arthritis involving multiple sites with positive rheumatoid factor (HCC) Wrist pain, swelling and stiffness x 53months Denies any injury or redness or fever or effusion. Declines to schedule appt with [facility] Rheumatology: reports multiple drug reactions and not pleased with care. I offered to enter referral to [facility] rheumatology, but she declined.  Sent tapered prednisone 10mg  x 10days. Advised to schedule appt with rheumatology to discuss long term use of oral prednisone.  Observations/Objective: Physical Exam Vitals reviewed.  Pulmonary:     Effort: Pulmonary effort is normal.  Musculoskeletal:     Right wrist: Swelling and deformity present. No effusion. Decreased range of motion.     Left wrist: Swelling and deformity present. No effusion. Decreased range of motion.  Skin:    Findings: No erythema.  Neurological:     Mental Status: She is alert.     Assessment and Plan: Sanah was seen today for acute visit.  Diagnoses and all orders for this visit:  Rheumatoid arthritis involving multiple joints (HCC) -     predniSONE (DELTASONE) 10 MG tablet; 4tabs daily x1day, then 3tabs daily x 2days, then 2tabs daily x 3days, then 1tab daily x 4days.  Chronic pain syndrome -     DULoxetine (CYMBALTA) 60 MG capsule; Take 1 capsule (60  mg total) by mouth daily.   Follow Up Instructions: See instructions above   I discussed the assessment and treatment plan with the patient. The patient was provided an opportunity to ask questions and all were answered. The patient agreed with the plan and demonstrated an understanding of the instructions.   The patient was advised to call back or seek an in-person evaluation if the symptoms worsen or if the condition fails to improve as anticipated.  Agustin Cree, NP

## 2021-09-15 ENCOUNTER — Ambulatory Visit (INDEPENDENT_AMBULATORY_CARE_PROVIDER_SITE_OTHER): Payer: BC Managed Care – PPO | Admitting: Nurse Practitioner

## 2021-09-15 ENCOUNTER — Encounter: Payer: Self-pay | Admitting: Nurse Practitioner

## 2021-09-15 VITALS — BP 130/89 | HR 81 | Temp 97.5°F | Ht 61.0 in | Wt 175.4 lb

## 2021-09-15 DIAGNOSIS — I499 Cardiac arrhythmia, unspecified: Secondary | ICD-10-CM | POA: Diagnosis not present

## 2021-09-15 DIAGNOSIS — E7800 Pure hypercholesterolemia, unspecified: Secondary | ICD-10-CM | POA: Diagnosis not present

## 2021-09-15 DIAGNOSIS — D509 Iron deficiency anemia, unspecified: Secondary | ICD-10-CM | POA: Diagnosis not present

## 2021-09-15 DIAGNOSIS — Z0001 Encounter for general adult medical examination with abnormal findings: Secondary | ICD-10-CM

## 2021-09-15 NOTE — Progress Notes (Signed)
Complete physical exam  Patient: Erin Bass   DOB: [birth year]   61 y.o. Female  MRN: [id number] Visit Date: 09/15/2021  Subjective:    Chief Complaint  Patient presents with   Annual Exam    CPE, no concerns not fasting.    Erin Bass is a 61 y.o. female who presents today for a complete physical exam. She reports consuming a low fat and low sodium diet.  Limited due to RA  She generally feels fairly well. She reports sleeping fairly well. She does not have additional problems to discuss today.  Vision:Yes Dental:No STD Screen:No  Most recent fall risk assessment:    09/15/2021    8:29 AM  Fall Risk   Falls in the past year? 0  Number falls in past yr: 0  Injury with Fall? 0   Most recent depression screenings:    09/15/2021    9:26 AM 09/15/2021    8:29 AM  PHQ 2/9 Scores  PHQ - 2 Score 0 0  PHQ- 9 Score 2    HPI  HLD (hyperlipidemia) Repeat lipid panel  Microcytic anemia Denies any blood in stool Repeat cbc and iron panel   Past Medical History:  Diagnosis Date   Anemia    Anxiety    Arthritis    HTN (hypertension)    Pneumonia    Tuberculosis    9 months of medication   Past Surgical History:  Procedure Laterality Date   ABDOMINAL HYSTERECTOMY     Partial- Uterus only   bone spur - removal     CESAREAN SECTION     x2   PARTIAL HYSTERECTOMY  01/20/2004   fibroids; ovaries left in    REPLACEMENT TOTAL KNEE Right    TOTAL HIP ARTHROPLASTY Right 12/20/2012   Procedure: RIGHT TOTAL HIP ARTHROPLASTY ANTERIOR APPROACH;  Surgeon: Shelda Pal, MD;  Location: [facility];  Service: Orthopedics;  Laterality: Right;   TOTAL HIP ARTHROPLASTY Left 07/17/2021   Procedure: TOTAL HIP ARTHROPLASTY ANTERIOR APPROACH;  Surgeon: Samson Frederic, MD;  Location: [facility];  Service: Orthopedics;  Laterality: Left;   TUBAL LIGATION     Social History   Socioeconomic History   Marital status: Married    Spouse name: Not on file   Number of children: Not on file    Years of education: Not on file   Highest education level: Not on file  Occupational History   Not on file  Tobacco Use   Smoking status: Never   Smokeless tobacco: Never  Vaping Use   Vaping Use: Never used  Substance and Sexual Activity   Alcohol use: Yes    Comment: occassionally   Drug use: Never   Sexual activity: Not Currently  Other Topics Concern   Not on file  Social History Narrative   Married; 2 children, 1 at home.    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   Social Determinants of Health   Financial Resource Strain: Not on file  Food Insecurity: Not on file  Transportation Needs: Not on file  Physical Activity: Not on file  Stress: Not on file  Social Connections: Not on file  Intimate Partner Violence: Not on file   Family Status  Relation Name Status   Father  Alive       CAD/bypass, depression, ETOH abuse    Mother  Alive       DM, HBP   Brother  Deceased at age 59  massive MI, taking phen-phen, deprerssion   Brother  (Not Specified)       L&W   Sister  (Not Specified)       45; DM, depression    PGM  (Not Specified)       stroke   MGM  (Not Specified)       DM   Mat Aunt  (Not Specified)       DM   Mat Uncle  (Not Specified)       DM    Other  (Not Specified)   Family History  Problem Relation Age of Onset   Heart disease Father    Anemia Mother    Depression Other        both sides of family    Allergies  Allergen Reactions   Arava [Leflunomide] Other (See Comments)    Rash  Incontinence    Humira [Adalimumab] Swelling   Methotrexate Derivatives Other (See Comments)    Elevated Liver Functions and Bruising   Rinvoq [Upadacitinib] Swelling and Other (See Comments)    Neck stiffness   Simponi [Golimumab] Rash    Patient Care Team: Anne Ng, NP as PCP - General (Internal Medicine)   Medications: Outpatient Medications Prior to Visit  Medication Sig   amLODipine (NORVASC) 5 MG tablet Take 1 tablet (5  mg total) by mouth daily.   chlorthalidone (HYGROTON) 25 MG tablet Take 0.5 tablets (12.5 mg total) by mouth daily.   DULoxetine (CYMBALTA) 60 MG capsule Take 1 capsule (60 mg total) by mouth daily.   ferrous sulfate 325 (65 FE) MG tablet Take 650 mg by mouth daily with breakfast.   naproxen sodium (ALEVE) 220 MG tablet Take 220 mg by mouth 2 (two) times daily.   potassium chloride (MICRO-K) 10 MEQ CR capsule Take 10 mEq by mouth 2 (two) times daily.   predniSONE (DELTASONE) 10 MG tablet 4tabs daily x1day, then 3tabs daily x 2days, then 2tabs daily x 3days, then 1tab daily x 4days.   Turmeric (QC TUMERIC COMPLEX PO) Take 2 capsules by mouth daily.   [DISCONTINUED] celecoxib (CELEBREX) 200 MG capsule Take 200 mg by mouth daily at 12 noon. (Patient not taking: Reported on 09/15/2021)   [DISCONTINUED] potassium chloride (MICRO-K) 10 MEQ CR capsule Take 1 capsule (10 mEq total) by mouth 2 (two) times daily. (Patient not taking: Reported on 09/15/2021)   No facility-administered medications prior to visit.    Review of Systems  Constitutional:  Negative for fever.  HENT:  Negative for congestion and sore throat.   Eyes:        Negative for visual changes  Respiratory:  Negative for cough and shortness of breath.   Cardiovascular:  Negative for chest pain, palpitations and leg swelling.  Gastrointestinal:  Negative for blood in stool, constipation and diarrhea.  Genitourinary:  Negative for dysuria, frequency and urgency.  Musculoskeletal:  Positive for arthralgias and gait problem. Negative for myalgias.       Due to advance OA and RA  Skin:  Negative for rash.  Neurological:  Negative for dizziness and headaches.  Hematological:  Does not bruise/bleed easily.  Psychiatric/Behavioral:  Negative for suicidal ideas. The patient is not nervous/anxious.    Last CBC Lab Results  Component Value Date   WBC 9.2 07/18/2021   HGB 7.9 (L) 07/18/2021   HCT 25.6 (L) 07/18/2021   MCV 73.1 (L)  07/18/2021   MCH 22.6 (L) 07/18/2021   RDW 15.0 07/18/2021   PLT 340  07/18/2021   Last metabolic panel Lab Results  Component Value Date   GLUCOSE 138 (H) 07/18/2021   NA 139 07/18/2021   K 3.6 07/18/2021   CL 107 07/18/2021   CO2 24 07/18/2021   BUN 17 07/18/2021   CREATININE 0.81 07/18/2021   GFRNONAA >60 07/18/2021   CALCIUM 8.2 (L) 07/18/2021   PROT 8.1 06/13/2021   ALBUMIN 4.1 06/13/2021   BILITOT 0.3 06/13/2021   ALKPHOS 81 06/13/2021   AST 16 06/13/2021   ALT 9 06/13/2021   ANIONGAP 8 07/18/2021   Last hemoglobin A1c Lab Results  Component Value Date   HGBA1C 5.6 06/13/2021   Last thyroid functions Lab Results  Component Value Date   TSH 0.94 09/12/2020      Objective:  BP 130/89 (BP Location: Right Arm, Patient Position: Sitting, Cuff Size: Normal)   Pulse 81   Temp (!) 97.5 F (36.4 C) (Oral)   Ht 5\' 1"  (1.549 m)   Wt 175 lb 6.4 oz (79.6 kg)   SpO2 96%   BMI 33.14 kg/m    ECG: NSR, no ectopic beat noted, no change compared to ECG on 06/13/2021  BP Readings from Last 3 Encounters:  09/15/21 130/89  07/18/21 (!) 143/81  07/09/21 (!) 148/91   Wt Readings from Last 3 Encounters:  09/15/21 175 lb 6.4 oz (79.6 kg)  07/17/21 172 lb (78 kg)  07/09/21 172 lb (78 kg)   Physical Exam Constitutional:      General: She is not in acute distress.    Appearance: She is obese.  HENT:     Right Ear: Tympanic membrane, ear canal and external ear normal.     Left Ear: Tympanic membrane, ear canal and external ear normal.     Nose: Nose normal.  Eyes:     General: No scleral icterus.    Extraocular Movements: Extraocular movements intact.     Conjunctiva/sclera: Conjunctivae normal.  Cardiovascular:     Rate and Rhythm: Normal rate. Rhythm irregular.     Pulses: Normal pulses.     Heart sounds: Normal heart sounds.  Pulmonary:     Effort: Pulmonary effort is normal. No respiratory distress.     Breath sounds: Normal breath sounds.  Abdominal:      General: Bowel sounds are normal. There is no distension.     Palpations: Abdomen is soft.  Musculoskeletal:        General: Normal range of motion.     Cervical back: Normal range of motion and neck supple.     Right lower leg: No edema.     Left lower leg: No edema.  Lymphadenopathy:     Cervical: No cervical adenopathy.  Skin:    General: Skin is warm and dry.  Neurological:     Mental Status: She is alert and oriented to person, place, and time.  Psychiatric:        Mood and Affect: Mood normal.        Behavior: Behavior normal.        Thought Content: Thought content normal.     No results found for any visits on 09/15/21.    Assessment & Plan:    Routine Health Maintenance and Physical Exam  Immunization History  Administered Date(s) Administered   Janssen (J&J) SARS-COV-2 Vaccination 08/03/2019   PNEUMOCOCCAL CONJUGATE-20 09/12/2020   Pneumococcal Polysaccharide-23 10/22/2011   Td 04/19/2004    Health Maintenance  Topic Date Due   COVID-19 Vaccine (2 - Janssen risk series)  09/25/2021 (Originally 08/31/2019)   Zoster Vaccines- Shingrix (1 of 2) 12/16/2021 (Originally 01/21/1979)   INFLUENZA VACCINE  04/19/2022 (Originally 08/19/2021)   TETANUS/TDAP  09/16/2022 (Originally 04/20/2014)   MAMMOGRAM  06/25/2022   COLONOSCOPY (Pts 45-56yrs Insurance coverage will need to be confirmed)  09/01/2023   HIV Screening  Completed   HPV VACCINES  Aged Out   PAP SMEAR-Modifier  Discontinued   Hepatitis C Screening  Discontinued   Discussed health benefits of physical activity, and encouraged her to engage in regular exercise appropriate for her age and condition.  Problem List Items Addressed This Visit       Other   HLD (hyperlipidemia)    Repeat lipid panel      Relevant Orders   Lipid panel   Microcytic anemia    Denies any blood in stool Repeat cbc and iron panel      Relevant Orders   IBC + Ferritin   Other Visit Diagnoses     Encounter for preventative adult  health care exam with abnormal findings    -  Primary   Relevant Orders   CBC   Comprehensive metabolic panel   Irregular heart rhythm       Relevant Orders   EKG 12-Lead (Completed)   TSH   TSH      Return in about 6 months (around 03/18/2022) for HTN, hyperlipidemia (fasting).     [name], NP

## 2021-09-15 NOTE — Patient Instructions (Addendum)
Schedule appt for mammogram. Schedule appt for fasting lab. Need to be fasting 8hrs prior to blood draw. Ok to drink water. Normal ECG.  Maintain current medications

## 2021-09-15 NOTE — Assessment & Plan Note (Signed)
Repeat lipid panel ?

## 2021-09-15 NOTE — Assessment & Plan Note (Addendum)
Denies any blood in stool Last colonoscopy 2015: 21mm polyp removed. Advised to repeat in 5-62yrs. Repeat cbc and iron panel

## 2021-09-18 ENCOUNTER — Other Ambulatory Visit (INDEPENDENT_AMBULATORY_CARE_PROVIDER_SITE_OTHER): Payer: BC Managed Care – PPO

## 2021-09-18 DIAGNOSIS — D509 Iron deficiency anemia, unspecified: Secondary | ICD-10-CM

## 2021-09-18 DIAGNOSIS — I499 Cardiac arrhythmia, unspecified: Secondary | ICD-10-CM

## 2021-09-18 DIAGNOSIS — Z0001 Encounter for general adult medical examination with abnormal findings: Secondary | ICD-10-CM

## 2021-09-18 DIAGNOSIS — E7800 Pure hypercholesterolemia, unspecified: Secondary | ICD-10-CM

## 2021-09-18 LAB — LIPID PANEL
Cholesterol: 282 mg/dL — ABNORMAL HIGH (ref 0–200)
HDL: 104 mg/dL (ref >39.00)
LDL Cholesterol: 154 mg/dL — ABNORMAL HIGH (ref 0–99)
NonHDL: 178.44
Total CHOL/HDL Ratio: 3
Triglycerides: 123 mg/dL (ref 0.0–149.0)
VLDL: 24.6 mg/dL (ref 0.0–40.0)

## 2021-09-18 LAB — COMPREHENSIVE METABOLIC PANEL
ALT: 8 U/L (ref 0–35)
AST: 12 U/L (ref 0–37)
Albumin: 4.1 g/dL (ref 3.5–5.2)
Alkaline Phosphatase: 81 U/L (ref 39–117)
BUN: 17 mg/dL (ref 6–23)
CO2: 28 mEq/L (ref 19–32)
Calcium: 9.6 mg/dL (ref 8.4–10.5)
Chloride: 98 mEq/L (ref 96–112)
Creatinine, Ser: 0.81 mg/dL (ref 0.40–1.20)
GFR: 78.22 mL/min (ref >60.00)
Glucose, Bld: 85 mg/dL (ref 70–99)
Potassium: 3.6 mEq/L (ref 3.5–5.1)
Sodium: 136 mEq/L (ref 135–145)
Total Bilirubin: 0.3 mg/dL (ref 0.2–1.2)
Total Protein: 7.9 g/dL (ref 6.0–8.3)

## 2021-09-18 LAB — CBC
HCT: 33.1 % — ABNORMAL LOW (ref 36.0–46.0)
Hemoglobin: 10.2 g/dL — ABNORMAL LOW (ref 12.0–15.0)
MCHC: 30.8 g/dL (ref 30.0–36.0)
MCV: 71.9 fl — ABNORMAL LOW (ref 78.0–100.0)
Platelets: 380 10*3/uL (ref 150.0–400.0)
RBC: 4.6 Mil/uL (ref 3.87–5.11)
RDW: 17 % — ABNORMAL HIGH (ref 11.5–15.5)
WBC: 8.7 10*3/uL (ref 4.0–10.5)

## 2021-09-18 LAB — IBC + FERRITIN
Ferritin: 147.1 ng/mL (ref 10.0–291.0)
Iron: 41 ug/dL — ABNORMAL LOW (ref 42–145)
Saturation Ratios: 13.9 % — ABNORMAL LOW (ref 20.0–50.0)
TIBC: 294 ug/dL (ref 250.0–450.0)
Transferrin: 210 mg/dL — ABNORMAL LOW (ref 212.0–360.0)

## 2021-09-18 LAB — TSH: TSH: 2.35 u[IU]/mL (ref 0.35–5.50)

## 2021-09-18 NOTE — Addendum Note (Signed)
Addended by: Michaela Corner on: 09/18/2021 02:39 PM   Modules accepted: Orders

## 2021-11-02 NOTE — Progress Notes (Unsigned)
    Kalimah Capurro T. Bosten Newstrom, MD, [place] at [facility] [place], [address]  Phone: [phone]  FAX: [phone]  Erin Bass - 61 y.o. female  MRN [id number]  Date of Birth: 18-Apr-1960  Date: 11/03/2021  PCP: Flossie Buffy, NP  Referral: Flossie Buffy, NP  No chief complaint on file.  Subjective:   Erin Bass is a 61 y.o. very pleasant female patient with There is no height or weight on file to calculate BMI. who presents with the following:  Pleasant patient with RA who presents with knee pain:  Review of Systems is noted in the HPI, as appropriate  Objective:   There were no vitals taken for this visit.  GEN: No acute distress; alert,appropriate. PULM: Breathing comfortably in no respiratory distress PSYCH: Normally interactive.   Laboratory and Imaging Data:  Assessment and Plan:   ***

## 2021-11-03 ENCOUNTER — Encounter: Payer: Self-pay | Admitting: Family Medicine

## 2021-11-03 ENCOUNTER — Ambulatory Visit (INDEPENDENT_AMBULATORY_CARE_PROVIDER_SITE_OTHER): Payer: BC Managed Care – PPO | Admitting: Family Medicine

## 2021-11-03 VITALS — BP 100/80 | HR 150 | Temp 98.2°F | Ht 61.0 in | Wt 180.0 lb

## 2021-11-03 DIAGNOSIS — R000 Tachycardia, unspecified: Secondary | ICD-10-CM | POA: Diagnosis not present

## 2021-11-03 DIAGNOSIS — M1712 Unilateral primary osteoarthritis, left knee: Secondary | ICD-10-CM

## 2021-11-03 DIAGNOSIS — M069 Rheumatoid arthritis, unspecified: Secondary | ICD-10-CM

## 2021-11-03 MED ORDER — TRIAMCINOLONE ACETONIDE 40 MG/ML IJ SUSP
40.0000 mg | Freq: Once | INTRAMUSCULAR | Status: AC
  Administered 2021-11-03: 40 mg via INTRA_ARTICULAR

## 2021-12-16 ENCOUNTER — Ambulatory Visit: Payer: BC Managed Care – PPO | Admitting: Nurse Practitioner

## 2021-12-25 ENCOUNTER — Encounter: Payer: Self-pay | Admitting: Family Medicine

## 2021-12-25 MED ORDER — PREDNISONE 20 MG PO TABS: ORAL_TABLET | ORAL | 0 refills | Status: DC

## 2022-01-25 NOTE — Progress Notes (Unsigned)
    Athziri Freundlich T. Pinchas Reither, MD, [place] at [facility] [place], [address]  Phone: [phone]  FAX: [phone]  Erin Bass - 62 y.o. female  MRN [id number]  Date of Birth: [birth year]  Date: 01/26/2022  PCP: Flossie Buffy, NP  Referral: Flossie Buffy, NP  No chief complaint on file.  Subjective:   Erin Bass is a 62 y.o. very pleasant female patient with There is no height or weight on file to calculate BMI. who presents with the following:  Patient presents with L knee pain and OA and RA:   I last injected her knee in 10/2021:     Review of Systems is noted in the HPI, as appropriate  Objective:   There were no vitals taken for this visit.  GEN: No acute distress; alert,appropriate. PULM: Breathing comfortably in no respiratory distress PSYCH: Normally interactive.   Laboratory and Imaging Data:  Assessment and Plan:   ***

## 2022-01-26 ENCOUNTER — Ambulatory Visit (INDEPENDENT_AMBULATORY_CARE_PROVIDER_SITE_OTHER): Payer: Medicare Other | Admitting: Family Medicine

## 2022-01-26 ENCOUNTER — Encounter: Payer: Self-pay | Admitting: Family Medicine

## 2022-01-26 VITALS — BP 130/78 | HR 101 | Temp 99.1°F | Ht 61.0 in | Wt 182.0 lb

## 2022-01-26 DIAGNOSIS — M1712 Unilateral primary osteoarthritis, left knee: Secondary | ICD-10-CM

## 2022-01-26 DIAGNOSIS — M069 Rheumatoid arthritis, unspecified: Secondary | ICD-10-CM

## 2022-01-26 MED ORDER — TRIAMCINOLONE ACETONIDE 40 MG/ML IJ SUSP
40.0000 mg | Freq: Once | INTRAMUSCULAR | Status: AC
  Administered 2022-01-26: 40 mg via INTRA_ARTICULAR

## 2022-01-26 NOTE — Addendum Note (Signed)
Addended by: Carter Kitten on: 01/26/2022 02:34 PM   Modules accepted: Orders

## 2022-03-07 ENCOUNTER — Other Ambulatory Visit: Payer: Self-pay | Admitting: Nurse Practitioner

## 2022-03-17 ENCOUNTER — Telehealth: Payer: Self-pay | Admitting: Nurse Practitioner

## 2022-03-17 NOTE — Telephone Encounter (Signed)
Pt called to cancel appt 03/18/22 stating she has already seen and est with a new provider. Requested to remove Wilfred Lacy, NP as PCP.

## 2022-03-18 ENCOUNTER — Ambulatory Visit: Payer: Medicare Other | Admitting: Nurse Practitioner

## 2022-03-21 ENCOUNTER — Other Ambulatory Visit: Payer: Self-pay | Admitting: Family Medicine

## 2022-04-20 DIAGNOSIS — Z1231 Encounter for screening mammogram for malignant neoplasm of breast: Secondary | ICD-10-CM | POA: Diagnosis not present

## 2022-05-05 ENCOUNTER — Other Ambulatory Visit: Payer: Self-pay | Admitting: Nurse Practitioner

## 2022-05-05 DIAGNOSIS — I10 Essential (primary) hypertension: Secondary | ICD-10-CM

## 2022-05-07 ENCOUNTER — Other Ambulatory Visit: Payer: Self-pay | Admitting: Nurse Practitioner

## 2022-05-07 DIAGNOSIS — I10 Essential (primary) hypertension: Secondary | ICD-10-CM

## 2022-05-20 NOTE — Progress Notes (Signed)
    Erin Bass T. Erin Sando, MD, CA[facility] Sports Medicine [facility] at [facility] [address] [place], [address]  Phone: [phone]  FAX: [phone]  Erin Bass - 62 y.o. female  MRN [id number]  Date of Birth: 10/06/1960  Date: 05/21/2022  PCP: Rosita Fire, NP  Referral: Rosita Fire, NP  Chief Complaint  Patient presents with   Knee Pain    C/o L knee pain. Here for injection.    Subjective:   Erin Bass is a 62 y.o. very pleasant female patient with Body mass index is 35.36 kg/m. who presents with the following:  Procedure only  Aspiration/Injection Procedure Note Erin Bass Sep 02, 1960 Date of procedure: 05/21/2022  Procedure: Large Joint Aspiration / Injection of Knee, L Indications: Pain  Procedure Details Patient verbally consented to procedure. Risks, benefits, and alternatives explained. Sterilely prepped with Chloraprep. Ethyl cholride used for anesthesia. 9 cc Lidocaine 1% mixed with 1 mL of Kenalog 40 mg injected using the anteromedial approach without difficulty. No complications with procedure and tolerated well. Patient had decreased pain post-injection. Medication: 1 mL of Kenalog 40 mg     ICD-10-CM   1. Chronic pain of right knee  M25.561    G89.29     2. Rheumatoid arthritis involving multiple sites with positive rheumatoid factor (HCC)  M05.79

## 2022-05-21 ENCOUNTER — Encounter: Payer: Self-pay | Admitting: Family Medicine

## 2022-05-21 ENCOUNTER — Ambulatory Visit (INDEPENDENT_AMBULATORY_CARE_PROVIDER_SITE_OTHER): Payer: Medicare Other | Admitting: Family Medicine

## 2022-05-21 VITALS — BP 138/98 | HR 97 | Temp 97.2°F | Ht 61.0 in | Wt 187.1 lb

## 2022-05-21 DIAGNOSIS — G8929 Other chronic pain: Secondary | ICD-10-CM

## 2022-05-21 DIAGNOSIS — M25561 Pain in right knee: Secondary | ICD-10-CM

## 2022-05-21 DIAGNOSIS — M0579 Rheumatoid arthritis with rheumatoid factor of multiple sites without organ or systems involvement: Secondary | ICD-10-CM

## 2022-05-21 MED ORDER — TRIAMCINOLONE ACETONIDE 40 MG/ML IJ SUSP
40.0000 mg | Freq: Once | INTRAMUSCULAR | Status: AC
Start: 2022-05-21 — End: 2022-05-21
  Administered 2022-05-21: 40 mg via INTRA_ARTICULAR

## 2022-05-21 NOTE — Addendum Note (Signed)
Addended by: Nanci Pina on: 05/21/2022 10:47 AM   Modules accepted: Orders

## 2022-06-09 IMAGING — DX DG KNEE COMPLETE 4+V*L*
4 series · 4 of 4 positions shown · non-contrast
Comparison: 03/16/2016

CLINICAL DATA: Follow-up arthritis

EXAM:
LEFT KNEE - COMPLETE 4+ VIEW

[knee ap]
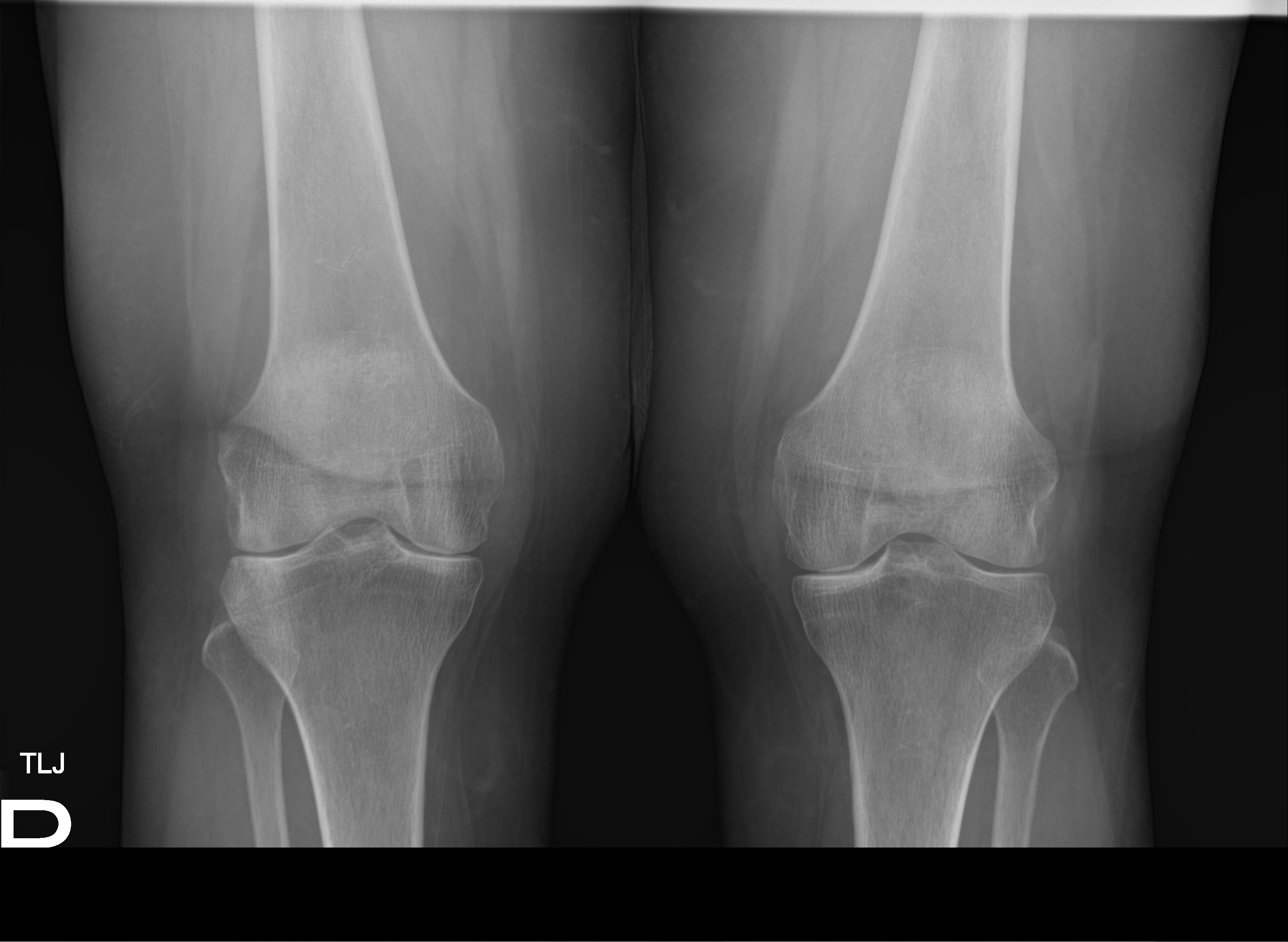

[knee tunnel]
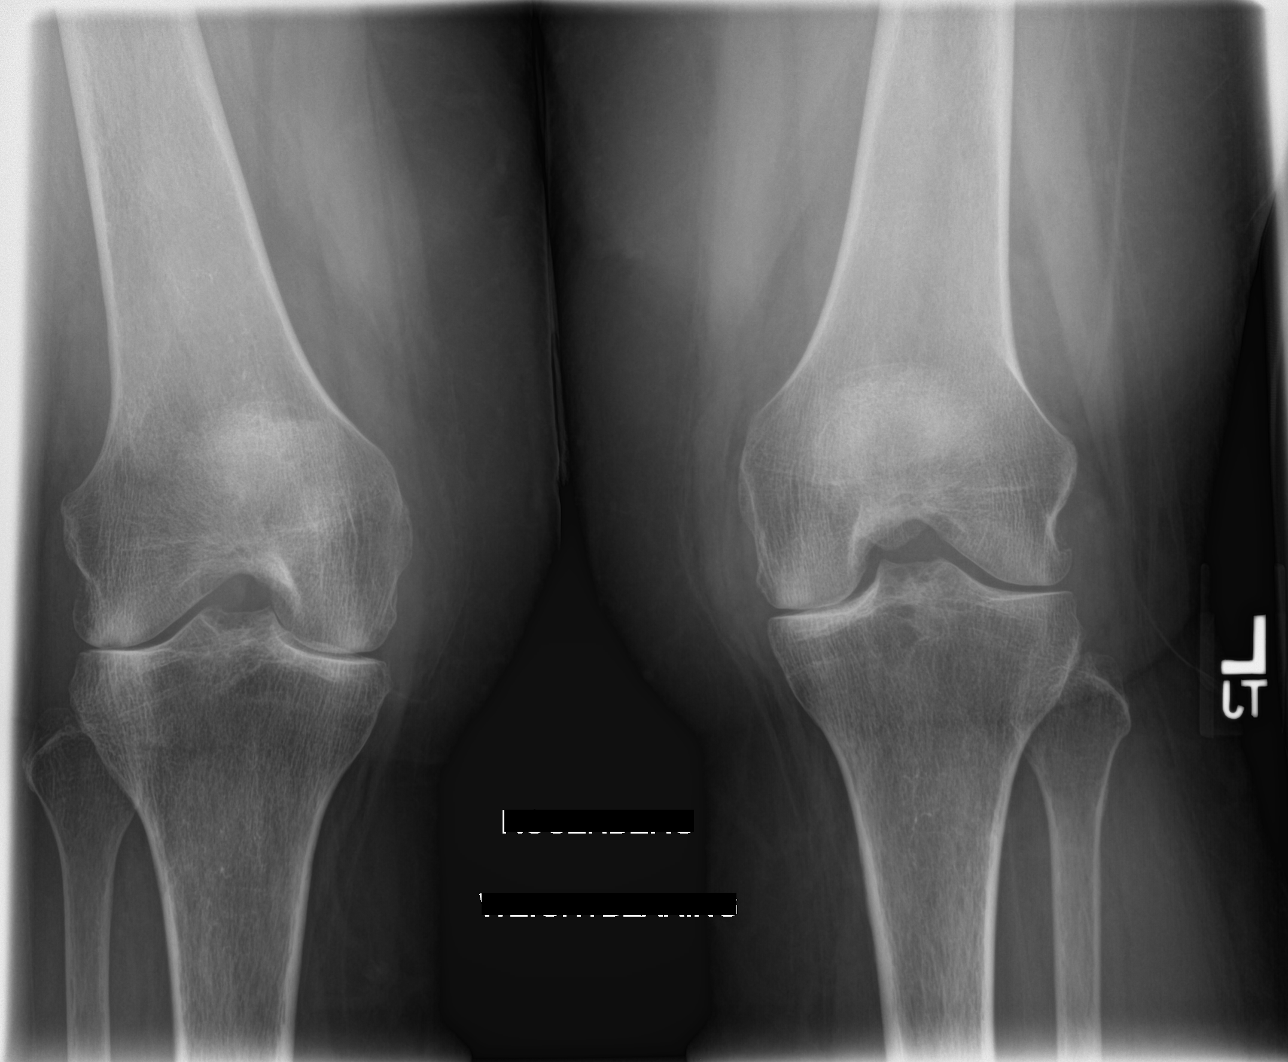

[knee lat]
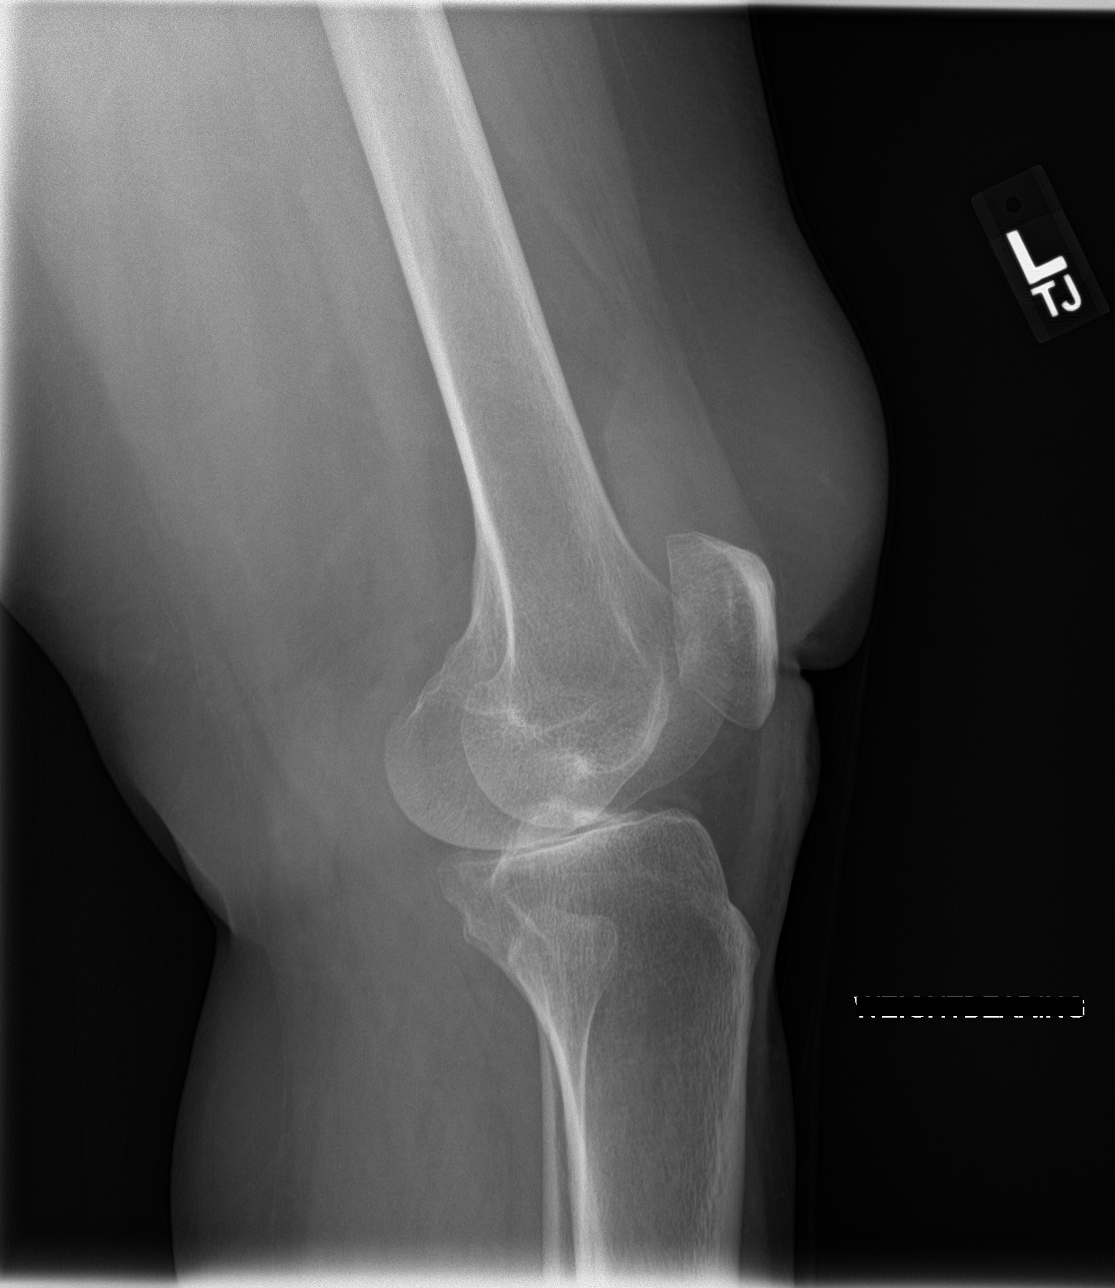

[patella skyline]
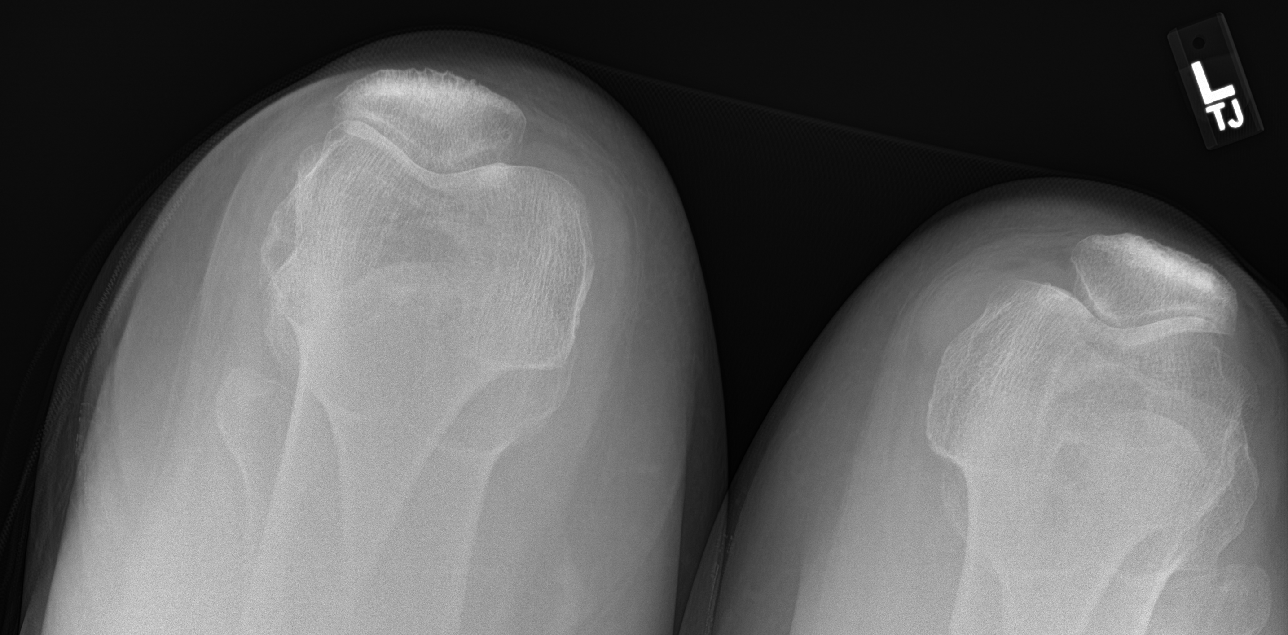

[4 of 4 positions shown; findings below may reference images not displayed]

FINDINGS: No fracture or malalignment. Moderate knee effusion is present.
Moderate medial and lateral joint space narrowing, appears
progressed compared to 5973. Mild patellofemoral degenerative
change, also advanced since 5973.
IMPRESSION: Tricompartmental arthritis with moderate knee effusion. Overall
progression of degenerative change since 5973 radiograph. No acute
osseous abnormality.

## 2022-06-24 ENCOUNTER — Other Ambulatory Visit: Payer: Self-pay | Admitting: Family Medicine

## 2022-07-07 DIAGNOSIS — M069 Rheumatoid arthritis, unspecified: Secondary | ICD-10-CM | POA: Diagnosis not present

## 2022-07-07 DIAGNOSIS — I10 Essential (primary) hypertension: Secondary | ICD-10-CM | POA: Diagnosis not present

## 2022-07-07 DIAGNOSIS — R52 Pain, unspecified: Secondary | ICD-10-CM | POA: Diagnosis not present

## 2022-07-31 ENCOUNTER — Ambulatory Visit
Admission: EM | Admit: 2022-07-31 | Discharge: 2022-07-31 | Disposition: A | Discharge status: Home / Self Care | Payer: Medicare Other | Attending: Nurse Practitioner | Admitting: Nurse Practitioner

## 2022-07-31 DIAGNOSIS — G8929 Other chronic pain: Secondary | ICD-10-CM | POA: Diagnosis not present

## 2022-07-31 DIAGNOSIS — M25562 Pain in left knee: Secondary | ICD-10-CM

## 2022-07-31 DIAGNOSIS — R399 Unspecified symptoms and signs involving the genitourinary system: Secondary | ICD-10-CM

## 2022-07-31 LAB — POCT URINALYSIS DIP (MANUAL ENTRY)
Blood, UA: NEGATIVE
Glucose, UA: NEGATIVE mg/dL
Leukocytes, UA: NEGATIVE
Nitrite, UA: NEGATIVE
Spec Grav, UA: 1.02 (ref 1.010–1.025)
Urobilinogen, UA: 1 E.U./dL
pH, UA: 5.5 (ref 5.0–8.0)

## 2022-07-31 MED ORDER — DEXAMETHASONE SODIUM PHOSPHATE 10 MG/ML IJ SOLN
10.0000 mg | Freq: Once | INTRAMUSCULAR | Status: AC
  Administered 2022-07-31: 10 mg via INTRAMUSCULAR

## 2022-07-31 NOTE — Discharge Instructions (Signed)
We have given you a shot of Decadron today to help with your knee pain.  Please follow-up with your sports medicine physician as you have planned.  May also be beneficial to establish care with a rheumatologist.  The urine test today does not show any acute infection.  Please increase water intake and recommend following up with primary care regarding the urinary symptoms.

## 2022-07-31 NOTE — ED Provider Notes (Signed)
[facility]    CSN: [id number] Arrival date & time: 07/31/22  1036      History   Chief Complaint No chief complaint on file.   HPI Erin Bass is a 62 y.o. female.   Patient presents today with chronic knee pain, worse in the left knee.  She reports history of rheumatoid.  Denies recent fall, accident, trauma, or injury to the knee.  Reports she has been on 11 different medicines for rheumatoid arthritis and none of them have worked well for her.  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  She sees a sports medicine physician for knee injections that she gets every 3 months and has an appointment in 2 weeks.  Reports the knee is swollen and painful.  Is difficult to walk.  No redness, fever, nausea/vomiting.  Patient is also concerned about urinary symptoms ongoing for the past 2 to 3 months.  She reports burning at the very end of urination and foul urinary odor.  No hematuria, urinary frequency or urgency, abdominal pain, nausea/vomiting, flank pain, or vaginal discharge.  Reports she has been taking chlorthalidone daily for blood pressure and stopped taking amlodipine because she thinks it was causing the swelling in her ankle.  She is wondering if she should start back taking the amlodipine because her blood pressure is elevated today in urgent care.  She denies chest pain, shortness of breath, blurred vision or double vision, headaches, and dizziness/lightheadedness.  Patient is currently in between primary care providers.    Past Medical History:  Diagnosis Date   Anemia    Anxiety    Arthritis    HTN (hypertension)    Pneumonia    Tuberculosis    9 months of medication    Patient Active Problem List   Diagnosis Date Noted   Osteoarthritis of left hip 07/17/2021   Primary osteoarthritis of left hip 07/17/2021    Allergic dermatitis 01/31/2021   Rash/skin eruption 01/16/2021   Impetigo 01/16/2021   Pain of left hip joint 11/20/2020   Hyperglycemia 09/12/2020   Cervical spondylosis 04/10/2020   Neck pain 04/03/2020   Bilateral lower extremity edema 06/22/2018   Osteoarthritis of both knees 04/26/2017   TB lung, 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   S/P right THA, 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/2012   Rheumatoid arthritis involving multiple sites with positive rheumatoid factor (HCC) 10/22/2011   Microcytic anemia 05/28/2011   POSTMENOPAUSAL STATUS 03/28/2009   HLD (hyperlipidemia) 01/24/2008   Essential (primary) hypertension 09/06/2006    Past Surgical History:  Procedure Laterality Date   ABDOMINAL HYSTERECTOMY     Partial- Uterus only   bone spur - removal     CESAREAN SECTION     x2   PARTIAL HYSTERECTOMY  01/20/2004   fibroids; ovaries left in    REPLACEMENT TOTAL KNEE Right    TOTAL HIP ARTHROPLASTY Right 12/20/2012   Procedure: RIGHT TOTAL HIP ARTHROPLASTY ANTERIOR APPROACH;  Surgeon: Shelda Pal, MD;  Location: [facility];  Service: Orthopedics;  Laterality: Right;   TOTAL HIP ARTHROPLASTY Left 07/17/2021   Procedure: TOTAL HIP ARTHROPLASTY ANTERIOR APPROACH;  Surgeon: Samson Frederic, MD;  Location: [facility];  Service: Orthopedics;  Laterality: Left;   TUBAL LIGATION      OB History   No obstetric history on file.  Home Medications    Prior to Admission medications   Medication Sig Start Date End Date Taking? Authorizing Provider  amLODipine (NORVASC) 5 MG tablet Take 1 tablet (5 mg total) by mouth daily. 06/13/21   Nche, Bonna Gains, NP  chlorthalidone (HYGROTON) 25 MG tablet Take 0.5 tablets (12.5 mg total) by mouth daily. 07/14/21   Nche, Bonna Gains, NP  DULoxetine (CYMBALTA) 60 MG capsule Take 1 capsule (60 mg total) by mouth daily. 09/09/21   Nche, Bonna Gains, NP  ferrous sulfate 325 (65 FE) MG tablet Take 650 mg by mouth daily with breakfast.     [provider]  meloxicam (MOBIC) 15 MG tablet Take by mouth. Takes 0.5 tablet twice a day    [provider]  Turmeric (QC TUMERIC COMPLEX PO) Take 2 capsules by mouth daily.    [provider]    Family History Family History  Problem Relation Age of Onset   Heart disease Father    Anemia Mother    Depression Other        both sides of family     Social History Social History   Tobacco Use   Smoking status: Never   Smokeless tobacco: Never  Vaping Use   Vaping status: Never Used  Substance Use Topics   Alcohol use: Yes    Comment: occassionally   Drug use: Never     Allergies   Arava [leflunomide], Humira [adalimumab], Methotrexate derivatives, Rinvoq [upadacitinib], and Simponi [golimumab]   Review of Systems Review of Systems Per HPI  Physical Exam Triage Vital Signs ED Triage Vitals  Encounter Vitals Group     BP 07/31/22 1051 (!) 156/104     Systolic BP Percentile --      Diastolic BP Percentile --      Pulse Rate 07/31/22 1051 92     Resp 07/31/22 1051 16     Temp 07/31/22 1051 98.4 F (36.9 C)     Temp Source 07/31/22 1051 Oral     SpO2 07/31/22 1051 95 %     Weight --      Height --      Head Circumference --      Peak Flow --      Pain Score 07/31/22 1054 8     Pain Loc --      Pain Education --      Exclude from Growth Chart --    No data found.  Updated Vital Signs BP (!) 156/104 (BP Location: Left Arm)   Pulse 92   Temp 98.4 F (36.9 C) (Oral)   Resp 16   SpO2 95%   Visual Acuity Right Eye Distance:   Left Eye Distance:   Bilateral Distance:    Right Eye Near:   Left Eye Near:    Bilateral Near:     Physical Exam Vitals and nursing note reviewed.  Constitutional:      General: She is not in acute distress.    Appearance: She is not toxic-appearing.  Pulmonary:     Effort: Pulmonary effort is normal. No respiratory distress.  Abdominal:     General: Abdomen is flat. Bowel sounds are normal.  There is no distension.     Palpations: Abdomen is soft. There is no mass.     Tenderness: There is no abdominal tenderness. There is no right CVA tenderness, left CVA tenderness or guarding.  Musculoskeletal:     Left knee: Swelling and effusion present.     Comments:  Inspection: Left knee joint effusion, no bruising, evidence of formed, redness Palpation: tender to palpation diffusely left knee; no obvious deformities palpated ROM: Difficult to assess secondary to pain and swelling Strength: 5/5 bilateral lower extremities Neurovascular: neurovascularly intact in bilateral lower extremities  Skin:    General: Skin is warm and dry.     Coloration: Skin is not jaundiced or pale.     Findings: No erythema.  Neurological:     Mental Status: She is alert and oriented to person, place, and time.     Motor: No weakness.     Gait: Gait abnormal (using cane, baseline for patient).  Psychiatric:        Behavior: Behavior is cooperative.      UC Treatments / Results  Labs (all labs ordered are listed, but only abnormal results are displayed) Labs Reviewed  POCT URINALYSIS DIP (MANUAL ENTRY) - Abnormal; Notable for the following components:      Result Value   Bilirubin, UA small (*)    Ketones, POC UA trace (5) (*)    Protein Ur, POC trace (*)    All other components within normal limits    EKG   Radiology No results found.  Procedures Procedures (including critical care time)  Medications Ordered in UC Medications  dexamethasone (DECADRON) injection 10 mg (has no administration in time range)    Initial Impression / Assessment and Plan / UC Course  I have reviewed the triage vital signs and the nursing notes.  Pertinent labs & imaging results that were available during my care of the patient were reviewed by me and considered in my medical decision making (see chart for details).   Patient is well-appearing, afebrile, not tachycardic, not tachypneic, oxygenating well on  room air.  Patient is mildly hypertensive in urgent care today.  1. Chronic pain of left knee No red flags in history or on exam today Decadron 10 mg IM given today in urgent care Discussed need to follow-up with primary as well as sports medicine specialist with ongoing pain  2. Urinary symptom or sign Urinalysis today with trace ketones, trace protein No signs of UTI discussed with with the patient Recommended increasing water intake, follow-up with PCP if symptoms persist or worsen despite treatment  The patient was given the opportunity to ask questions.  All questions answered to their satisfaction.  The patient is in agreement to this plan.    Final Clinical Impressions(s) / UC Diagnoses   Final diagnoses:  Chronic pain of left knee  Urinary symptom or sign     Discharge Instructions      We have given you a shot of Decadron today to help with your knee pain.  Please follow-up with your sports medicine physician as you have planned.  May also be beneficial to establish care with a rheumatologist.  The urine test today does not show any acute infection.  Please increase water intake and recommend following up with primary care regarding the urinary symptoms.   ED Prescriptions   None    PDMP not reviewed this encounter.   Valentino Nose, NP 07/31/22 [phone]

## 2022-07-31 NOTE — ED Triage Notes (Addendum)
Pt presents to UC w/ c/o left knee swelling and pain x3 months. Pt reports bilateral knee pain, but worse on the left. Also c/o right ankle swelling and pain. Denies falling or direct injury. Has been taking tylenol for relief.  Pt c/o burning on urination and a "strong ammonia smell"

## 2022-08-09 NOTE — Progress Notes (Signed)
Erin Dykstra T. Sammie Denner, MD, CAQ Sports Medicine [facility] at [facility] [address] [place], [address]  Phone: [phone]  FAX: [phone]  Erin Bass - 62 y.o. female  MRN [id number]  Date of Birth: 09/10/1960  Date: 08/12/2022  PCP: Rosita Fire, NP  Referral: Rosita Fire, NP  Chief Complaint  Patient presents with   Knee Pain    Seen at [facility] 07/31/2022   Subjective:   Erin Bass is a 62 y.o. very pleasant female patient with Body mass index is 35.59 kg/m. who presents with the following:  She is a pleasant patient who has rheumatoid arthritis that has been very difficult to manage with DMARDs.  She has seen rheumatology over time, and was tried on roughly 10 different DMARDs without success.  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  She recently went to urgent care roughly 2 weeks ago, and they gave her 10 mg of Decadron IM.  R knee is also painful, s/p TKR.  Right now her left knee is bothering her quite a bit, it is mildly swollen.  She has pain mostly in the medial joint line, she has pain globally, as well.  She has not any recent injury or trauma.  Inj L Knee  Review of Systems is noted in the HPI, as appropriate  Objective:   BP (!) 150/80 (BP Location: Left Arm, Patient Position: Sitting, Cuff Size: Large)   Pulse 93   Temp 98.1 F (36.7 C) (Temporal)   Ht 5\' 1"  (1.549 m)   Wt 188 lb 6 oz (85.4 kg)   SpO2 96%   BMI 35.59 kg/m   GEN: No acute distress; alert,appropriate. PULM: Breathing comfortably in no respiratory distress PSYCH: Normally interactive.   Left knee: Full extension and flexion to 95 degrees.  She has a mild effusion.  Minimal motion at the patellar femoral joint.  Stable to varus and valgus stress.  ACL intact. She does have significant medial lateral joint line tenderness.  Laboratory and  Imaging Data:  Assessment and Plan:     ICD-10-CM   1. Rheumatoid arthritis involving multiple sites with positive rheumatoid factor (HCC)  M05.79     2. Primary osteoarthritis of left knee  M17.12      Acute on chronic rheumatoid arthritis with exacerbation and flareup of left-sided knee osteoarthritis and rheumatoid arthritis.  Acute on chronic with exacerbation.  She is to continue conservative basic care including ice and NSAIDs.  Will do a left-sided knee injection today to try to calm down her symptoms.  Aspiration/Injection Procedure Note Erin Bass 07-Jan-1961 Date of procedure: 08/12/2022  Procedure: Large Joint Aspiration / Injection of Knee, L Indications: Pain  Procedure Details Patient verbally consented to procedure. Risks, benefits, and alternatives explained. Sterilely prepped with Chloraprep. Ethyl cholride used for anesthesia. 9 cc Lidocaine 1% mixed with 1 mL of Kenalog 40 mg injected using the anteromedial approach without difficulty. No complications with procedure and tolerated well. Patient had decreased pain post-injection. Medication: 1 mL of Kenalog 40 mg   Medication Management during today's office visit: No orders of the defined types were placed in this encounter.  There are no discontinued medications.  Orders placed today for conditions managed today: No orders of the defined types were placed in this encounter.   Disposition: No follow-ups on file.  [employer] speech-to-text software was used  for transcription in this dictation.  Possible transcriptional errors can occur using Animal nutritionist.   Signed,  Erin Bass. Erin Golla, MD   Outpatient Encounter Medications as of 08/12/2022  Medication Sig   chlorthalidone (HYGROTON) 25 MG tablet Take 0.5 tablets (12.5 mg total) by mouth daily.   DULoxetine (CYMBALTA) 60 MG capsule Take 1 capsule (60 mg total) by mouth daily.   ferrous sulfate 325 (65 FE) MG tablet Take 650 mg by mouth  daily with breakfast.   meloxicam (MOBIC) 15 MG tablet Take by mouth. Takes 0.5 tablet twice a day   Turmeric (QC TUMERIC COMPLEX PO) Take 2 capsules by mouth daily.   amLODipine (NORVASC) 5 MG tablet Take 1 tablet (5 mg total) by mouth daily. (Patient not taking: Reported on 08/12/2022)   No facility-administered encounter medications on file as of 08/12/2022.

## 2022-08-12 ENCOUNTER — Encounter: Payer: Self-pay | Admitting: Family Medicine

## 2022-08-12 ENCOUNTER — Ambulatory Visit (INDEPENDENT_AMBULATORY_CARE_PROVIDER_SITE_OTHER): Payer: Medicare Other | Admitting: Family Medicine

## 2022-08-12 VITALS — BP 150/80 | HR 93 | Temp 98.1°F | Ht 61.0 in | Wt 188.4 lb

## 2022-08-12 DIAGNOSIS — M1712 Unilateral primary osteoarthritis, left knee: Secondary | ICD-10-CM

## 2022-08-12 DIAGNOSIS — M0579 Rheumatoid arthritis with rheumatoid factor of multiple sites without organ or systems involvement: Secondary | ICD-10-CM

## 2022-08-12 MED ORDER — TRIAMCINOLONE ACETONIDE 40 MG/ML IJ SUSP
40.0000 mg | Freq: Once | INTRAMUSCULAR | Status: AC
Start: 2022-08-12 — End: 2022-08-12
  Administered 2022-08-12: 40 mg via INTRA_ARTICULAR

## 2022-08-12 NOTE — Addendum Note (Signed)
Addended by: Damita Lack on: 08/12/2022 03:37 PM   Modules accepted: Orders

## 2022-08-12 NOTE — Patient Instructions (Signed)
Dr. Mayford Knife in [place] - check him out  If you don't like him then:  In our office:  Dr. Roxy Manns Dr. Kerby Nora Mort Sawyers, FNP

## 2022-09-03 DIAGNOSIS — I10 Essential (primary) hypertension: Secondary | ICD-10-CM | POA: Diagnosis not present

## 2022-09-03 DIAGNOSIS — M0579 Rheumatoid arthritis with rheumatoid factor of multiple sites without organ or systems involvement: Secondary | ICD-10-CM | POA: Diagnosis not present

## 2022-09-03 DIAGNOSIS — D509 Iron deficiency anemia, unspecified: Secondary | ICD-10-CM | POA: Diagnosis not present

## 2022-09-14 DIAGNOSIS — I10 Essential (primary) hypertension: Secondary | ICD-10-CM | POA: Diagnosis not present

## 2022-09-14 DIAGNOSIS — Z79899 Other long term (current) drug therapy: Secondary | ICD-10-CM | POA: Diagnosis not present

## 2022-09-27 ENCOUNTER — Other Ambulatory Visit: Payer: Self-pay | Admitting: Nurse Practitioner

## 2022-09-30 DIAGNOSIS — Z79899 Other long term (current) drug therapy: Secondary | ICD-10-CM | POA: Diagnosis not present

## 2022-09-30 DIAGNOSIS — Z79620 Long term (current) use of immunosuppressive biologic: Secondary | ICD-10-CM | POA: Diagnosis not present

## 2022-09-30 DIAGNOSIS — M0608 Rheumatoid arthritis without rheumatoid factor, vertebrae: Secondary | ICD-10-CM | POA: Diagnosis not present

## 2022-09-30 DIAGNOSIS — M79642 Pain in left hand: Secondary | ICD-10-CM | POA: Diagnosis not present

## 2022-09-30 DIAGNOSIS — Z5181 Encounter for therapeutic drug level monitoring: Secondary | ICD-10-CM | POA: Diagnosis not present

## 2022-09-30 DIAGNOSIS — M0579 Rheumatoid arthritis with rheumatoid factor of multiple sites without organ or systems involvement: Secondary | ICD-10-CM | POA: Diagnosis not present

## 2022-09-30 DIAGNOSIS — M069 Rheumatoid arthritis, unspecified: Secondary | ICD-10-CM | POA: Diagnosis not present

## 2022-09-30 DIAGNOSIS — S63001A Unspecified subluxation of right wrist and hand, initial encounter: Secondary | ICD-10-CM | POA: Diagnosis not present

## 2022-09-30 DIAGNOSIS — M79641 Pain in right hand: Secondary | ICD-10-CM | POA: Diagnosis not present

## 2022-10-27 DIAGNOSIS — Z79620 Long term (current) use of immunosuppressive biologic: Secondary | ICD-10-CM | POA: Diagnosis not present

## 2022-10-27 DIAGNOSIS — Z79899 Other long term (current) drug therapy: Secondary | ICD-10-CM | POA: Diagnosis not present

## 2022-10-27 DIAGNOSIS — M0579 Rheumatoid arthritis with rheumatoid factor of multiple sites without organ or systems involvement: Secondary | ICD-10-CM | POA: Diagnosis not present

## 2022-10-27 DIAGNOSIS — Z5181 Encounter for therapeutic drug level monitoring: Secondary | ICD-10-CM | POA: Diagnosis not present

## 2022-11-10 ENCOUNTER — Other Ambulatory Visit: Payer: Self-pay | Admitting: Nurse Practitioner

## 2022-11-10 DIAGNOSIS — G894 Chronic pain syndrome: Secondary | ICD-10-CM

## 2022-11-13 DIAGNOSIS — Z5181 Encounter for therapeutic drug level monitoring: Secondary | ICD-10-CM | POA: Diagnosis not present

## 2022-11-13 DIAGNOSIS — Z79899 Other long term (current) drug therapy: Secondary | ICD-10-CM | POA: Diagnosis not present

## 2022-11-13 DIAGNOSIS — M0579 Rheumatoid arthritis with rheumatoid factor of multiple sites without organ or systems involvement: Secondary | ICD-10-CM | POA: Diagnosis not present

## 2022-11-13 DIAGNOSIS — Z79620 Long term (current) use of immunosuppressive biologic: Secondary | ICD-10-CM | POA: Diagnosis not present

## 2022-12-15 DIAGNOSIS — I10 Essential (primary) hypertension: Secondary | ICD-10-CM | POA: Diagnosis not present

## 2022-12-15 DIAGNOSIS — M0579 Rheumatoid arthritis with rheumatoid factor of multiple sites without organ or systems involvement: Secondary | ICD-10-CM | POA: Diagnosis not present

## 2022-12-15 DIAGNOSIS — M1712 Unilateral primary osteoarthritis, left knee: Secondary | ICD-10-CM | POA: Diagnosis not present

## 2022-12-30 DIAGNOSIS — Z5181 Encounter for therapeutic drug level monitoring: Secondary | ICD-10-CM | POA: Diagnosis not present

## 2022-12-30 DIAGNOSIS — M0579 Rheumatoid arthritis with rheumatoid factor of multiple sites without organ or systems involvement: Secondary | ICD-10-CM | POA: Diagnosis not present

## 2022-12-30 DIAGNOSIS — Z79620 Long term (current) use of immunosuppressive biologic: Secondary | ICD-10-CM | POA: Diagnosis not present

## 2022-12-30 DIAGNOSIS — Z79899 Other long term (current) drug therapy: Secondary | ICD-10-CM | POA: Diagnosis not present

## 2023-10-19 IMAGING — DX DG CHEST 2V
2 series · 2 of 2 positions shown · non-contrast
Comparison: 5311

CLINICAL DATA: Productive cough > 2 weeks, temp

EXAM:
CHEST - 2 VIEW

[chest pa]
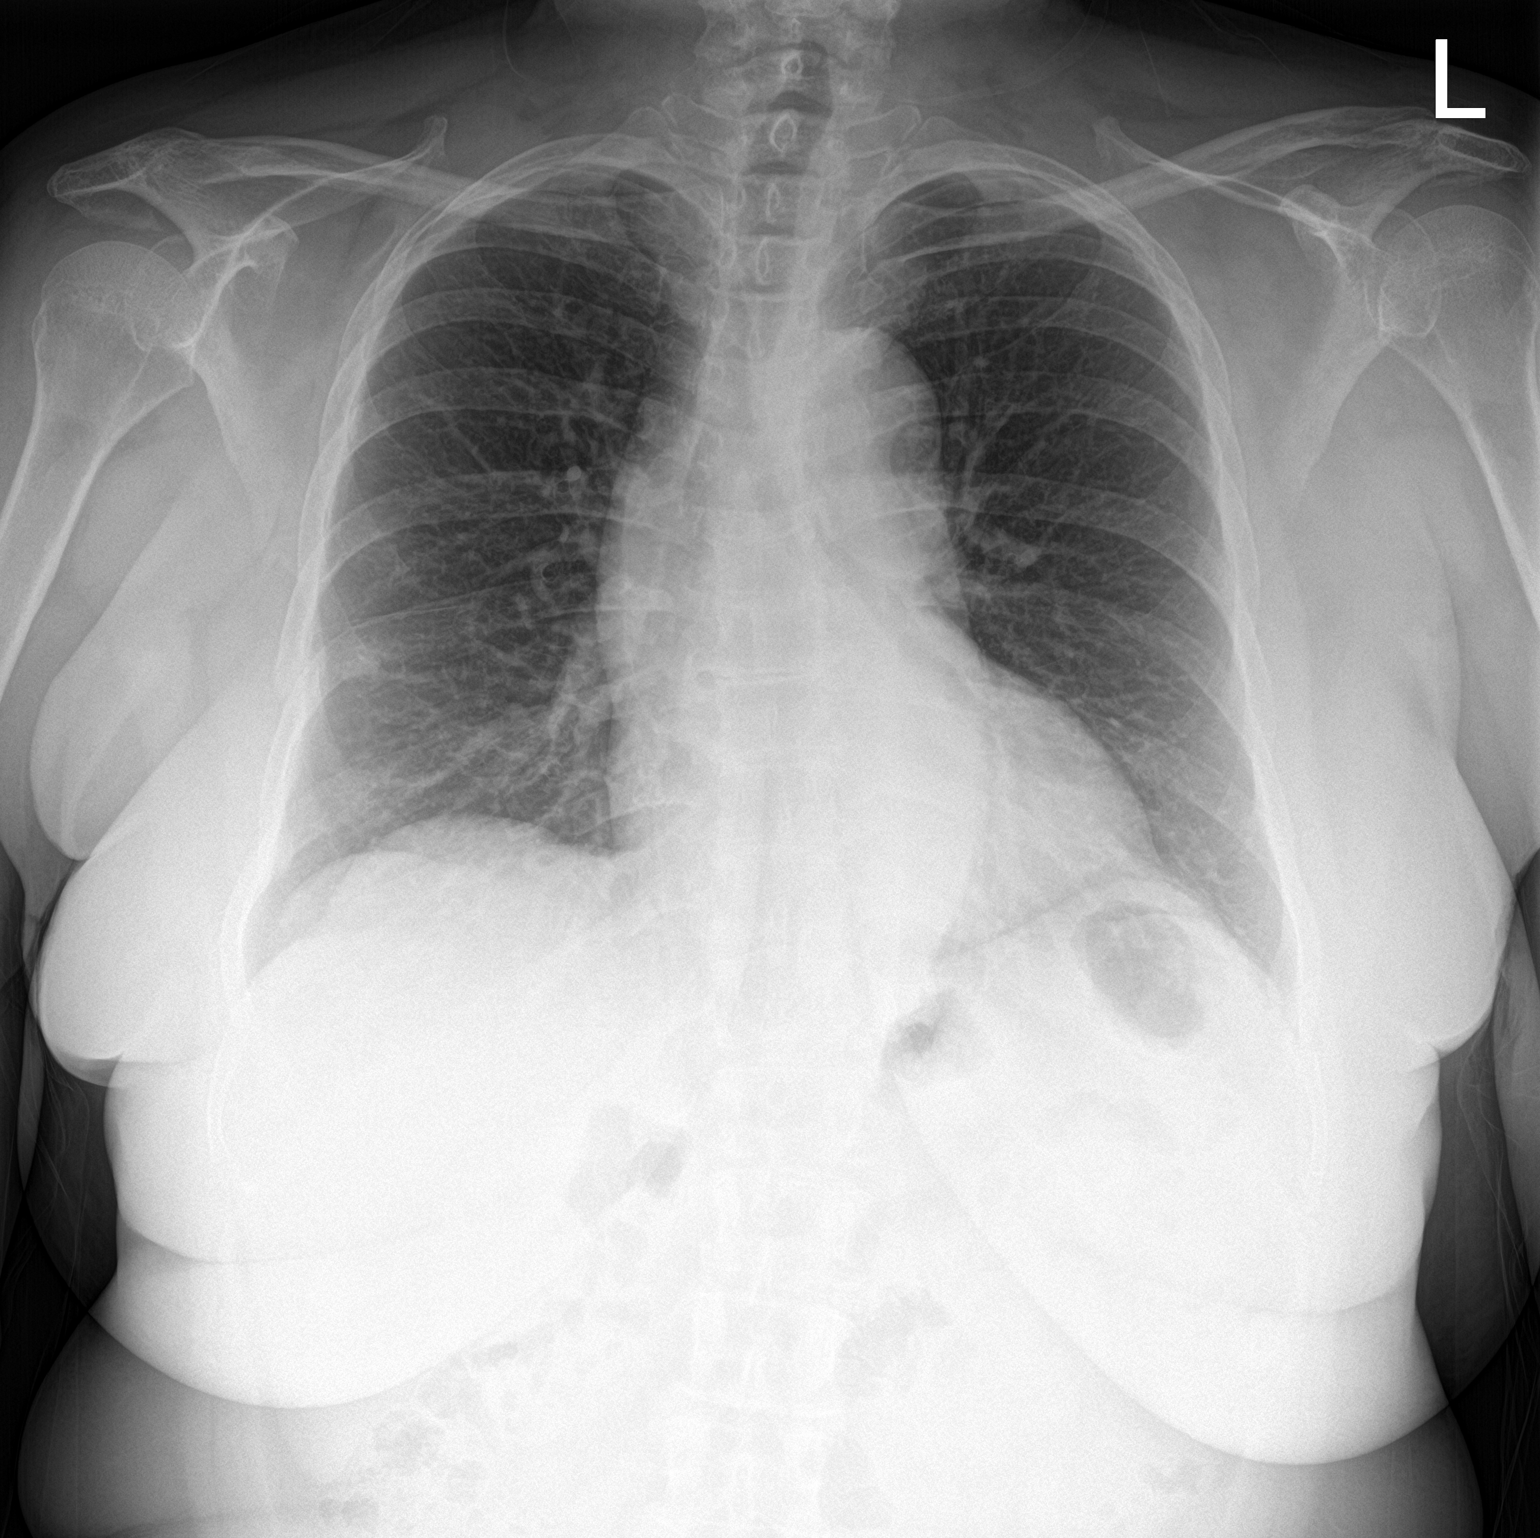

[chest lat]
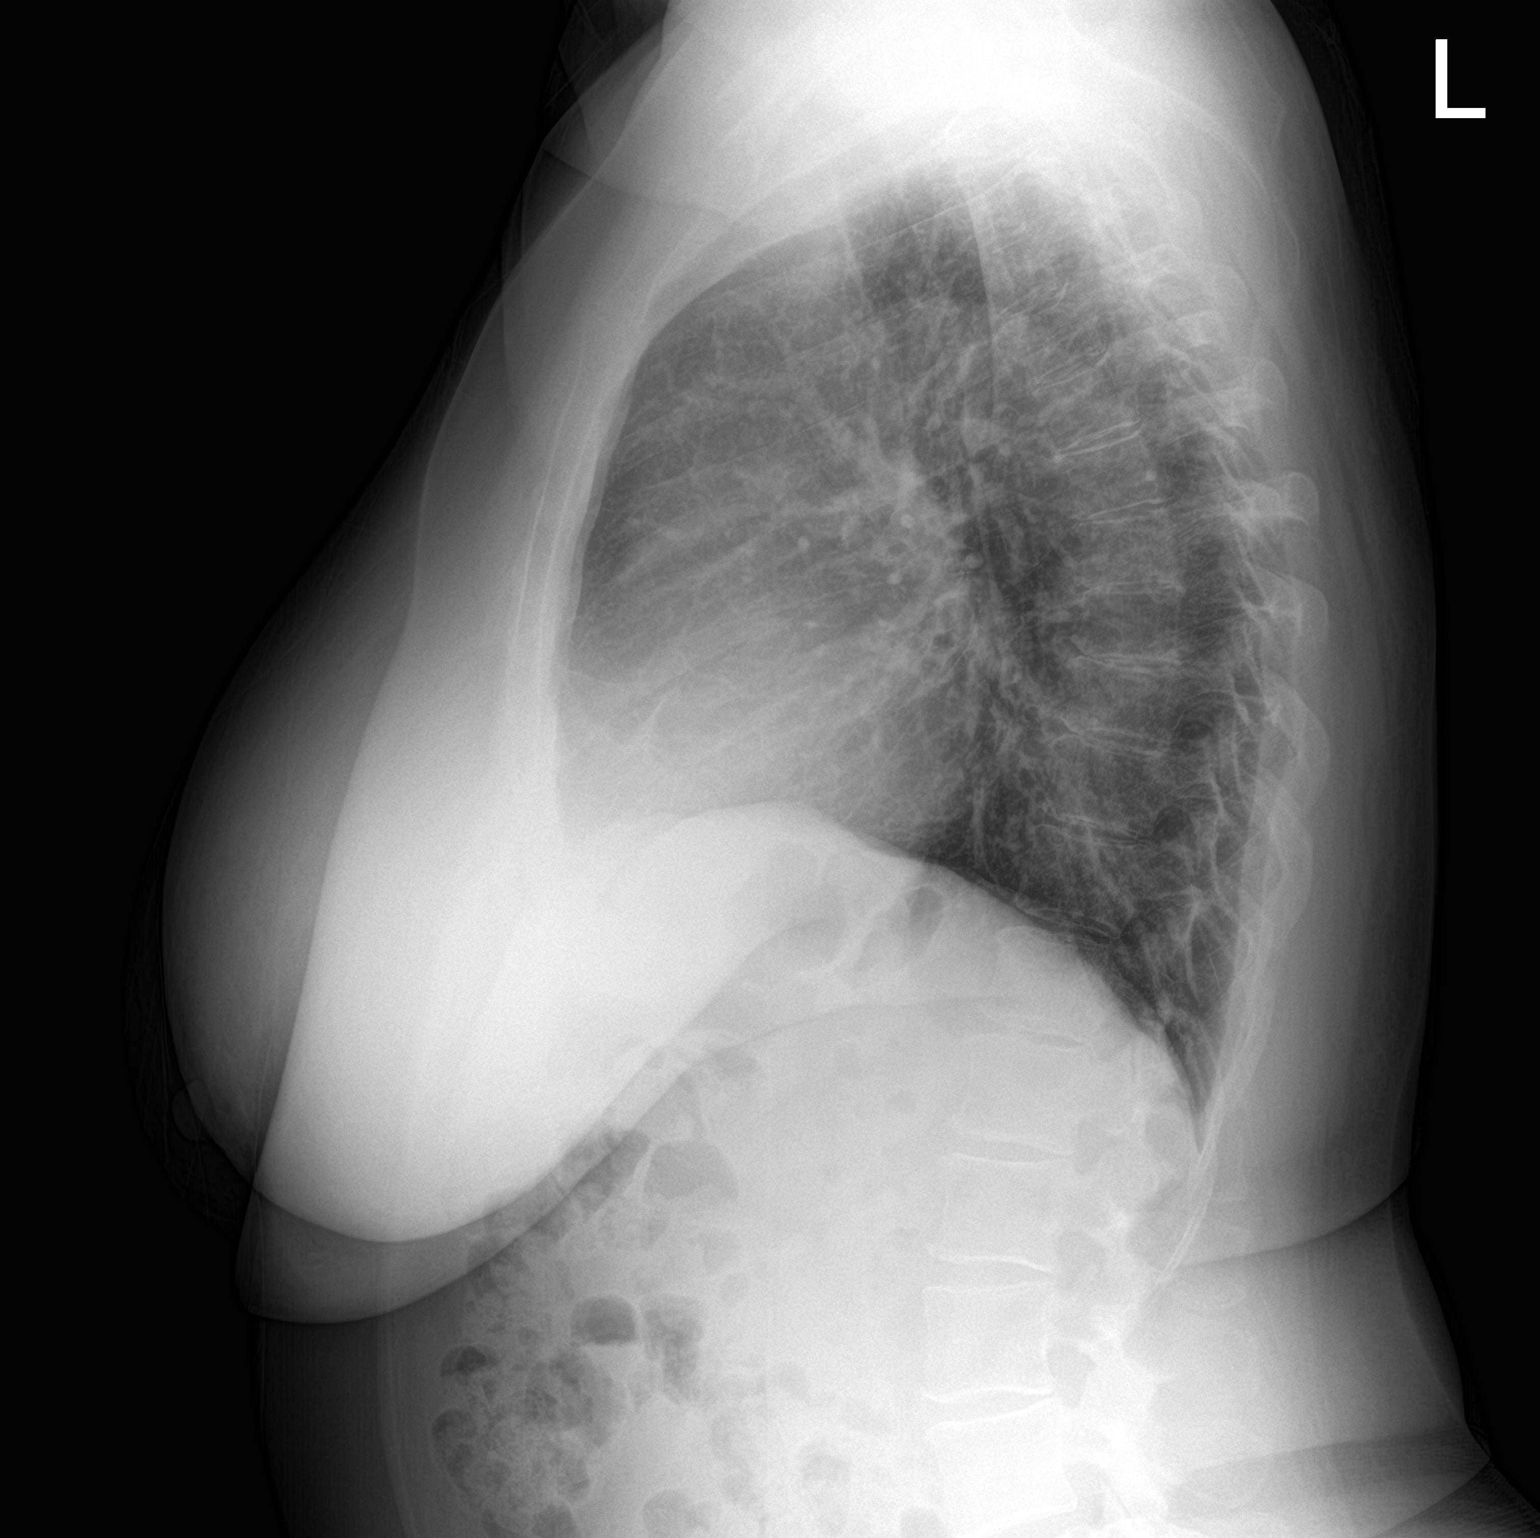

[2 of 2 positions shown; findings below may reference images not displayed]

FINDINGS: The heart size and mediastinal contours are within normal limits.
Both lungs are clear. No pleural effusion. The visualized skeletal
structures are unremarkable.
IMPRESSION: No active cardiopulmonary disease.
